# Patient Record
Sex: Male | Born: 1945
Health system: Southern US, Community
[De-identification: ages and names within clinical notes are randomized; demographics above are authoritative.]

## PROBLEM LIST (undated history)

## (undated) DIAGNOSIS — M7989 Other specified soft tissue disorders: Secondary | ICD-10-CM

## (undated) DIAGNOSIS — R0602 Shortness of breath: Secondary | ICD-10-CM

## (undated) DIAGNOSIS — I10 Essential (primary) hypertension: Secondary | ICD-10-CM

## (undated) DIAGNOSIS — M25551 Pain in right hip: Secondary | ICD-10-CM

## (undated) DIAGNOSIS — E119 Type 2 diabetes mellitus without complications: Secondary | ICD-10-CM

## (undated) DIAGNOSIS — T8859XA Other complications of anesthesia, initial encounter: Secondary | ICD-10-CM

## (undated) DIAGNOSIS — I499 Cardiac arrhythmia, unspecified: Secondary | ICD-10-CM

## (undated) DIAGNOSIS — R609 Edema, unspecified: Secondary | ICD-10-CM

## (undated) DIAGNOSIS — M1990 Unspecified osteoarthritis, unspecified site: Secondary | ICD-10-CM

## (undated) DIAGNOSIS — M109 Gout, unspecified: Secondary | ICD-10-CM

## (undated) DIAGNOSIS — I491 Atrial premature depolarization: Secondary | ICD-10-CM

## (undated) DIAGNOSIS — M75100 Unspecified rotator cuff tear or rupture of unspecified shoulder, not specified as traumatic: Secondary | ICD-10-CM

## (undated) DIAGNOSIS — I2510 Atherosclerotic heart disease of native coronary artery without angina pectoris: Secondary | ICD-10-CM

## (undated) DIAGNOSIS — G8929 Other chronic pain: Secondary | ICD-10-CM

## (undated) DIAGNOSIS — T4145XA Adverse effect of unspecified anesthetic, initial encounter: Secondary | ICD-10-CM

## (undated) DIAGNOSIS — E1165 Type 2 diabetes mellitus with hyperglycemia: Secondary | ICD-10-CM

## (undated) DIAGNOSIS — E785 Hyperlipidemia, unspecified: Secondary | ICD-10-CM

## (undated) HISTORY — DX: Pain in right hip: M25.551

## (undated) HISTORY — DX: Adverse effect of unspecified anesthetic, initial encounter: T41.45XA

## (undated) HISTORY — DX: Type 2 diabetes mellitus without complications: E11.9

## (undated) HISTORY — PX: BACK SURGERY: SHX140

## (undated) HISTORY — DX: Atrial premature depolarization: I49.1

## (undated) HISTORY — DX: Other complications of anesthesia, initial encounter: T88.59XA

## (undated) HISTORY — PX: ELBOW BURSA SURGERY: SHX615

## (undated) HISTORY — DX: Essential (primary) hypertension: I10

## (undated) HISTORY — PX: CARDIAC CATHETERIZATION: SHX172

## (undated) HISTORY — DX: Edema, unspecified: R60.9

## (undated) HISTORY — DX: Other chronic pain: G89.29

## (undated) HISTORY — DX: Other specified soft tissue disorders: M79.89

## (undated) HISTORY — DX: Type 2 diabetes mellitus with hyperglycemia: E11.65

## (undated) HISTORY — DX: Gout, unspecified: M10.9

## (undated) HISTORY — DX: Hyperlipidemia, unspecified: E78.5

---

## 1999-11-13 ENCOUNTER — Ambulatory Visit (HOSPITAL_COMMUNITY): Admission: RE | Admit: 1999-11-13 | Discharge: 1999-11-13 | Payer: Self-pay | Admitting: Internal Medicine

## 2000-03-25 ENCOUNTER — Other Ambulatory Visit: Admission: RE | Admit: 2000-03-25 | Discharge: 2000-03-25 | Payer: Self-pay | Admitting: Obstetrics and Gynecology

## 2000-06-17 ENCOUNTER — Other Ambulatory Visit: Admission: RE | Admit: 2000-06-17 | Discharge: 2000-06-17 | Payer: Self-pay | Admitting: *Deleted

## 2001-07-09 ENCOUNTER — Inpatient Hospital Stay (HOSPITAL_COMMUNITY): Admission: EM | Admit: 2001-07-09 | Discharge: 2001-07-11 | Payer: Self-pay

## 2001-09-08 ENCOUNTER — Encounter: Payer: Self-pay | Admitting: Internal Medicine

## 2001-09-08 ENCOUNTER — Encounter: Admission: RE | Admit: 2001-09-08 | Discharge: 2001-09-08 | Payer: Self-pay | Admitting: Obstetrics and Gynecology

## 2002-01-22 ENCOUNTER — Ambulatory Visit (HOSPITAL_COMMUNITY): Admission: RE | Admit: 2002-01-22 | Discharge: 2002-01-22 | Payer: Self-pay | Admitting: Internal Medicine

## 2005-10-16 ENCOUNTER — Ambulatory Visit: Payer: Self-pay

## 2005-11-22 ENCOUNTER — Ambulatory Visit: Payer: Self-pay | Admitting: Cardiology

## 2007-06-03 ENCOUNTER — Ambulatory Visit: Payer: Self-pay | Admitting: Gastroenterology

## 2007-06-17 ENCOUNTER — Encounter: Payer: Self-pay | Admitting: Gastroenterology

## 2007-06-17 ENCOUNTER — Ambulatory Visit: Payer: Self-pay | Admitting: Gastroenterology

## 2011-03-22 NOTE — Discharge Summary (Signed)
[name]. [facility]  Patient:    Oscar [name], Oscar [name] Visit Number: [id number] MRN: [id number]          Service Type: MED Location: [phone] Attending Physician:  Learta Codding Dictated by:   Abelino Derrick, P.A.C. [facility] Admit Date:  07/09/2001 Discharge Date: 07/11/2001   CC:         Erskine Speed, M.D.   Referring Physician Discharge [place]  DISCHARGE DIAGNOSES: 1. Acute coronary syndrome, catheterization this admission. 2. Treated hypertension. 3. Non-insulin-dependent diabetes. 4. Treated hyperlipidemia. 5. History of gout.  HOSPITAL COURSE:  The patient is a 65 year old male with no prior history of coronary disease who presented July 09, 2001 with chest pain consistent with acute coronary syndrome.  Troponin was slightly positive at 0.22; CKs were 273 with 4.7 MBs.  Patient was admitted to telemetry, started on Protonix, aspirin, Lovenox, and beta blocker, and set up for a catheterization.  This was done July 10, 2001 by Dr. Gerri Spore.  This revealed a 50-60% RCA, 40% circumflex, 40% LAD.  LV function was normal.  Dr. Gerri Spore felt he had moderate diffuse coronary disease with no obstruction, no culprit lesions identified.  Plan is for aggressive medical therapy.  On September 7 he is up and ambulating, pain free.  His troponin is 0.1.  He will be discharged today and follow up with Dr. Andee Lineman in the office in about two weeks.  DISCHARGE MEDICATIONS: 1. Glucophage 500 mg b.i.d. to start Sunday. 2. Lopressor 25 mg b.i.d. 3. Protonix 40 mg a day. 4. Colchicine 0.6 q.d. 5. Coated aspirin q.d. 6. Altace 2.5 q.d. 7. Lipitor 20 mg a day. 8. Nitroglycerin sublingual p.r.n. 9. Plavix - duration to be determined by Dr. Andee Lineman.  LABORATORY DATA:  CK-MB and troponins are as noted.  Sodium 141, potassium 3.7, BUN 10, creatinine 1.2.  D-dimer 0.27.  INR 1.1.  White count 4.9, hemoglobin 16.3, hematocrit 48.2, platelets 166.  EKG shows sinus  rhythm, left anterior fascicular block, nonspecific changes.  DISPOSITION:  Patient is discharged in stable condition and will follow up with Dr. Andee Lineman. Dictated by:   Abelino Derrick, P.A.C. [facility] Attending Physician:  Learta Codding DD:  07/11/01 TD:  07/11/01 Job: [id number]

## 2011-03-22 NOTE — Cardiovascular Report (Signed)
[place]. [facility]  Patient:    Oscar [name], Oscar [name] Visit Number: [id number] MRN: [id number]          Service Type: MED Location: [phone] Attending Physician:  Learta Codding Dictated by:   Daisey Must, M.D. [facility] Proc. Date: 07/10/01 Admit Date:  07/09/2001   CC:         Erskine Speed, M.D.  Lewayne Bunting, M.D. [facility]  Cardiac Catheterization Laboratory   Cardiac Catheterization  PROCEDURES PERFORMED:  Left heart catheterization with coronary angiography and left ventriculography.  INDICATIONS:  The patient is a 65 year old male with diabetes and hypercholesterolemia.  He presented to the hospital with chest pain.  His cardiac enzymes showed borderline elevation troponin levels. He was referred for cardiac catheterization.  DESCRIPTION OF PROCEDURE:  A 6 French sheath was placed in the right femoral artery.  Standard Judkins 6 French catheters were utilized.  Contrast was Omnipaque.  There were no complications.  RESULTS:  HEMODYNAMICS:  Left ventricular pressure 122/16.  Aortic pressure 122/88. There was no aortic valve gradient.  LEFT VENTRICULOGRAM:  Wall motion is normal.  Ejection fraction was calculated at 63%.  There is no mitral regurgitation.  CORONARY ARTERIOGRAPHY:  (Codominant).  Left main:  Normal.  Left anterior descending:  The left anterior descending artery has a 30% stenosis at the ostium.  In the mid vessel just after the origin of the large first diagonal branch there is a 50% stenosis.  Further down in the mid vessel is a tubular 40% stenosis.  The distal LAD has a diffuse 25% stenosis and the apical LAD had a 70% stenosis.  There is a large first diagonal branch which has a 40% stenosis at its origin and a 50% stenosis in a small branch.  Left circumflex:  The left circumflex is a codominant vessel.  There is a 40% stenosis in the mid vessel, 40% in the mid to distal vessel, and a diffuse 20% stenosis in the very  distal vessel before the second posterolateral branch. There is a small OM-1, normal sized OM-2 and normal sized OM-3 arising from the circumflex.  OM-2 has a 30% stenosis proximally.  OM-3 has a 50% stenosis at its origin.  The distal left circumflex gives rise to two small to normal sized posterolateral branches.  Right coronary artery:  The right coronary artery is a codominant vessel. There is a diffuse 50% stenosis in the mid vessel and a tubular 60% stenosis in the distal vessel.  The distal right coronary artery gives rise to a normal sized posterior descending artery.  Of note, there was somewhat delayed antegrade flow in both left and right coronary system.  Intracoronary verapamil was administered in the right coronary artery which did improve flow in the mid to distal right coronary artery.  This suggest a possibility of small vessel disease.  IMPRESSIONS: 1. Normal left ventricular systolic function. 2. Moderate two-vessel coronary artery disease as described with diffusely    disease coronaries but no clearly obstructive lesions identified.  RECOMMENDATIONS:  Medical therapy with aggressive risk factor modification. Dictated by:   Daisey Must, M.D. [facility] Attending Physician:  Learta Codding DD:  07/10/01 TD:  07/11/01 Job: [id number]

## 2012-07-07 ENCOUNTER — Ambulatory Visit
Admission: RE | Admit: 2012-07-07 | Discharge: 2012-07-07 | Disposition: A | Discharge status: Home / Self Care | Payer: BC Managed Care – PPO | Source: Ambulatory Visit | Attending: Orthopedic Surgery | Admitting: Orthopedic Surgery

## 2012-07-07 ENCOUNTER — Other Ambulatory Visit: Payer: Self-pay | Admitting: Orthopedic Surgery

## 2012-07-07 DIAGNOSIS — M25551 Pain in right hip: Secondary | ICD-10-CM

## 2012-07-07 DIAGNOSIS — M5430 Sciatica, unspecified side: Secondary | ICD-10-CM

## 2012-07-17 ENCOUNTER — Other Ambulatory Visit: Payer: Self-pay | Admitting: Orthopedic Surgery

## 2012-07-17 DIAGNOSIS — M545 Low back pain, unspecified: Secondary | ICD-10-CM

## 2012-07-23 ENCOUNTER — Ambulatory Visit
Admission: RE | Admit: 2012-07-23 | Discharge: 2012-07-23 | Disposition: A | Discharge status: Home / Self Care | Payer: BC Managed Care – PPO | Source: Ambulatory Visit | Attending: Orthopedic Surgery | Admitting: Orthopedic Surgery

## 2012-07-23 DIAGNOSIS — M545 Low back pain, unspecified: Secondary | ICD-10-CM

## 2012-08-12 ENCOUNTER — Encounter: Payer: Self-pay | Admitting: Gastroenterology

## 2012-08-18 ENCOUNTER — Other Ambulatory Visit: Payer: Self-pay | Admitting: Orthopedic Surgery

## 2012-08-18 DIAGNOSIS — M549 Dorsalgia, unspecified: Secondary | ICD-10-CM

## 2012-08-19 ENCOUNTER — Other Ambulatory Visit: Payer: Self-pay | Admitting: Orthopedic Surgery

## 2012-08-19 ENCOUNTER — Other Ambulatory Visit: Payer: BC Managed Care – PPO

## 2012-08-19 ENCOUNTER — Ambulatory Visit
Admission: RE | Admit: 2012-08-19 | Discharge: 2012-08-19 | Disposition: A | Discharge status: Home / Self Care | Payer: BC Managed Care – PPO | Source: Ambulatory Visit | Attending: Orthopedic Surgery | Admitting: Orthopedic Surgery

## 2012-08-19 VITALS — BP 157/74 | HR 82 | Ht 72.0 in | Wt 285.0 lb

## 2012-08-19 DIAGNOSIS — M549 Dorsalgia, unspecified: Secondary | ICD-10-CM

## 2012-08-19 DIAGNOSIS — M5126 Other intervertebral disc displacement, lumbar region: Secondary | ICD-10-CM

## 2012-08-19 MED ORDER — METHYLPREDNISOLONE ACETATE 40 MG/ML INJ SUSP (RADIOLOG
120.0000 mg | Freq: Once | INTRAMUSCULAR | Status: AC
  Administered 2012-08-19: 120 mg via EPIDURAL

## 2012-08-19 MED ORDER — IOHEXOL 180 MG/ML  SOLN
1.0000 mL | Freq: Once | INTRAMUSCULAR | Status: AC | PRN
  Administered 2012-08-19: 1 mL via EPIDURAL

## 2012-08-26 ENCOUNTER — Ambulatory Visit: Payer: BC Managed Care – PPO | Admitting: Physical Therapy

## 2012-09-14 ENCOUNTER — Other Ambulatory Visit: Payer: Self-pay | Admitting: Orthopedic Surgery

## 2012-09-14 DIAGNOSIS — M5126 Other intervertebral disc displacement, lumbar region: Secondary | ICD-10-CM

## 2012-09-14 DIAGNOSIS — M5430 Sciatica, unspecified side: Secondary | ICD-10-CM

## 2012-09-16 ENCOUNTER — Ambulatory Visit
Admission: RE | Admit: 2012-09-16 | Discharge: 2012-09-16 | Disposition: A | Discharge status: Home / Self Care | Payer: BC Managed Care – PPO | Source: Ambulatory Visit | Attending: Orthopedic Surgery | Admitting: Orthopedic Surgery

## 2012-09-16 VITALS — BP 153/92 | HR 75

## 2012-09-16 DIAGNOSIS — M5126 Other intervertebral disc displacement, lumbar region: Secondary | ICD-10-CM

## 2012-09-16 DIAGNOSIS — M5430 Sciatica, unspecified side: Secondary | ICD-10-CM

## 2012-09-16 MED ORDER — METHYLPREDNISOLONE ACETATE 40 MG/ML INJ SUSP (RADIOLOG
120.0000 mg | Freq: Once | INTRAMUSCULAR | Status: AC
  Administered 2012-09-16: 120 mg via EPIDURAL

## 2012-09-16 MED ORDER — IOHEXOL 180 MG/ML  SOLN
1.0000 mL | Freq: Once | INTRAMUSCULAR | Status: AC | PRN
  Administered 2012-09-16: 1 mL via EPIDURAL

## 2012-09-30 ENCOUNTER — Encounter: Payer: Self-pay | Admitting: Gastroenterology

## 2012-11-10 ENCOUNTER — Ambulatory Visit (AMBULATORY_SURGERY_CENTER): Payer: BC Managed Care – PPO

## 2012-11-10 VITALS — Ht 72.05 in | Wt 304.6 lb

## 2012-11-10 DIAGNOSIS — Z86010 Personal history of colonic polyps: Secondary | ICD-10-CM

## 2012-11-10 DIAGNOSIS — Z1211 Encounter for screening for malignant neoplasm of colon: Secondary | ICD-10-CM

## 2012-11-10 MED ORDER — NA SULFATE-K SULFATE-MG SULF 17.5-3.13-1.6 GM/177ML PO SOLN: 1.0000 | Freq: Once | ORAL | Status: DC

## 2012-11-11 ENCOUNTER — Encounter: Payer: Self-pay | Admitting: Gastroenterology

## 2012-11-13 ENCOUNTER — Encounter (HOSPITAL_COMMUNITY): Payer: Self-pay | Admitting: Pharmacy Technician

## 2012-11-13 ENCOUNTER — Other Ambulatory Visit: Payer: Self-pay | Admitting: Neurosurgery

## 2012-11-18 ENCOUNTER — Encounter (HOSPITAL_COMMUNITY)
Admission: RE | Admit: 2012-11-18 | Discharge: 2012-11-18 | Disposition: A | Discharge status: Home / Self Care | Payer: BC Managed Care – PPO | Source: Ambulatory Visit | Attending: Anesthesiology | Admitting: Anesthesiology

## 2012-11-18 ENCOUNTER — Encounter (HOSPITAL_COMMUNITY): Payer: Self-pay

## 2012-11-18 ENCOUNTER — Encounter (HOSPITAL_COMMUNITY)
Admission: RE | Admit: 2012-11-18 | Discharge: 2012-11-18 | Disposition: A | Discharge status: Home / Self Care | Payer: BC Managed Care – PPO | Source: Ambulatory Visit | Attending: Neurosurgery | Admitting: Neurosurgery

## 2012-11-18 HISTORY — DX: Cardiac arrhythmia, unspecified: I49.9

## 2012-11-18 HISTORY — DX: Shortness of breath: R06.02

## 2012-11-18 HISTORY — DX: Unspecified osteoarthritis, unspecified site: M19.90

## 2012-11-18 LAB — BASIC METABOLIC PANEL
CO2: 26 mEq/L (ref 19–32)
Chloride: 103 mEq/L (ref 96–112)
Creatinine, Ser: 0.88 mg/dL (ref 0.50–1.35)
Glucose, Bld: 171 mg/dL — ABNORMAL HIGH (ref 70–99)
Sodium: 141 mEq/L (ref 135–145)

## 2012-11-18 LAB — TYPE AND SCREEN
ABO/RH(D): O POS
Antibody Screen: NEGATIVE

## 2012-11-18 LAB — ABO/RH: ABO/RH(D): O POS

## 2012-11-18 LAB — CBC
HCT: 44.7 % (ref 39.0–52.0)
MCH: 32.2 pg (ref 26.0–34.0)
MCV: 92.9 fL (ref 78.0–100.0)
RBC: 4.81 MIL/uL (ref 4.22–5.81)
WBC: 4.8 10*3/uL (ref 4.0–10.5)

## 2012-11-18 LAB — SURGICAL PCR SCREEN
MRSA, PCR: NEGATIVE
Staphylococcus aureus: NEGATIVE

## 2012-11-18 MED ORDER — DEXTROSE 5 % IV SOLN
3.0000 g | INTRAVENOUS | Status: AC
  Administered 2012-11-19: 2 g via INTRAVENOUS
  Administered 2012-11-19: 3 g via INTRAVENOUS
  Filled 2012-11-18: qty 3000

## 2012-11-18 NOTE — Pre-Procedure Instructions (Signed)
Oscar [name]  11/18/2012   Your procedure is scheduled on:  Thursday November 19, 2012  Report to [facility] at 5:45 AM.  Call this number if you have problems the morning of surgery: [phone]   Remember:   Do not eat food or drink liquids after midnight.   Take these medicines the morning of surgery with A SIP OF WATER: allopurinol, colchicine, dexamethasone (if needed),    Do not wear jewelry, make-up or nail polish.  Do not wear lotions, powders, or perfumes.  Do not shave 48 hours prior to surgery. Men may shave face and neck.  Do not bring valuables to the hospital.  Contacts, dentures or bridgework may not be worn into surgery.  Leave suitcase in the car. After surgery it may be brought to your room.  For patients admitted to the hospital, checkout time is 11:00 AM the day of  discharge.   Patients discharged the day of surgery will not be allowed to drive  home.  Name and phone number of your driver: family/ friend  Special Instructions: Shower using CHG 2 nights before surgery and the night before surgery.  If you shower the day of surgery use CHG.  Use special wash - you have one bottle of CHG for all showers.  You should use approximately 1/3 of the bottle for each shower.   Please read over the following fact sheets that you were given: Pain Booklet, Coughing and Deep Breathing, Blood Transfusion Information, MRSA Information and Surgical Site Infection Prevention

## 2012-11-18 NOTE — Progress Notes (Addendum)
Pt. followed by Nila Nephew, PCP, [phone], pt. reports that he was last seen by [facility] after heart cath, several yrs. Ago.

## 2012-11-19 ENCOUNTER — Encounter (HOSPITAL_COMMUNITY): Payer: Self-pay | Admitting: Certified Registered Nurse Anesthetist

## 2012-11-19 ENCOUNTER — Ambulatory Visit (HOSPITAL_COMMUNITY): Payer: BC Managed Care – PPO

## 2012-11-19 ENCOUNTER — Encounter (HOSPITAL_COMMUNITY)
Admission: RE | Disposition: A | Discharge status: Home / Self Care | Payer: Self-pay | Source: Ambulatory Visit | Attending: Neurosurgery

## 2012-11-19 ENCOUNTER — Encounter (HOSPITAL_COMMUNITY): Payer: Self-pay | Admitting: Surgery

## 2012-11-19 ENCOUNTER — Observation Stay (HOSPITAL_COMMUNITY)
Admission: RE | Admit: 2012-11-19 | Discharge: 2012-11-20 | Disposition: A | Discharge status: Home / Self Care | Payer: BC Managed Care – PPO | Source: Ambulatory Visit | Attending: Neurosurgery | Admitting: Neurosurgery

## 2012-11-19 ENCOUNTER — Ambulatory Visit (HOSPITAL_COMMUNITY): Payer: BC Managed Care – PPO | Admitting: Certified Registered Nurse Anesthetist

## 2012-11-19 DIAGNOSIS — G8929 Other chronic pain: Secondary | ICD-10-CM | POA: Insufficient documentation

## 2012-11-19 DIAGNOSIS — Z01812 Encounter for preprocedural laboratory examination: Secondary | ICD-10-CM | POA: Insufficient documentation

## 2012-11-19 DIAGNOSIS — Q762 Congenital spondylolisthesis: Secondary | ICD-10-CM | POA: Insufficient documentation

## 2012-11-19 DIAGNOSIS — Z7982 Long term (current) use of aspirin: Secondary | ICD-10-CM | POA: Insufficient documentation

## 2012-11-19 DIAGNOSIS — M51379 Other intervertebral disc degeneration, lumbosacral region without mention of lumbar back pain or lower extremity pain: Secondary | ICD-10-CM | POA: Insufficient documentation

## 2012-11-19 DIAGNOSIS — M47817 Spondylosis without myelopathy or radiculopathy, lumbosacral region: Principal | ICD-10-CM | POA: Insufficient documentation

## 2012-11-19 DIAGNOSIS — M5137 Other intervertebral disc degeneration, lumbosacral region: Secondary | ICD-10-CM | POA: Insufficient documentation

## 2012-11-19 DIAGNOSIS — Z79899 Other long term (current) drug therapy: Secondary | ICD-10-CM | POA: Insufficient documentation

## 2012-11-19 DIAGNOSIS — E785 Hyperlipidemia, unspecified: Secondary | ICD-10-CM | POA: Insufficient documentation

## 2012-11-19 DIAGNOSIS — M109 Gout, unspecified: Secondary | ICD-10-CM | POA: Insufficient documentation

## 2012-11-19 DIAGNOSIS — E119 Type 2 diabetes mellitus without complications: Secondary | ICD-10-CM | POA: Insufficient documentation

## 2012-11-19 DIAGNOSIS — Z01818 Encounter for other preprocedural examination: Secondary | ICD-10-CM | POA: Insufficient documentation

## 2012-11-19 DIAGNOSIS — I10 Essential (primary) hypertension: Secondary | ICD-10-CM | POA: Insufficient documentation

## 2012-11-19 LAB — GLUCOSE, CAPILLARY
Glucose-Capillary: 140 mg/dL — ABNORMAL HIGH (ref 70–99)
Glucose-Capillary: 220 mg/dL — ABNORMAL HIGH (ref 70–99)
Glucose-Capillary: 223 mg/dL — ABNORMAL HIGH (ref 70–99)
Glucose-Capillary: 224 mg/dL — ABNORMAL HIGH (ref 70–99)

## 2012-11-19 SURGERY — POSTERIOR LUMBAR FUSION 1 LEVEL
Anesthesia: General | Site: Back | Laterality: Bilateral | Wound class: Clean

## 2012-11-19 MED ORDER — 0.9 % SODIUM CHLORIDE (POUR BTL) OPTIME
TOPICAL | Status: DC | PRN
  Administered 2012-11-19: 1000 mL

## 2012-11-19 MED ORDER — ETOMIDATE 2 MG/ML IV SOLN
INTRAVENOUS | Status: DC | PRN
  Administered 2012-11-19: 20 mg via INTRAVENOUS

## 2012-11-19 MED ORDER — SIMVASTATIN 20 MG PO TABS
20.0000 mg | ORAL_TABLET | Freq: Every day | ORAL | Status: DC
  Administered 2012-11-19 – 2012-11-20 (×2): 20 mg via ORAL
  Filled 2012-11-19 (×2): qty 1

## 2012-11-19 MED ORDER — SODIUM CHLORIDE 0.9 % IV SOLN
INTRAVENOUS | Status: AC
  Filled 2012-11-19: qty 500

## 2012-11-19 MED ORDER — ALLOPURINOL 300 MG PO TABS
300.0000 mg | ORAL_TABLET | Freq: Every day | ORAL | Status: DC
  Administered 2012-11-20: 300 mg via ORAL
  Filled 2012-11-19 (×3): qty 1

## 2012-11-19 MED ORDER — LIDOCAINE HCL (CARDIAC) 20 MG/ML IV SOLN
INTRAVENOUS | Status: DC | PRN
  Administered 2012-11-19: 100 mg via INTRAVENOUS

## 2012-11-19 MED ORDER — ACETAMINOPHEN 10 MG/ML IV SOLN
1000.0000 mg | Freq: Four times a day (QID) | INTRAVENOUS | Status: AC
  Administered 2012-11-19 – 2012-11-20 (×4): 1000 mg via INTRAVENOUS
  Filled 2012-11-19 (×4): qty 100

## 2012-11-19 MED ORDER — METOCLOPRAMIDE HCL 5 MG/ML IJ SOLN: 10.0000 mg | Freq: Once | INTRAMUSCULAR | Status: DC | PRN

## 2012-11-19 MED ORDER — KETOROLAC TROMETHAMINE 30 MG/ML IJ SOLN
30.0000 mg | Freq: Four times a day (QID) | INTRAMUSCULAR | Status: DC
  Administered 2012-11-19 – 2012-11-20 (×4): 30 mg via INTRAVENOUS
  Filled 2012-11-19 (×6): qty 1

## 2012-11-19 MED ORDER — MORPHINE SULFATE 4 MG/ML IJ SOLN: 4.0000 mg | INTRAMUSCULAR | Status: DC | PRN

## 2012-11-19 MED ORDER — SODIUM CHLORIDE 0.9 % IJ SOLN: 3.0000 mL | INTRAMUSCULAR | Status: DC | PRN

## 2012-11-19 MED ORDER — GLYCOPYRROLATE 0.2 MG/ML IJ SOLN
INTRAMUSCULAR | Status: DC | PRN
  Administered 2012-11-19: .8 mg via INTRAVENOUS

## 2012-11-19 MED ORDER — THROMBIN 20000 UNITS EX SOLR
CUTANEOUS | Status: DC | PRN
  Administered 2012-11-19: 09:00:00 via TOPICAL

## 2012-11-19 MED ORDER — MIDAZOLAM HCL 5 MG/5ML IJ SOLN
INTRAMUSCULAR | Status: DC | PRN
  Administered 2012-11-19: 2 mg via INTRAVENOUS

## 2012-11-19 MED ORDER — MENTHOL 3 MG MT LOZG: 1.0000 | LOZENGE | OROMUCOSAL | Status: DC | PRN

## 2012-11-19 MED ORDER — CEFAZOLIN SODIUM-DEXTROSE 2-3 GM-% IV SOLR
INTRAVENOUS | Status: AC
  Filled 2012-11-19: qty 50

## 2012-11-19 MED ORDER — SUCCINYLCHOLINE CHLORIDE 20 MG/ML IJ SOLN
INTRAMUSCULAR | Status: DC | PRN
  Administered 2012-11-19: 160 mg via INTRAVENOUS

## 2012-11-19 MED ORDER — ARTIFICIAL TEARS OP OINT
TOPICAL_OINTMENT | OPHTHALMIC | Status: DC | PRN
  Administered 2012-11-19: 1 via OPHTHALMIC

## 2012-11-19 MED ORDER — PHENYLEPHRINE HCL 10 MG/ML IJ SOLN
10.0000 mg | INTRAVENOUS | Status: DC | PRN
  Administered 2012-11-19: 25 ug/min via INTRAVENOUS

## 2012-11-19 MED ORDER — HYDROMORPHONE HCL PF 1 MG/ML IJ SOLN
0.2500 mg | INTRAMUSCULAR | Status: DC | PRN
  Administered 2012-11-19 (×2): 0.5 mg via INTRAVENOUS

## 2012-11-19 MED ORDER — PHENYLEPHRINE HCL 10 MG/ML IJ SOLN
INTRAMUSCULAR | Status: DC | PRN
  Administered 2012-11-19 (×2): 40 ug via INTRAVENOUS
  Administered 2012-11-19: 80 ug via INTRAVENOUS

## 2012-11-19 MED ORDER — COLCHICINE 0.6 MG PO TABS
0.6000 mg | ORAL_TABLET | Freq: Every day | ORAL | Status: DC
  Administered 2012-11-20: 0.6 mg via ORAL
  Filled 2012-11-19 (×3): qty 1

## 2012-11-19 MED ORDER — LIDOCAINE-EPINEPHRINE 1 %-1:100000 IJ SOLN
INTRAMUSCULAR | Status: DC | PRN
  Administered 2012-11-19: 15 mL

## 2012-11-19 MED ORDER — LOSARTAN POTASSIUM 50 MG PO TABS
50.0000 mg | ORAL_TABLET | Freq: Every day | ORAL | Status: DC
  Administered 2012-11-19 – 2012-11-20 (×2): 50 mg via ORAL
  Filled 2012-11-19 (×3): qty 1

## 2012-11-19 MED ORDER — BUPIVACAINE HCL (PF) 0.5 % IJ SOLN
INTRAMUSCULAR | Status: DC | PRN
  Administered 2012-11-19: 15 mL

## 2012-11-19 MED ORDER — MAGNESIUM HYDROXIDE 400 MG/5ML PO SUSP: 30.0000 mL | Freq: Every day | ORAL | Status: DC | PRN

## 2012-11-19 MED ORDER — OXYCODONE HCL 5 MG/5ML PO SOLN: 5.0000 mg | Freq: Once | ORAL | Status: DC | PRN

## 2012-11-19 MED ORDER — BISACODYL 10 MG RE SUPP: 10.0000 mg | Freq: Every day | RECTAL | Status: DC | PRN

## 2012-11-19 MED ORDER — BACITRACIN 50000 UNITS IM SOLR
INTRAMUSCULAR | Status: AC
  Filled 2012-11-19: qty 1

## 2012-11-19 MED ORDER — ROCURONIUM BROMIDE 100 MG/10ML IV SOLN
INTRAVENOUS | Status: DC | PRN
  Administered 2012-11-19: 50 mg via INTRAVENOUS

## 2012-11-19 MED ORDER — KETOROLAC TROMETHAMINE 30 MG/ML IJ SOLN
30.0000 mg | Freq: Once | INTRAMUSCULAR | Status: AC
  Administered 2012-11-19: 30 mg via INTRAVENOUS

## 2012-11-19 MED ORDER — POTASSIUM CHLORIDE IN NACL 20-0.9 MEQ/L-% IV SOLN
INTRAVENOUS | Status: DC
  Administered 2012-11-19: 23:00:00 via INTRAVENOUS
  Filled 2012-11-19 (×6): qty 1000

## 2012-11-19 MED ORDER — FENTANYL CITRATE 0.05 MG/ML IJ SOLN
INTRAMUSCULAR | Status: DC | PRN
  Administered 2012-11-19: 50 ug via INTRAVENOUS
  Administered 2012-11-19: 25 ug via INTRAVENOUS
  Administered 2012-11-19 (×2): 50 ug via INTRAVENOUS
  Administered 2012-11-19 (×3): 25 ug via INTRAVENOUS

## 2012-11-19 MED ORDER — HYDROMORPHONE HCL PF 1 MG/ML IJ SOLN
INTRAMUSCULAR | Status: AC
  Filled 2012-11-19: qty 1

## 2012-11-19 MED ORDER — VECURONIUM BROMIDE 10 MG IV SOLR
INTRAVENOUS | Status: DC | PRN
  Administered 2012-11-19: 3 mg via INTRAVENOUS
  Administered 2012-11-19 (×4): 1 mg via INTRAVENOUS

## 2012-11-19 MED ORDER — DEXAMETHASONE SODIUM PHOSPHATE 4 MG/ML IJ SOLN
INTRAMUSCULAR | Status: DC | PRN
  Administered 2012-11-19: 4 mg via INTRAVENOUS

## 2012-11-19 MED ORDER — PHENOL 1.4 % MT LIQD: 1.0000 | OROMUCOSAL | Status: DC | PRN

## 2012-11-19 MED ORDER — NEOSTIGMINE METHYLSULFATE 1 MG/ML IJ SOLN
INTRAMUSCULAR | Status: DC | PRN
  Administered 2012-11-19: 5 mg via INTRAVENOUS

## 2012-11-19 MED ORDER — VALACYCLOVIR HCL 500 MG PO TABS
500.0000 mg | ORAL_TABLET | Freq: Every day | ORAL | Status: DC
  Administered 2012-11-19 – 2012-11-20 (×2): 500 mg via ORAL
  Filled 2012-11-19 (×2): qty 1

## 2012-11-19 MED ORDER — ALUM & MAG HYDROXIDE-SIMETH 200-200-20 MG/5ML PO SUSP: 30.0000 mL | Freq: Four times a day (QID) | ORAL | Status: DC | PRN

## 2012-11-19 MED ORDER — HYDROXYZINE HCL 25 MG PO TABS
50.0000 mg | ORAL_TABLET | ORAL | Status: DC | PRN
  Administered 2012-11-19: 50 mg via ORAL
  Filled 2012-11-19: qty 2

## 2012-11-19 MED ORDER — ACETAMINOPHEN 325 MG PO TABS: 650.0000 mg | ORAL_TABLET | ORAL | Status: DC | PRN

## 2012-11-19 MED ORDER — ZOLPIDEM TARTRATE 5 MG PO TABS: 5.0000 mg | ORAL_TABLET | Freq: Every evening | ORAL | Status: DC | PRN

## 2012-11-19 MED ORDER — SODIUM CHLORIDE 0.9 % IV SOLN
INTRAVENOUS | Status: DC | PRN
  Administered 2012-11-19: 12:00:00 via INTRAVENOUS

## 2012-11-19 MED ORDER — SODIUM CHLORIDE 0.9 % IR SOLN
Status: DC | PRN
  Administered 2012-11-19: 09:00:00

## 2012-11-19 MED ORDER — LACTATED RINGERS IV SOLN
INTRAVENOUS | Status: DC | PRN
  Administered 2012-11-19 (×3): via INTRAVENOUS

## 2012-11-19 MED ORDER — KETOROLAC TROMETHAMINE 30 MG/ML IJ SOLN
INTRAMUSCULAR | Status: AC
  Filled 2012-11-19: qty 1

## 2012-11-19 MED ORDER — ACETAMINOPHEN 10 MG/ML IV SOLN
INTRAVENOUS | Status: AC
  Filled 2012-11-19: qty 100

## 2012-11-19 MED ORDER — ACETAMINOPHEN 650 MG RE SUPP: 650.0000 mg | RECTAL | Status: DC | PRN

## 2012-11-19 MED ORDER — SODIUM CHLORIDE 0.9 % IJ SOLN
3.0000 mL | Freq: Two times a day (BID) | INTRAMUSCULAR | Status: DC
  Administered 2012-11-19 – 2012-11-20 (×2): 3 mL via INTRAVENOUS

## 2012-11-19 MED ORDER — ONDANSETRON HCL 4 MG/2ML IJ SOLN
INTRAMUSCULAR | Status: DC | PRN
  Administered 2012-11-19: 4 mg via INTRAVENOUS

## 2012-11-19 MED ORDER — CYCLOBENZAPRINE HCL 10 MG PO TABS: 10.0000 mg | ORAL_TABLET | Freq: Three times a day (TID) | ORAL | Status: DC | PRN

## 2012-11-19 MED ORDER — HYDROXYZINE HCL 50 MG/ML IM SOLN: 50.0000 mg | INTRAMUSCULAR | Status: DC | PRN

## 2012-11-19 MED ORDER — METFORMIN HCL 500 MG PO TABS
500.0000 mg | ORAL_TABLET | Freq: Two times a day (BID) | ORAL | Status: DC
  Administered 2012-11-19 – 2012-11-20 (×3): 500 mg via ORAL
  Filled 2012-11-19 (×5): qty 1

## 2012-11-19 MED ORDER — OXYCODONE HCL 5 MG PO TABS
5.0000 mg | ORAL_TABLET | ORAL | Status: DC | PRN
  Administered 2012-11-19 – 2012-11-20 (×3): 10 mg via ORAL
  Filled 2012-11-19 (×3): qty 2

## 2012-11-19 SURGICAL SUPPLY — 76 items
ADH SKN CLS APL DERMABOND .7 (GAUZE/BANDAGES/DRESSINGS) ×1
BAG DECANTER FOR FLEXI CONT (MISCELLANEOUS) ×2 IMPLANT
BLADE SURG ROTATE 9660 (MISCELLANEOUS) IMPLANT
BRUSH SCRUB EZ PLAIN DRY (MISCELLANEOUS) ×2 IMPLANT
BUR ACORN 6.0 ACORN (BURR) ×1 IMPLANT
BUR ACRON 5.0MM COATED (BURR) ×2 IMPLANT
BUR MATCHSTICK NEURO 3.0 LAGG (BURR) ×2 IMPLANT
CANISTER SUCTION 2500CC (MISCELLANEOUS) ×2 IMPLANT
CAP LCK SPNE (Orthopedic Implant) ×4 IMPLANT
CAP LOCK SPINE RADIUS (Orthopedic Implant) ×4 IMPLANT
CAP LOCKING (Orthopedic Implant) ×8 IMPLANT
CLOTH BEACON ORANGE TIMEOUT ST (SAFETY) ×2 IMPLANT
CONT SPEC 4OZ CLIKSEAL STRL BL (MISCELLANEOUS) ×3 IMPLANT
COVER BACK TABLE 24X17X13 BIG (DRAPES) IMPLANT
COVER TABLE BACK 60X90 (DRAPES) ×2 IMPLANT
DERMABOND ADVANCED (GAUZE/BANDAGES/DRESSINGS) ×1
DERMABOND ADVANCED .7 DNX12 (GAUZE/BANDAGES/DRESSINGS) ×1 IMPLANT
DRAPE C-ARM 42X72 X-RAY (DRAPES) ×4 IMPLANT
DRAPE LAPAROTOMY 100X72X124 (DRAPES) ×2 IMPLANT
DRAPE POUCH INSTRU U-SHP 10X18 (DRAPES) ×2 IMPLANT
DRAPE PROXIMA HALF (DRAPES) ×2 IMPLANT
DRSG EMULSION OIL 3X3 NADH (GAUZE/BANDAGES/DRESSINGS) IMPLANT
ELECT REM PT RETURN 9FT ADLT (ELECTROSURGICAL) ×2
ELECTRODE REM PT RTRN 9FT ADLT (ELECTROSURGICAL) ×1 IMPLANT
GAUZE SPONGE 4X4 16PLY XRAY LF (GAUZE/BANDAGES/DRESSINGS) ×2 IMPLANT
GLOVE BIOGEL PI IND STRL 7.0 (GLOVE) ×1 IMPLANT
GLOVE BIOGEL PI IND STRL 8 (GLOVE) ×4 IMPLANT
GLOVE BIOGEL PI INDICATOR 7.0 (GLOVE) ×1
GLOVE BIOGEL PI INDICATOR 8 (GLOVE) ×4
GLOVE ECLIPSE 7.5 STRL STRAW (GLOVE) ×9 IMPLANT
GLOVE EXAM NITRILE LRG STRL (GLOVE) IMPLANT
GLOVE EXAM NITRILE MD LF STRL (GLOVE) ×2 IMPLANT
GLOVE EXAM NITRILE XL STR (GLOVE) IMPLANT
GLOVE EXAM NITRILE XS STR PU (GLOVE) IMPLANT
GLOVE SS BIOGEL STRL SZ 6.5 (GLOVE) IMPLANT
GLOVE SUPERSENSE BIOGEL SZ 6.5 (GLOVE) ×1
GOWN BRE IMP SLV AUR LG STRL (GOWN DISPOSABLE) ×3 IMPLANT
GOWN BRE IMP SLV AUR XL STRL (GOWN DISPOSABLE) ×3 IMPLANT
GOWN STRL REIN 2XL LVL4 (GOWN DISPOSABLE) ×2 IMPLANT
KIT BASIN OR (CUSTOM PROCEDURE TRAY) ×2 IMPLANT
KIT INFUSE SMALL (Orthopedic Implant) ×2 IMPLANT
KIT ROOM TURNOVER OR (KITS) ×2 IMPLANT
MILL MEDIUM DISP (BLADE) ×2 IMPLANT
NDL SPNL 18GX3.5 QUINCKE PK (NEEDLE) IMPLANT
NDL SPNL 22GX3.5 QUINCKE BK (NEEDLE) ×2 IMPLANT
NEEDLE BONE MARROW 8GAX6 (NEEDLE) ×1 IMPLANT
NEEDLE SPNL 18GX3.5 QUINCKE PK (NEEDLE) ×4 IMPLANT
NEEDLE SPNL 22GX3.5 QUINCKE BK (NEEDLE) ×4 IMPLANT
NS IRRIG 1000ML POUR BTL (IV SOLUTION) ×2 IMPLANT
PACK LAMINECTOMY NEURO (CUSTOM PROCEDURE TRAY) ×2 IMPLANT
PAD ARMBOARD 7.5X6 YLW CONV (MISCELLANEOUS) ×7 IMPLANT
PATTIES SURGICAL .5 X.5 (GAUZE/BANDAGES/DRESSINGS) IMPLANT
PATTIES SURGICAL .5 X1 (DISPOSABLE) IMPLANT
PATTIES SURGICAL 1X1 (DISPOSABLE) IMPLANT
PEEK PLIF AVS 13X25X4 (Peek) ×2 IMPLANT
ROD RADIUS 40MM (Neuro Prosthesis/Implant) ×4 IMPLANT
ROD SPNL 40X5.5XNS TI RDS (Neuro Prosthesis/Implant) IMPLANT
SCREW 5.75X50MM (Screw) ×2 IMPLANT
SCREW RADIUS 5.75X55MM (Screw) ×2 IMPLANT
SPONGE GAUZE 4X4 12PLY (GAUZE/BANDAGES/DRESSINGS) ×2 IMPLANT
SPONGE LAP 4X18 X RAY DECT (DISPOSABLE) IMPLANT
SPONGE NEURO XRAY DETECT 1X3 (DISPOSABLE) IMPLANT
SPONGE SURGIFOAM ABS GEL 100 (HEMOSTASIS) ×2 IMPLANT
STRIP BIOACTIVE VITOSS 25X52X4 (Orthopedic Implant) ×1 IMPLANT
STRIP VITOSS 25X100X4MM (Neuro Prosthesis/Implant) ×1 IMPLANT
SUT VIC AB 1 CT1 18XBRD ANBCTR (SUTURE) ×2 IMPLANT
SUT VIC AB 1 CT1 8-18 (SUTURE) ×4
SUT VIC AB 2-0 CP2 18 (SUTURE) ×5 IMPLANT
SUT VIC AB 3-0 SH 8-18 (SUTURE) ×1 IMPLANT
SYR 20ML ECCENTRIC (SYRINGE) ×2 IMPLANT
SYR CONTROL 10ML LL (SYRINGE) ×2 IMPLANT
TAPE CLOTH SURG 4X10 WHT LF (GAUZE/BANDAGES/DRESSINGS) ×1 IMPLANT
TOWEL OR 17X24 6PK STRL BLUE (TOWEL DISPOSABLE) ×2 IMPLANT
TOWEL OR 17X26 10 PK STRL BLUE (TOWEL DISPOSABLE) ×2 IMPLANT
TRAY FOLEY CATH 14FRSI W/METER (CATHETERS) ×2 IMPLANT
WATER STERILE IRR 1000ML POUR (IV SOLUTION) ×2 IMPLANT

## 2012-11-19 NOTE — Op Note (Signed)
11/19/2012  1:01 PM  PATIENT:  Oscar [name]  67 y.o. male  PRE-OPERATIVE DIAGNOSIS:  Lumbar three-four spondylolisthesis, stenosis, spondylosis, degenerative disc disease  POST-OPERATIVE DIAGNOSIS:  Lumbar three-four spondylolisthesis, stenosis, spondylosis, degenerative disc disease  PROCEDURE:  Procedure(s): POSTERIOR LUMBAR FUSION 2 LEVEL:  Bilateral L3-4 lumbar decompression, including bilateral laminectomy, facetectomy, foraminotomy, with decompression of severely stenotic neural foramina, right worse than left, with decompression of the exiting L3 and L4 nerve roots bilaterally, with decompression beyond that required for interbody arthrodesis, bilateral L3-4 posterior lumbar interbody arthrodesis with AVS peek interbody implants and Vitoss BA with bone marrow aspirate and infuse, and bilateral L3-4 posterior arthrodesis with radius posterior instrumentation, Vitoss BA with bone marrow aspirate, and infuse  SURGEON:  Surgeon(s): Hewitt Shorts, MD Clydene Fake, MD  ASSISTANTS: Clydene Fake, M.D.  ANESTHESIA:   general  EBL:  Total I/O In: 2300 [I.V.:2200; Blood:100] Out: 560 [Urine:210; Blood:350]  BLOOD ADMINISTERED:100 CC CELLSAVER  COUNT: correct per nursing staff  DICTATION: Patient is brought to the operating room placed under general endotracheal anesthesia. The patient was turned to prone position the lumbar region was prepped with Betadine soap and solution and draped in a sterile fashion. The midline was infiltrated with local anesthesia with epinephrine. A localizing x-ray was taken and then a midline incision was made carried down through the subcutaneous tissue, bipolar cautery and electrocautery were used to maintain hemostasis. Dissection was carried down to the lumbar fascia. The fascia was incised bilaterally and the paraspinal muscles were dissected with a spinous process and lamina in a subperiosteal fashion. Another x-ray was taken for localization and  the L3-4 level was localized. Dissection was then carried out laterally over the facet complex and the transverse processes of L3 and L4 were exposed and decorticated. Decompression via bilateral L3-4 laminectomy, facetectomy, and foraminotomy was performed using the high-speed drill and Kerrison punches. Decompression was carried out laterally including facetectomy and foraminotomies with decompression of the stenotic compression of the L3 and L4 nerve roots. Once the decompression stenotic compression of the thecal sac and exiting nerve roots was completed we proceeded with the posterior lumbar interbody arthrodesis. The annulus was incised bilaterally and the disc space entered. A thorough discectomy was performed using pituitary rongeurs and curettes. Once the discectomy was completed we began to prepare the endplate surfaces removing the cartilaginous endplates surface with paddle curettes. We then measured the height of the intervertebral disc space. We selected 13 x 25 x 4 by 11 AVS peek interbody implants.  The C-arm fluoroscope was then draped and brought in the field and we identified the pedicle entry points bilaterally at the L3 and L4 levels. Each of the 4 pedicles was probed, we aspirated bone marrow aspirate from the vertebral bodies, this was injected over a 10 cc strip and a 5 cc strip of Vitoss BA. Then each of the pedicles was examined with the ball probe good bony surfaces were found and no bony cuts were found. Each of the pedicles was then tapped with a 5.25 mm tap, again examined with the ball probe good threading was found and no bony cuts were found. We then placed 5.75 by 50 millimeter screws bilaterally at the L3 level, and placed 5.75 x 55 mm screws bilaterally at the L4 level.  We then packed the AVS peek interbody implants with Vitoss BA with bone marrow aspirate, and then placed the first implant and on the right side, carefully retracting the thecal sac and nerve  root medially.  We then went back to the left side and packed the midline with additional Vitoss with bone marrow aspirate and infuse, and then placed a second implant and on the left side again retracting the thecal sac and nerve root medially. Additional Vitoss BA with bone marrow aspirate and infuse was packed lateral to the implants.  We then packed the lateral gutter over the transverse processes and intertransverse space with Vitoss BA with bone marrow aspirate and infuse. We then selected  40 mm pre-lordosed rods, they were placed within the screw heads and secured with locking caps once all 4 locking caps were placed final tightening was performed against a counter torque.  The wound had been irrigated multiple times during the procedure with saline solution and bacitracin solution, good hemostasis was established with a combination of bipolar cautery and Gelfoam with thrombin. Once good hemostasis was confirmed we proceeded with closure paraspinal muscles deep fascia and Scarpa's fascia were closed with interrupted undyed 1 Vicryl sutures the subcutaneous and subcuticular closed with interrupted inverted 2-0 undyed Vicryl sutures the skin edges were approximated with Dermabond. A dressing of sterile gauze and Hypafix was applied.  Following surgery the patient was turned back to the supine position to be reversed and the anesthetic extubated and transferred to the recovery room for further care.   PLAN OF CARE: Admit for overnight observation  PATIENT DISPOSITION:  PACU - hemodynamically stable.   Delay start of Pharmacological VTE agent (>24hrs) due to surgical blood loss or risk of bleeding:  yes

## 2012-11-19 NOTE — Preoperative (Signed)
Beta Blockers   Reason not to administer Beta Blockers:Not Applicable 

## 2012-11-19 NOTE — H&P (Signed)
Subjective: Patient is a 67 y.o. male who is admitted for treatment of lumbar stenosis, lumbar spondylosis, and bilateral lumbar radiculopathy, right worse than left. Patient having worsening difficulties for the past 17 months. He has complaints of pain in his low back sitting down into the right buttock, posterior thigh, at times the anterior right thigh. His has a weakness in the proximal right thigh, but also times symptoms weakness and proximal left thigh. He denies numbness or paresthesias. He has used Lodine, but has had this difficulty with swelling in the feet ankles to the Lodine.  Patient was worked up with the x-rays and MRI scan he is a grade 1 dynamic degenerative spinal listhesis L3-L4 with advanced hypertrophic facet arthropathy at the L3-4 level and resulting marked canal stenosis as well as marked neural foraminal stenosis, right worse than left, with corresponding to a radiculopathy. He also has more mild degenerative changes seen at the L2-3, L4-5, L5-S1 levels.  Patient is admitted now for an L3-4 lumbar decompression and laminectomy, facetectomy, and foraminotomies, as well as a bilateral L3-4 posterior lumbar interbody arthrodesis with interbody implants and bone graft, and a bilateral L3-4 posterior arthrodesis with postreduction patient and bone graft.   Past Medical History  Diagnosis Date  . Diabetes mellitus without complication   . Hyperlipidemia   . Hypertension   . Chronic right hip pain     and right leg pain  . Swelling of extremity     bil feet  . Gout     bil ankles  . Shortness of breath   . Dysrhythmia     pt. reports that he has been told in the past that he has  irreg. heattbeat   . Arthritis     lumbar, spondylosis, DDD,stenosis     Past Surgical History  Procedure Date  . Elbow bursa surgery     removed bursacs both elbows  . Cardiac catheterization     2004, outcome good, to be followed for/with medical therapy     Prescriptions prior to  admission  Medication Sig Dispense Refill  . allopurinol (ZYLOPRIM) 300 MG tablet Take 300 mg by mouth daily before breakfast.       . aspirin 81 MG tablet Take 81 mg by mouth daily.      . colchicine 0.6 MG tablet Take 0.6 mg by mouth daily with breakfast.       . dexamethasone (DECADRON) 4 MG tablet Take 4 mg by mouth as needed. For pain per patient      . etodolac (LODINE) 400 MG tablet Take 400 mg by mouth 2 (two) times daily.      Marland Kitchen losartan (COZAAR) 50 MG tablet Take 50 mg by mouth daily before breakfast.       . metFORMIN (GLUCOPHAGE) 500 MG tablet Take 500 mg by mouth 2 (two) times daily with a meal.      . pravastatin (PRAVACHOL) 40 MG tablet Take 40 mg by mouth daily with breakfast.       . valACYclovir (VALTREX) 500 MG tablet Take 500 mg by mouth daily with supper.        Allergies  Allergen Reactions  . Yellow Fever Vaccine Hives    History  Substance Use Topics  . Smoking status: Former Smoker -- 0.3 packs/day for 10 years    Types: Cigarettes    Quit date: 11/04/1973  . Smokeless tobacco: Never Used  . Alcohol Use: Yes     Comment: occas. wine or beer  Family History  Problem Relation Age of Onset  . Heart disease Father   . Diabetes Father   . Heart disease Brother   . Diabetes Paternal Grandmother      Review of Systems A comprehensive review of systems was negative.  Objective: Vital signs in last 24 hours: Temp:  [98.8 F (37.1 C)-99 F (37.2 C)] 98.8 F (37.1 C) (01/16 0622) Pulse Rate:  [81-89] 81  (01/16 0622) Resp:  [18-20] 18  (01/16 0622) BP: (124-142)/(87-88) 124/87 mmHg (01/16 0622) SpO2:  [95 %-99 %] 95 % (01/16 0622) Weight:  [136.85 kg (301 lb 11.2 oz)] 136.85 kg (301 lb 11.2 oz) (01/15 1050)  EXAM:  Patient is a well-nourished black male in no acute distress.  Lungs are clear to auscultation , the patient has symmetrical respiratory excursion. Heart has a regular rate and rhythm normal S1 and S2 no murmur.   Abdomen is soft nontender  nondistended bowel sounds are present. Extremity examination shows no clubbing cyanosis or edema. Musculoskeletal examination shows noticed a patient of the lumbar spinous processes or paralumbar musculature. He is in the flexor degrees, and able to extend to 10. Straight leg raising is negative.  Motor examination shows 5 over 5 strength in the lower extremities including the iliopsoas quadriceps dorsiflexor extensor hallicus  longus and plantar flexor bilaterally. Sensation is intact to pinprick in the distal lower extremities. Reflexes are symmetrical bilaterally. No pathologic reflexes are present. Patient has a normal gait and stance.   Data Review:CBC    Component Value Date/Time   WBC 4.8 11/18/2012 1117   RBC 4.81 11/18/2012 1117   HGB 15.5 11/18/2012 1117   HCT 44.7 11/18/2012 1117   PLT 178 11/18/2012 1117   MCV 92.9 11/18/2012 1117   MCH 32.2 11/18/2012 1117   MCHC 34.7 11/18/2012 1117   RDW 12.1 11/18/2012 1117                          BMET    Component Value Date/Time   NA 141 11/18/2012 1117   K 4.0 11/18/2012 1117   CL 103 11/18/2012 1117   CO2 26 11/18/2012 1117   GLUCOSE 171* 11/18/2012 1117   BUN 8 11/18/2012 1117   CREATININE 0.88 11/18/2012 1117   CALCIUM 9.5 11/18/2012 1117   GFRNONAA 87* 11/18/2012 1117   GFRAA >90 11/18/2012 1117     Assessment/Plan: Patient with a advanced generation at the L3-4 level, with a dynamic degenerative spinal listhesis, and marked spinal stenosis. He is admitted for an L3-4 lumbar decompression and arthrodesis.  I've discussed with the patient the nature of his condition, the nature the surgical procedure, the typical length of surgery, hospital stay, and overall recuperation, the limitations postoperatively, and risks of surgery. I discussed risks including risks of infection, bleeding, possibly need for transfusion, the risk of nerve root dysfunction with pain, weakness, numbness, or paresthesias, the risk of dural tear and CSF leakage and  possible need for further surgery, the risk of failure of the arthrodesis and possibly for further surgery, the risk of anesthetic complications including myocardial infarction, stroke, pneumonia, and death. We discussed the need for postoperative immobilization in a lumbar brace. Understanding all this the patient does wish to proceed with surgery and is admitted for such.     Hewitt Shorts, MD 11/19/2012 7:17 AM

## 2012-11-19 NOTE — Plan of Care (Signed)
Problem: Consults Goal: Diagnosis - Spinal Surgery Outcome: Completed/Met Date Met:  11/19/12 Thoraco/Lumbar Spine Fusion

## 2012-11-19 NOTE — Anesthesia Postprocedure Evaluation (Signed)
Anesthesia Post Note  Patient: Oscar [name]  Procedure(s) Performed: Procedure(s) (LRB): POSTERIOR LUMBAR FUSION 1 LEVEL (Bilateral)  Anesthesia type: general  Patient location: PACU  Post pain: Pain level controlled  Post assessment: Patient's Cardiovascular Status Stable  Last Vitals:  Filed Vitals:   11/19/12 1356  BP: 127/91  Pulse: 87  Temp:   Resp: 22    Post vital signs: Reviewed and stable  Level of consciousness: sedated  Complications: No apparent anesthesia complications

## 2012-11-19 NOTE — Anesthesia Preprocedure Evaluation (Signed)
Anesthesia Evaluation  Patient identified by MRN, date of birth, ID band Patient awake    Reviewed: Allergy & Precautions, H&P , NPO status , Patient's Chart, lab work & pertinent test results, reviewed documented beta blocker date and time   Airway Mallampati: III TM Distance: >3 FB Neck ROM: full    Dental   Pulmonary shortness of breath and with exertion,  breath sounds clear to auscultation        Cardiovascular hypertension, On Medications + CAD + dysrhythmias Rhythm:regular     Neuro/Psych negative neurological ROS  negative psych ROS   GI/Hepatic negative GI ROS, Neg liver ROS,   Endo/Other  diabetes, Oral Hypoglycemic AgentsMorbid obesity  Renal/GU negative Renal ROS  negative genitourinary   Musculoskeletal   Abdominal   Peds  Hematology negative hematology ROS (+)   Anesthesia Other Findings See surgeon's H&P   Reproductive/Obstetrics negative OB ROS                           Anesthesia Physical Anesthesia Plan  ASA: III  Anesthesia Plan: General   Post-op Pain Management:    Induction: Intravenous  Airway Management Planned: Oral ETT  Additional Equipment: Arterial line  Intra-op Plan:   Post-operative Plan: Extubation in OR  Informed Consent: I have reviewed the patients History and Physical, chart, labs and discussed the procedure including the risks, benefits and alternatives for the proposed anesthesia with the patient or authorized representative who has indicated his/her understanding and acceptance.   Dental Advisory Given  Plan Discussed with: CRNA and Surgeon  Anesthesia Plan Comments:         Anesthesia Quick Evaluation

## 2012-11-19 NOTE — Anesthesia Procedure Notes (Signed)
Procedure Name: Intubation Date/Time: 11/19/2012 8:30 AM Performed by: Margaree Mackintosh Pre-anesthesia Checklist: Patient identified, Timeout performed, Emergency Drugs available, Suction available and Patient being monitored Patient Re-evaluated:Patient Re-evaluated prior to inductionOxygen Delivery Method: Circle system utilized Preoxygenation: Pre-oxygenation with 100% oxygen Intubation Type: IV induction Ventilation: Mask ventilation without difficulty and Oral airway inserted - appropriate to patient size Laryngoscope Size: Mac and 4 Grade View: Grade I Tube type: Oral Tube size: 7.5 mm Number of attempts: 1 Airway Equipment and Method: Stylet Placement Confirmation: ETT inserted through vocal cords under direct vision,  positive ETCO2 and breath sounds checked- equal and bilateral Secured at: 22 cm Tube secured with: Tape Dental Injury: Teeth and Oropharynx as per pre-operative assessment

## 2012-11-19 NOTE — Transfer of Care (Signed)
Immediate Anesthesia Transfer of Care Note  Patient: Oscar [name]  Procedure(s) Performed: Procedure(s) (LRB) with comments: POSTERIOR LUMBAR FUSION 1 LEVEL (Bilateral) - Lumbar three-four  decompression with posterior lumbar interbody prothesis, posterolateral arthrodesis and posterior nonsegmental instrumentation  Patient Location: PACU  Anesthesia Type:General  Level of Consciousness: awake, alert  Airway & Oxygen Therapy: Patient Spontanous Breathing and Patient connected to nasal cannula oxygen  Post-op Assessment: Report given to PACU RN, Post -op Vital signs reviewed and stable and Patient moving all extremities  Post vital signs: Reviewed and stable  Complications: No apparent anesthesia complications

## 2012-11-20 LAB — GLUCOSE, CAPILLARY
Glucose-Capillary: 191 mg/dL — ABNORMAL HIGH (ref 70–99)
Glucose-Capillary: 159 mg/dL — ABNORMAL HIGH (ref 70–99)
Glucose-Capillary: 180 mg/dL — ABNORMAL HIGH (ref 70–99)

## 2012-11-20 MED ORDER — OXYCODONE-ACETAMINOPHEN 5-325 MG PO TABS: 1.0000 | ORAL_TABLET | ORAL | Status: DC | PRN

## 2012-11-20 MED FILL — Sodium Chloride IV Soln 0.9%: INTRAVENOUS | Qty: 1000 | Status: AC

## 2012-11-20 MED FILL — Heparin Sodium (Porcine) Inj 1000 Unit/ML: INTRAMUSCULAR | Qty: 30 | Status: AC

## 2012-11-20 MED FILL — Sodium Chloride Irrigation Soln 0.9%: Qty: 3000 | Status: AC

## 2012-11-20 NOTE — Progress Notes (Signed)
Utilization review completed. Crystian Frith, RN, BSN. 

## 2012-11-20 NOTE — Progress Notes (Signed)
Filed Vitals:   11/19/12 1945 11/20/12 0000 11/20/12 0400 11/20/12 0820  BP: 126/80 115/72 119/79 113/72  Pulse: 95 93 85   Temp: 99.2 F (37.3 C) 99.1 F (37.3 C) 98.5 F (36.9 C) 99 F (37.2 C)  TempSrc: Oral Oral Oral   Resp: 18 16 18 20   SpO2: 94% 93% 94% 93%    CBC  Basename 11/18/12 1117  WBC 4.8  HGB 15.5  HCT 44.7  PLT 178   BMET  Basename 11/18/12 1117  NA 141  K 4.0  CL 103  CO2 26  GLUCOSE 171*  BUN 8  CREATININE 0.88  CALCIUM 9.5     Patient resting comfortably in bed. Has been up and ambulating. Dressing clean and dry. Moving all 4 extremities well. Good relief of radicular pain.  Plan: Encouraged to ambulate. Continue progress to postoperative recovery.  Hewitt Shorts, MD 11/20/2012, 8:23 AM

## 2012-11-20 NOTE — Discharge Summary (Signed)
Physician Discharge Summary  Patient ID: Oscar [name] MRN: [id number] DOB/AGE: June 11, 1946 66 y.o.  Admit date: 11/19/2012 Discharge date: 11/20/2012  Admission Diagnoses:  Lumbar spondylolisthesis, lumbar stenosis, lumbar spondylosis, lumbar degenerative disc disease  Discharge Diagnoses:  Lumbar spondylolisthesis, lumbar stenosis, lumbar spondylosis, lumbar degenerative disc disease   Discharged Condition: good  Hospital Course:  Patient was admitted, underwent an L3-4 lumbar decompression, PLIF, and PLA. Postoperatively he has done well. He is up and ambulating actively in the halls. His Foley was discontinued this morning, and he is voiding well, with normal PVRs. His stressing was removed, and his wound is healing well. He is being discharged home with instructions regarding wound care and activities. He is to followup with me in the office in 3 weeks.  Discharge Exam: Blood pressure 121/72, pulse 86, temperature 99.1 F (37.3 C), temperature source Oral, resp. rate 20, SpO2 94.00%.  Disposition: Home     Medication List     As of 11/20/2012  6:39 PM    TAKE these medications         allopurinol 300 MG tablet   Commonly known as: ZYLOPRIM   Take 300 mg by mouth daily before breakfast.      aspirin 81 MG tablet   Take 81 mg by mouth daily.      colchicine 0.6 MG tablet   Take 0.6 mg by mouth daily with breakfast.      dexamethasone 4 MG tablet   Commonly known as: DECADRON   Take 4 mg by mouth as needed. For pain per patient      etodolac 400 MG tablet   Commonly known as: LODINE   Take 400 mg by mouth 2 (two) times daily.      losartan 50 MG tablet   Commonly known as: COZAAR   Take 50 mg by mouth daily before breakfast.      metFORMIN 500 MG tablet   Commonly known as: GLUCOPHAGE   Take 500 mg by mouth 2 (two) times daily with a meal.      oxyCODONE-acetaminophen 5-325 MG per tablet   Commonly known as: PERCOCET/ROXICET   Take 1-2 tablets by mouth every 4  (four) hours as needed.      pravastatin 40 MG tablet   Commonly known as: PRAVACHOL   Take 40 mg by mouth daily with breakfast.      valACYclovir 500 MG tablet   Commonly known as: VALTREX   Take 500 mg by mouth daily with supper.         Signed: Hewitt Shorts, MD 11/20/2012, 6:39 PM

## 2012-11-20 NOTE — Progress Notes (Signed)
Inpatient Diabetes Program Recommendations  AACE/ADA: New Consensus Statement on Inpatient Glycemic Control (2013)  Target Ranges:  Prepandial:   less than 140 mg/dL      Peak postprandial:   less than 180 mg/dL (1-2 hours)      Critically ill patients:  140 - 180 mg/dL   Results for Oscar [name], Oscar [name] (MRN [id number]) as of 11/20/2012 11:45  Ref. Range 11/19/2012 06:26 11/19/2012 12:57 11/19/2012 17:03 11/19/2012 21:34 11/20/2012 07:52  Glucose-Capillary Latest Range: 70-99 mg/dL 782 (H) 956 (H) 213 (H) 224 (H) 191 (H)    Inpatient Diabetes Program Recommendations Correction (SSI): Please consider ordering CBGs ACHS with Novolog correction if necessary while patient is an inpatient. HgbA1C: Please consider ordering and A1C to determine glycemic control over the past 2-3 months.  Note: Patient has a history of diabetes and takes Metformin 500 mg BID at home for diabetes management.  Currently patient is ordered to receive Metformin 500 mg BID for inpatient glycemic control.  Fasting blood glucose this morning was 191 mg/dl and blood glucose has ranged from 140-224 mg/dl over the past 24 hours.  Please consider checking blood glucose ACH with Novolog correction if necessary while inpatient and ordering an A1C to determine glycemic control over the past 2-3 months.  Will continue to follow.  Thanks, Orlando Penner, RN, BSN, CCRN Diabetes Coordinator Inpatient Diabetes Program [phone]

## 2012-11-24 ENCOUNTER — Ambulatory Visit (HOSPITAL_COMMUNITY)
Admission: RE | Admit: 2012-11-24 | Discharge: 2012-11-24 | Disposition: A | Discharge status: Home / Self Care | Payer: BC Managed Care – PPO | Source: Ambulatory Visit | Attending: Neurosurgery | Admitting: Neurosurgery

## 2012-11-24 ENCOUNTER — Encounter: Payer: BC Managed Care – PPO | Admitting: Gastroenterology

## 2012-11-24 ENCOUNTER — Other Ambulatory Visit (HOSPITAL_COMMUNITY): Payer: Self-pay | Admitting: Neurosurgery

## 2012-11-24 DIAGNOSIS — R609 Edema, unspecified: Secondary | ICD-10-CM

## 2012-11-24 DIAGNOSIS — M79609 Pain in unspecified limb: Secondary | ICD-10-CM

## 2012-11-24 DIAGNOSIS — M7989 Other specified soft tissue disorders: Secondary | ICD-10-CM

## 2012-11-24 NOTE — Progress Notes (Signed)
VASCULAR LAB PRELIMINARY  PRELIMINARY  PRELIMINARY  PRELIMINARY  Bilateral lower extremity venous duplex completed.    Preliminary report:  Bilateral:  No evidence of DVT, superficial thrombosis, or Baker's Cyst.   Emilyann Banka, RVS 11/24/2012, 3:30 PM

## 2013-02-18 ENCOUNTER — Encounter: Payer: Self-pay | Admitting: Gastroenterology

## 2013-03-15 ENCOUNTER — Encounter: Payer: Self-pay | Admitting: Gastroenterology

## 2013-04-29 ENCOUNTER — Encounter: Payer: BC Managed Care – PPO | Admitting: Gastroenterology

## 2013-05-11 ENCOUNTER — Encounter: Payer: Self-pay | Admitting: Gastroenterology

## 2013-05-11 ENCOUNTER — Ambulatory Visit (AMBULATORY_SURGERY_CENTER): Payer: BC Managed Care – PPO | Admitting: *Deleted

## 2013-05-11 VITALS — Ht 72.0 in | Wt 290.0 lb

## 2013-05-11 DIAGNOSIS — Z860100 Personal history of colon polyps, unspecified: Secondary | ICD-10-CM

## 2013-05-11 NOTE — Progress Notes (Signed)
No egg or soy allergy  Pt had back surgery with Dr. Newell Coral in January 2014. Per pt, he has received clearance from surgeon to have colonoscopy anytime after 05-18-13.  His colonoscopy is 05-25-13

## 2013-05-12 ENCOUNTER — Encounter: Payer: Self-pay | Admitting: Gastroenterology

## 2013-05-25 ENCOUNTER — Encounter: Payer: Self-pay | Admitting: Gastroenterology

## 2013-05-25 ENCOUNTER — Ambulatory Visit (AMBULATORY_SURGERY_CENTER): Payer: BC Managed Care – PPO | Admitting: Gastroenterology

## 2013-05-25 VITALS — BP 135/92 | HR 74 | Temp 96.7°F | Resp 19 | Ht 72.0 in | Wt 290.0 lb

## 2013-05-25 DIAGNOSIS — D126 Benign neoplasm of colon, unspecified: Secondary | ICD-10-CM

## 2013-05-25 DIAGNOSIS — Z86010 Personal history of colonic polyps: Secondary | ICD-10-CM

## 2013-05-25 DIAGNOSIS — K5730 Diverticulosis of large intestine without perforation or abscess without bleeding: Secondary | ICD-10-CM

## 2013-05-25 MED ORDER — SODIUM CHLORIDE 0.9 % IV SOLN: 500.0000 mL | INTRAVENOUS | Status: DC

## 2013-05-25 NOTE — Progress Notes (Signed)
Called to room to assist during endoscopic procedure.  Patient ID and intended procedure confirmed with present staff. Received instructions for my participation in the procedure from the performing physician.  

## 2013-05-25 NOTE — Progress Notes (Signed)
Patient did not experience any of the following events: a burn prior to discharge; a fall within the facility; wrong site/side/patient/procedure/implant event; or a hospital transfer or hospital admission upon discharge from the facility. (G8907) Patient did not have preoperative order for IV antibiotic SSI prophylaxis. (G8918)  

## 2013-05-25 NOTE — Progress Notes (Signed)
Patient to restroom prior to leaving. 

## 2013-05-25 NOTE — Patient Instructions (Addendum)
YOU HAD AN ENDOSCOPIC PROCEDURE TODAY AT THE [facility]: Refer to the procedure report that was given to you for any specific questions about what was found during the examination.  If the procedure report does not answer your questions, please call your gastroenterologist to clarify.  If you requested that your care partner not be given the details of your procedure findings, then the procedure report has been included in a sealed envelope for you to review at your convenience later.  YOU SHOULD EXPECT: Some feelings of bloating in the abdomen. Passage of more gas than usual.  Walking can help get rid of the air that was put into your GI tract during the procedure and reduce the bloating. If you had a lower endoscopy (such as a colonoscopy or flexible sigmoidoscopy) you may notice spotting of blood in your stool or on the toilet paper. If you underwent a bowel prep for your procedure, then you may not have a normal bowel movement for a few days.  DIET: Your first meal following the procedure should be a light meal and then it is ok to progress to your normal diet.  A half-sandwich or bowl of soup is an example of a good first meal.  Heavy or fried foods are harder to digest and may make you feel nauseous or bloated.  Likewise meals heavy in dairy and vegetables can cause extra gas to form and this can also increase the bloating.  Drink plenty of fluids but you should avoid alcoholic beverages for 24 hours.  ACTIVITY: Your care partner should take you home directly after the procedure.  You should plan to take it easy, moving slowly for the rest of the day.  You can resume normal activity the day after the procedure however you should NOT DRIVE or use heavy machinery for 24 hours (because of the sedation medicines used during the test).    SYMPTOMS TO REPORT IMMEDIATELY: A gastroenterologist can be reached at any hour.  During normal business hours, 8:30 AM to 5:00 PM Monday through Friday,  call [phone].  After hours and on weekends, please call the GI answering service at [phone] who will take a message and have the physician on call contact you.   Following lower endoscopy (colonoscopy or flexible sigmoidoscopy):  Excessive amounts of blood in the stool  Significant tenderness or worsening of abdominal pains  Swelling of the abdomen that is new, acute  Fever of 100F or higher    FOLLOW UP: If any biopsies were taken you will be contacted by phone or by letter within the next 1-3 weeks.  Call your gastroenterologist if you have not heard about the biopsies in 3 weeks.  Our staff will call the home number listed on your records the next business day following your procedure to check on you and address any questions or concerns that you may have at that time regarding the information given to you following your procedure. This is a courtesy call and so if there is no answer at the home number and we have not heard from you through the emergency physician on call, we will assume that you have returned to your regular daily activities without incident.  SIGNATURES/CONFIDENTIALITY: You and/or your care partner have signed paperwork which will be entered into your electronic medical record.  These signatures attest to the fact that that the information above on your After Visit Summary has been reviewed and is understood.  Full responsibility of the confidentiality   of this discharge information lies with you and/or your care-partner.     

## 2013-05-25 NOTE — Op Note (Signed)
[facility] [address] [place], [address]   COLONOSCOPY PROCEDURE REPORT  PATIENT: [name], Oscar  MR#: [id number] BIRTHDATE: [birth year] , 67  yrs. old GENDER: Male ENDOSCOPIST: Louis Meckel, MD REFERRED AO:ZHYQM Chilton Si, M.D. PROCEDURE DATE:  05/25/2013 PROCEDURE:   Colonoscopy with snare polypectomy , adenomatous polyps 2008 ASA CLASS:   Class II INDICATIONS:Patient's personal history of colon polyps. MEDICATIONS: MAC sedation, administered by CRNA and propofol (Diprivan) 350mg  IV  DESCRIPTION OF PROCEDURE:   After the risks benefits and alternatives of the procedure were thoroughly explained, informed consent was obtained.  A digital rectal exam revealed no abnormalities of the rectum.   The LB [id number]  endoscope was introduced through the anus and advanced to the cecum, which was identified by both the appendix and ileocecal valve. No adverse events experienced.   The quality of the prep was Suprep good  The instrument was then slowly withdrawn as the colon was fully examined.      COLON FINDINGS: A sessile polyp measuring 5 mm in size was found at the hepatic flexure.  A polypectomy was performed with a cold snare.  The resection was complete and the polyp tissue was completely retrieved.   A sessile polyp measuring 9 mm in size was found in the proximal transverse colon.  A polypectomy was performed using snare cautery.  The resection was complete and the polyp tissue was completely retrieved.   Moderate diverticulosis was noted in the sigmoid colon and descending colon.   The colon mucosa was otherwise normal.  Retroflexed views revealed no abnormalities. The time to cecum=9 minutes 53 seconds.  Withdrawal time=8 minutes 33 seconds.  The scope was withdrawn and the procedure completed. COMPLICATIONS: There were no complications.  ENDOSCOPIC IMPRESSION: 1.   Sessile polyp measuring 5 mm in size was found at the hepatic flexure;  polypectomy was performed with a cold snare 2.   Sessile polyp measuring 9 mm in size was found in the proximal transverse colon; polypectomy was performed using snare cautery 3.   Moderate diverticulosis was noted in the sigmoid colon and descending colon 4.   The colon mucosa was otherwise normal  RECOMMENDATIONS: If the polyp(s) removed today are proven to be adenomatous (pre-cancerous) polyps, you will need a repeat colonoscopy in 5 years.  Otherwise you should continue to follow colorectal cancer screening guidelines for "routine risk" patients with colonoscopy in 10 years.  You will receive a letter within 1-2 weeks with the results of your biopsy as well as final recommendations.  Please call my office if you have not received a letter after 3 weeks.   eSigned:  Louis Meckel, MD 05/25/2013 8:45 AM   cc:   PATIENT NAME:  Oscar [name], Oscar [name] MR#: [id number]

## 2013-05-26 ENCOUNTER — Telehealth: Payer: Self-pay

## 2013-05-26 NOTE — Telephone Encounter (Signed)
  Follow up Call-  Call back number 05/25/2013  Post procedure Call Back phone  # [phone]  Permission to leave phone message Yes     Patient questions:  Do you have a fever, pain , or abdominal swelling? no Pain Score  0 *  Have you tolerated food without any problems? yes  Have you been able to return to your normal activities? yes  Do you have any questions about your discharge instructions: Diet   no Medications  no Follow up visit  no  Do you have questions or concerns about your Care? no  Actions: * If pain score is 4 or above: No action needed, pain <4.

## 2013-06-01 ENCOUNTER — Encounter: Payer: Self-pay | Admitting: Gastroenterology

## 2013-07-02 ENCOUNTER — Other Ambulatory Visit: Payer: Self-pay | Admitting: Neurosurgery

## 2013-07-02 DIAGNOSIS — M5416 Radiculopathy, lumbar region: Secondary | ICD-10-CM

## 2013-07-04 ENCOUNTER — Other Ambulatory Visit: Payer: BC Managed Care – PPO

## 2013-07-07 ENCOUNTER — Ambulatory Visit
Admission: RE | Admit: 2013-07-07 | Discharge: 2013-07-07 | Disposition: A | Discharge status: Home / Self Care | Payer: BC Managed Care – PPO | Source: Ambulatory Visit | Attending: Neurosurgery | Admitting: Neurosurgery

## 2013-07-07 DIAGNOSIS — M5416 Radiculopathy, lumbar region: Secondary | ICD-10-CM

## 2013-07-07 MED ORDER — GADOBENATE DIMEGLUMINE 529 MG/ML IV SOLN
20.0000 mL | Freq: Once | INTRAVENOUS | Status: AC | PRN
  Administered 2013-07-07: 20 mL via INTRAVENOUS

## 2013-07-13 ENCOUNTER — Other Ambulatory Visit: Payer: BC Managed Care – PPO

## 2015-05-16 ENCOUNTER — Ambulatory Visit
Admission: RE | Admit: 2015-05-16 | Discharge: 2015-05-16 | Disposition: A | Discharge status: Home / Self Care | Payer: PRIVATE HEALTH INSURANCE | Source: Ambulatory Visit | Attending: Internal Medicine | Admitting: Internal Medicine

## 2015-05-16 ENCOUNTER — Other Ambulatory Visit: Payer: Self-pay | Admitting: Internal Medicine

## 2015-05-16 DIAGNOSIS — M25511 Pain in right shoulder: Secondary | ICD-10-CM

## 2015-07-13 ENCOUNTER — Other Ambulatory Visit: Payer: Self-pay | Admitting: Family Medicine

## 2015-07-13 DIAGNOSIS — M25511 Pain in right shoulder: Secondary | ICD-10-CM

## 2015-07-25 ENCOUNTER — Other Ambulatory Visit: Payer: PRIVATE HEALTH INSURANCE

## 2015-07-31 ENCOUNTER — Ambulatory Visit
Admission: RE | Admit: 2015-07-31 | Discharge: 2015-07-31 | Disposition: A | Discharge status: Home / Self Care | Payer: PRIVATE HEALTH INSURANCE | Source: Ambulatory Visit | Attending: Family Medicine | Admitting: Family Medicine

## 2015-07-31 DIAGNOSIS — M25511 Pain in right shoulder: Secondary | ICD-10-CM

## 2015-08-15 ENCOUNTER — Other Ambulatory Visit: Payer: Self-pay

## 2015-08-15 ENCOUNTER — Encounter (HOSPITAL_BASED_OUTPATIENT_CLINIC_OR_DEPARTMENT_OTHER)
Admission: RE | Admit: 2015-08-15 | Discharge: 2015-08-15 | Disposition: A | Discharge status: Home / Self Care | Payer: PRIVATE HEALTH INSURANCE | Source: Ambulatory Visit | Attending: Orthopedic Surgery | Admitting: Orthopedic Surgery

## 2015-08-15 ENCOUNTER — Encounter (HOSPITAL_BASED_OUTPATIENT_CLINIC_OR_DEPARTMENT_OTHER): Payer: Self-pay | Admitting: *Deleted

## 2015-08-15 DIAGNOSIS — M109 Gout, unspecified: Secondary | ICD-10-CM | POA: Diagnosis not present

## 2015-08-15 DIAGNOSIS — M25551 Pain in right hip: Secondary | ICD-10-CM | POA: Diagnosis not present

## 2015-08-15 DIAGNOSIS — M47816 Spondylosis without myelopathy or radiculopathy, lumbar region: Secondary | ICD-10-CM | POA: Diagnosis not present

## 2015-08-15 DIAGNOSIS — E119 Type 2 diabetes mellitus without complications: Secondary | ICD-10-CM | POA: Diagnosis not present

## 2015-08-15 DIAGNOSIS — G8929 Other chronic pain: Secondary | ICD-10-CM | POA: Diagnosis not present

## 2015-08-15 DIAGNOSIS — M25511 Pain in right shoulder: Secondary | ICD-10-CM | POA: Diagnosis present

## 2015-08-15 DIAGNOSIS — Z7984 Long term (current) use of oral hypoglycemic drugs: Secondary | ICD-10-CM | POA: Diagnosis not present

## 2015-08-15 DIAGNOSIS — M4806 Spinal stenosis, lumbar region: Secondary | ICD-10-CM | POA: Diagnosis not present

## 2015-08-15 DIAGNOSIS — Z7982 Long term (current) use of aspirin: Secondary | ICD-10-CM | POA: Diagnosis not present

## 2015-08-15 DIAGNOSIS — Z6838 Body mass index (BMI) 38.0-38.9, adult: Secondary | ICD-10-CM | POA: Diagnosis not present

## 2015-08-15 DIAGNOSIS — M5136 Other intervertebral disc degeneration, lumbar region: Secondary | ICD-10-CM | POA: Diagnosis not present

## 2015-08-15 DIAGNOSIS — E785 Hyperlipidemia, unspecified: Secondary | ICD-10-CM | POA: Diagnosis not present

## 2015-08-15 DIAGNOSIS — I10 Essential (primary) hypertension: Secondary | ICD-10-CM | POA: Diagnosis not present

## 2015-08-15 DIAGNOSIS — Z887 Allergy status to serum and vaccine status: Secondary | ICD-10-CM | POA: Diagnosis not present

## 2015-08-15 DIAGNOSIS — M75101 Unspecified rotator cuff tear or rupture of right shoulder, not specified as traumatic: Secondary | ICD-10-CM | POA: Diagnosis not present

## 2015-08-15 DIAGNOSIS — M79661 Pain in right lower leg: Secondary | ICD-10-CM | POA: Diagnosis not present

## 2015-08-15 DIAGNOSIS — Z87891 Personal history of nicotine dependence: Secondary | ICD-10-CM | POA: Diagnosis not present

## 2015-08-15 NOTE — OR Nursing (Signed)
Patient in for pre-admission labs. EKG abnormal, comparison to 11/18/2012 EKG viewed by Dr. Lorrene Reid. OK for surgery at [facility] per Dr. Al Corpus. Sherron Monday, RN BSN CAPA, AD/[facility]SC.

## 2015-08-17 ENCOUNTER — Other Ambulatory Visit: Payer: Self-pay | Admitting: Orthopedic Surgery

## 2015-08-17 ENCOUNTER — Encounter (HOSPITAL_BASED_OUTPATIENT_CLINIC_OR_DEPARTMENT_OTHER)
Admission: RE | Admit: 2015-08-17 | Discharge: 2015-08-17 | Disposition: A | Discharge status: Home / Self Care | Payer: PRIVATE HEALTH INSURANCE | Source: Ambulatory Visit | Attending: Orthopedic Surgery | Admitting: Orthopedic Surgery

## 2015-08-17 DIAGNOSIS — M75101 Unspecified rotator cuff tear or rupture of right shoulder, not specified as traumatic: Secondary | ICD-10-CM | POA: Diagnosis not present

## 2015-08-17 LAB — BASIC METABOLIC PANEL
Anion gap: 14 (ref 5–15)
BUN: 9 mg/dL (ref 6–20)
CALCIUM: 9.5 mg/dL (ref 8.9–10.3)
CO2: 26 mmol/L (ref 22–32)
CREATININE: 1.12 mg/dL (ref 0.61–1.24)
Chloride: 99 mmol/L — ABNORMAL LOW (ref 101–111)
GFR calc Af Amer: >60 mL/min (ref >60)
GLUCOSE: 190 mg/dL — AB (ref 65–99)
POTASSIUM: 3.9 mmol/L (ref 3.5–5.1)
SODIUM: 139 mmol/L (ref 135–145)

## 2015-08-17 NOTE — Progress Notes (Signed)
Dr. Smith Robert reviewed EKG and history - ok for surgery

## 2015-08-18 ENCOUNTER — Other Ambulatory Visit: Payer: Self-pay | Admitting: Orthopedic Surgery

## 2015-08-21 ENCOUNTER — Encounter (HOSPITAL_BASED_OUTPATIENT_CLINIC_OR_DEPARTMENT_OTHER): Payer: Self-pay

## 2015-08-21 ENCOUNTER — Encounter (HOSPITAL_BASED_OUTPATIENT_CLINIC_OR_DEPARTMENT_OTHER)
Admission: RE | Disposition: A | Discharge status: Home / Self Care | Payer: Self-pay | Source: Ambulatory Visit | Attending: Orthopedic Surgery

## 2015-08-21 ENCOUNTER — Ambulatory Visit (HOSPITAL_BASED_OUTPATIENT_CLINIC_OR_DEPARTMENT_OTHER)
Admission: RE | Admit: 2015-08-21 | Discharge: 2015-08-21 | Disposition: A | Discharge status: Home / Self Care | Payer: Medicare Other | Source: Ambulatory Visit | Attending: Orthopedic Surgery | Admitting: Orthopedic Surgery

## 2015-08-21 ENCOUNTER — Ambulatory Visit (HOSPITAL_BASED_OUTPATIENT_CLINIC_OR_DEPARTMENT_OTHER): Payer: Medicare Other | Admitting: Anesthesiology

## 2015-08-21 DIAGNOSIS — E785 Hyperlipidemia, unspecified: Secondary | ICD-10-CM | POA: Diagnosis not present

## 2015-08-21 DIAGNOSIS — Z87891 Personal history of nicotine dependence: Secondary | ICD-10-CM | POA: Insufficient documentation

## 2015-08-21 DIAGNOSIS — M4806 Spinal stenosis, lumbar region: Secondary | ICD-10-CM | POA: Insufficient documentation

## 2015-08-21 DIAGNOSIS — E119 Type 2 diabetes mellitus without complications: Secondary | ICD-10-CM | POA: Insufficient documentation

## 2015-08-21 DIAGNOSIS — M75101 Unspecified rotator cuff tear or rupture of right shoulder, not specified as traumatic: Secondary | ICD-10-CM | POA: Insufficient documentation

## 2015-08-21 DIAGNOSIS — Z6838 Body mass index (BMI) 38.0-38.9, adult: Secondary | ICD-10-CM | POA: Insufficient documentation

## 2015-08-21 DIAGNOSIS — M109 Gout, unspecified: Secondary | ICD-10-CM | POA: Insufficient documentation

## 2015-08-21 DIAGNOSIS — I10 Essential (primary) hypertension: Secondary | ICD-10-CM | POA: Diagnosis not present

## 2015-08-21 DIAGNOSIS — Z7984 Long term (current) use of oral hypoglycemic drugs: Secondary | ICD-10-CM | POA: Insufficient documentation

## 2015-08-21 DIAGNOSIS — M47816 Spondylosis without myelopathy or radiculopathy, lumbar region: Secondary | ICD-10-CM | POA: Insufficient documentation

## 2015-08-21 DIAGNOSIS — M79661 Pain in right lower leg: Secondary | ICD-10-CM | POA: Insufficient documentation

## 2015-08-21 DIAGNOSIS — M25551 Pain in right hip: Secondary | ICD-10-CM | POA: Insufficient documentation

## 2015-08-21 DIAGNOSIS — G8929 Other chronic pain: Secondary | ICD-10-CM | POA: Insufficient documentation

## 2015-08-21 DIAGNOSIS — M5136 Other intervertebral disc degeneration, lumbar region: Secondary | ICD-10-CM | POA: Insufficient documentation

## 2015-08-21 DIAGNOSIS — Z7982 Long term (current) use of aspirin: Secondary | ICD-10-CM | POA: Insufficient documentation

## 2015-08-21 DIAGNOSIS — Z887 Allergy status to serum and vaccine status: Secondary | ICD-10-CM | POA: Insufficient documentation

## 2015-08-21 HISTORY — PX: SHOULDER ARTHROSCOPY WITH ROTATOR CUFF REPAIR AND SUBACROMIAL DECOMPRESSION: SHX5686

## 2015-08-21 HISTORY — DX: Unspecified rotator cuff tear or rupture of unspecified shoulder, not specified as traumatic: M75.100

## 2015-08-21 LAB — GLUCOSE, CAPILLARY
GLUCOSE-CAPILLARY: 146 mg/dL — AB (ref 65–99)
GLUCOSE-CAPILLARY: 133 mg/dL — AB (ref 65–99)

## 2015-08-21 SURGERY — SHOULDER ARTHROSCOPY WITH ROTATOR CUFF REPAIR AND SUBACROMIAL DECOMPRESSION
Anesthesia: Regional | Site: Shoulder | Laterality: Right

## 2015-08-21 MED ORDER — SCOPOLAMINE 1 MG/3DAYS TD PT72: 1.0000 | MEDICATED_PATCH | Freq: Once | TRANSDERMAL | Status: DC | PRN

## 2015-08-21 MED ORDER — PROPOFOL 10 MG/ML IV BOLUS
INTRAVENOUS | Status: AC
  Filled 2015-08-21: qty 20

## 2015-08-21 MED ORDER — OXYCODONE HCL 5 MG/5ML PO SOLN: 5.0000 mg | Freq: Once | ORAL | Status: DC | PRN

## 2015-08-21 MED ORDER — PROMETHAZINE HCL 25 MG/ML IJ SOLN
INTRAMUSCULAR | Status: AC
  Filled 2015-08-21: qty 1

## 2015-08-21 MED ORDER — OXYCODONE-ACETAMINOPHEN 5-325 MG PO TABS: 1.0000 | ORAL_TABLET | ORAL | Status: DC | PRN

## 2015-08-21 MED ORDER — OXYCODONE HCL 5 MG PO TABS: 5.0000 mg | ORAL_TABLET | Freq: Once | ORAL | Status: DC | PRN

## 2015-08-21 MED ORDER — SODIUM CHLORIDE 0.9 % IJ SOLN
INTRAMUSCULAR | Status: AC
  Filled 2015-08-21: qty 10

## 2015-08-21 MED ORDER — CHLORHEXIDINE GLUCONATE 4 % EX LIQD: 60.0000 mL | Freq: Once | CUTANEOUS | Status: DC

## 2015-08-21 MED ORDER — ONDANSETRON HCL 4 MG/2ML IJ SOLN
INTRAMUSCULAR | Status: AC
  Filled 2015-08-21: qty 2

## 2015-08-21 MED ORDER — FENTANYL CITRATE (PF) 100 MCG/2ML IJ SOLN
INTRAMUSCULAR | Status: AC
  Filled 2015-08-21: qty 2

## 2015-08-21 MED ORDER — CEFAZOLIN SODIUM-DEXTROSE 2-3 GM-% IV SOLR
2.0000 g | INTRAVENOUS | Status: AC
  Administered 2015-08-21: 3 g via INTRAVENOUS

## 2015-08-21 MED ORDER — LIDOCAINE HCL (CARDIAC) 20 MG/ML IV SOLN
INTRAVENOUS | Status: AC
  Filled 2015-08-21: qty 5

## 2015-08-21 MED ORDER — PROPOFOL 10 MG/ML IV BOLUS
INTRAVENOUS | Status: DC | PRN
  Administered 2015-08-21: 250 mg via INTRAVENOUS

## 2015-08-21 MED ORDER — MIDAZOLAM HCL 2 MG/2ML IJ SOLN
1.0000 mg | INTRAMUSCULAR | Status: DC | PRN
  Administered 2015-08-21: 2 mg via INTRAVENOUS

## 2015-08-21 MED ORDER — FENTANYL CITRATE (PF) 100 MCG/2ML IJ SOLN
50.0000 ug | INTRAMUSCULAR | Status: DC | PRN
  Administered 2015-08-21: 50 ug via INTRAVENOUS

## 2015-08-21 MED ORDER — SODIUM CHLORIDE 0.9 % IR SOLN
Status: DC | PRN
  Administered 2015-08-21: 12000 mL

## 2015-08-21 MED ORDER — ARTIFICIAL TEARS OP OINT
TOPICAL_OINTMENT | OPHTHALMIC | Status: AC
  Filled 2015-08-21: qty 3.5

## 2015-08-21 MED ORDER — CEFAZOLIN SODIUM-DEXTROSE 2-3 GM-% IV SOLR
INTRAVENOUS | Status: AC
  Filled 2015-08-21: qty 100

## 2015-08-21 MED ORDER — DOCUSATE SODIUM 100 MG PO CAPS: 100.0000 mg | ORAL_CAPSULE | Freq: Three times a day (TID) | ORAL | Status: DC | PRN

## 2015-08-21 MED ORDER — CEFAZOLIN SODIUM 1-5 GM-% IV SOLN
INTRAVENOUS | Status: AC
  Filled 2015-08-21: qty 50

## 2015-08-21 MED ORDER — GLYCOPYRROLATE 0.2 MG/ML IJ SOLN: 0.2000 mg | Freq: Once | INTRAMUSCULAR | Status: DC | PRN

## 2015-08-21 MED ORDER — HYDROMORPHONE HCL 1 MG/ML IJ SOLN: 0.2500 mg | INTRAMUSCULAR | Status: DC | PRN

## 2015-08-21 MED ORDER — SUCCINYLCHOLINE CHLORIDE 20 MG/ML IJ SOLN
INTRAMUSCULAR | Status: DC | PRN
  Administered 2015-08-21: 100 mg via INTRAVENOUS

## 2015-08-21 MED ORDER — MIDAZOLAM HCL 2 MG/2ML IJ SOLN
INTRAMUSCULAR | Status: AC
  Filled 2015-08-21: qty 2

## 2015-08-21 MED ORDER — ONDANSETRON HCL 4 MG/2ML IJ SOLN
INTRAMUSCULAR | Status: DC | PRN
  Administered 2015-08-21: 4 mg via INTRAVENOUS

## 2015-08-21 MED ORDER — PROMETHAZINE HCL 25 MG/ML IJ SOLN
6.2500 mg | INTRAMUSCULAR | Status: DC | PRN
  Administered 2015-08-21: 6.25 mg via INTRAVENOUS

## 2015-08-21 MED ORDER — LACTATED RINGERS IV SOLN
INTRAVENOUS | Status: DC
  Administered 2015-08-21 (×3): via INTRAVENOUS

## 2015-08-21 MED ORDER — DEXAMETHASONE SODIUM PHOSPHATE 10 MG/ML IJ SOLN
INTRAMUSCULAR | Status: AC
  Filled 2015-08-21: qty 1

## 2015-08-21 SURGICAL SUPPLY — 85 items
ANCH SUT SWLK 19.1X4.75 VT (Anchor) ×5 IMPLANT
ANCHOR PEEK 4.75X19.1 SWLK C (Anchor) ×5 IMPLANT
APL SKNCLS STERI-STRIP NONHPOA (GAUZE/BANDAGES/DRESSINGS)
BENZOIN TINCTURE PRP APPL 2/3 (GAUZE/BANDAGES/DRESSINGS) IMPLANT
BLADE CLIPPER SURG (BLADE) IMPLANT
BLADE SURG 15 STRL LF DISP TIS (BLADE) IMPLANT
BLADE SURG 15 STRL SS (BLADE)
BUR OVAL 4.0 (BURR) ×2 IMPLANT
CANNULA 5.75X71 LONG (CANNULA) ×2 IMPLANT
CANNULA TWIST IN 8.25X7CM (CANNULA) ×1 IMPLANT
CHLORAPREP W/TINT 26ML (MISCELLANEOUS) ×2 IMPLANT
DECANTER SPIKE VIAL GLASS SM (MISCELLANEOUS) IMPLANT
DRAPE INCISE IOBAN 66X45 STRL (DRAPES) ×2 IMPLANT
DRAPE STERI 35X30 U-POUCH (DRAPES) ×2 IMPLANT
DRAPE SURG 17X23 STRL (DRAPES) ×2 IMPLANT
DRAPE U 20/CS (DRAPES) ×2 IMPLANT
DRAPE U-SHAPE 47X51 STRL (DRAPES) ×2 IMPLANT
DRAPE U-SHAPE 76X120 STRL (DRAPES) ×4 IMPLANT
DRSG PAD ABDOMINAL 8X10 ST (GAUZE/BANDAGES/DRESSINGS) ×2 IMPLANT
ELECT REM PT RETURN 9FT ADLT (ELECTROSURGICAL) ×2
ELECTRODE REM PT RTRN 9FT ADLT (ELECTROSURGICAL) ×1 IMPLANT
GAUZE SPONGE 4X4 12PLY STRL (GAUZE/BANDAGES/DRESSINGS) ×2 IMPLANT
GAUZE SPONGE 4X4 16PLY XRAY LF (GAUZE/BANDAGES/DRESSINGS) IMPLANT
GAUZE XEROFORM 1X8 LF (GAUZE/BANDAGES/DRESSINGS) ×2 IMPLANT
GLOVE BIO SURGEON STRL SZ7 (GLOVE) ×2 IMPLANT
GLOVE BIO SURGEON STRL SZ7.5 (GLOVE) ×2 IMPLANT
GLOVE BIOGEL PI IND STRL 7.0 (GLOVE) ×1 IMPLANT
GLOVE BIOGEL PI IND STRL 8 (GLOVE) ×1 IMPLANT
GLOVE BIOGEL PI INDICATOR 7.0 (GLOVE) ×2
GLOVE BIOGEL PI INDICATOR 8 (GLOVE) ×1
GLOVE ECLIPSE 6.5 STRL STRAW (GLOVE) ×1 IMPLANT
GOWN STRL REUS W/ TWL LRG LVL3 (GOWN DISPOSABLE) ×2 IMPLANT
GOWN STRL REUS W/ TWL XL LVL3 (GOWN DISPOSABLE) ×1 IMPLANT
GOWN STRL REUS W/TWL LRG LVL3 (GOWN DISPOSABLE) ×4
GOWN STRL REUS W/TWL XL LVL3 (GOWN DISPOSABLE) ×2
IMMOBILIZER SHOULDER FOAM XLGE (SOFTGOODS) ×1 IMPLANT
LASSO CRESCENT QUICKPASS (SUTURE) IMPLANT
LIQUID BAND (GAUZE/BANDAGES/DRESSINGS) IMPLANT
MANIFOLD NEPTUNE II (INSTRUMENTS) ×2 IMPLANT
NDL 1/2 CIR CATGUT .05X1.09 (NEEDLE) IMPLANT
NDL SUT 6 .5 CRC .975X.05 MAYO (NEEDLE) IMPLANT
NEEDLE 1/2 CIR CATGUT .05X1.09 (NEEDLE) IMPLANT
NEEDLE MAYO TAPER (NEEDLE)
NEEDLE SCORPION MULTI FIRE (NEEDLE) ×2 IMPLANT
NS IRRIG 1000ML POUR BTL (IV SOLUTION) IMPLANT
PACK ARTHROSCOPY DSU (CUSTOM PROCEDURE TRAY) ×2 IMPLANT
PACK BASIN DAY SURGERY FS (CUSTOM PROCEDURE TRAY) ×2 IMPLANT
PENCIL BUTTON HOLSTER BLD 10FT (ELECTRODE) IMPLANT
RESECTOR FULL RADIUS 4.2MM (BLADE) ×2 IMPLANT
SHEET MEDIUM DRAPE 40X70 STRL (DRAPES) IMPLANT
SLEEVE SCD COMPRESS KNEE MED (MISCELLANEOUS) ×2 IMPLANT
SLING ARM IMMOBILIZER MED (SOFTGOODS) IMPLANT
SLING ARM LRG ADULT FOAM STRAP (SOFTGOODS) IMPLANT
SLING ARM MED ADULT FOAM STRAP (SOFTGOODS) IMPLANT
SLING ARM XL FOAM STRAP (SOFTGOODS) IMPLANT
SPONGE LAP 4X18 X RAY DECT (DISPOSABLE) IMPLANT
STRIP CLOSURE SKIN 1/2X4 (GAUZE/BANDAGES/DRESSINGS) IMPLANT
SUCTION FRAZIER TIP 10 FR DISP (SUCTIONS) IMPLANT
SUPPORT WRAP ARM LG (MISCELLANEOUS) IMPLANT
SUT BONE WAX W31G (SUTURE) IMPLANT
SUT ETHIBOND 2 OS 4 DA (SUTURE) IMPLANT
SUT ETHILON 3 0 PS 1 (SUTURE) ×2 IMPLANT
SUT ETHILON 4 0 PS 2 18 (SUTURE) IMPLANT
SUT FIBERWIRE #2 38 T-5 BLUE (SUTURE)
SUT FIBERWIRE 2-0 18 17.9 3/8 (SUTURE)
SUT MNCRL AB 3-0 PS2 18 (SUTURE) IMPLANT
SUT MNCRL AB 4-0 PS2 18 (SUTURE) IMPLANT
SUT PDS AB 0 CT 36 (SUTURE) IMPLANT
SUT PROLENE 3 0 PS 2 (SUTURE) IMPLANT
SUT TIGER TAPE 7 IN WHITE (SUTURE) ×2 IMPLANT
SUT VIC AB 0 CT1 27 (SUTURE)
SUT VIC AB 0 CT1 27XBRD ANBCTR (SUTURE) IMPLANT
SUT VIC AB 2-0 SH 27 (SUTURE)
SUT VIC AB 2-0 SH 27XBRD (SUTURE) IMPLANT
SUTURE FIBERWR #2 38 T-5 BLUE (SUTURE) IMPLANT
SUTURE FIBERWR 2-0 18 17.9 3/8 (SUTURE) IMPLANT
SYR BULB 3OZ (MISCELLANEOUS) IMPLANT
TAPE FIBER 2MM 7IN #2 BLUE (SUTURE) ×1 IMPLANT
TOWEL OR 17X24 6PK STRL BLUE (TOWEL DISPOSABLE) ×2 IMPLANT
TOWEL OR NON WOVEN STRL DISP B (DISPOSABLE) ×2 IMPLANT
TUBE CONNECTING 20X1/4 (TUBING) ×3 IMPLANT
TUBING ARTHROSCOPY IRRIG 16FT (MISCELLANEOUS) ×2 IMPLANT
WAND STAR VAC 90 (SURGICAL WAND) ×2 IMPLANT
WATER STERILE IRR 1000ML POUR (IV SOLUTION) ×2 IMPLANT
YANKAUER SUCT BULB TIP NO VENT (SUCTIONS) IMPLANT

## 2015-08-21 NOTE — Transfer of Care (Signed)
Immediate Anesthesia Transfer of Care Note  Patient: Oscar [name]  Procedure(s) Performed: Procedure(s) with comments: SHOULDER ARTHROSCOPY WITH ROTATOR CUFF REPAIR S UBACROMIAL DECOMPRESSION,  (Right) - Right shoulder arthroscopy rotator cuff repair vs debridement, subacromial decompression, possible biceps tenotomy.  Patient Location: PACU  Anesthesia Type:GA combined with regional for post-op pain  Level of Consciousness: awake and patient cooperative  Airway & Oxygen Therapy: Patient Spontanous Breathing and Patient connected to face mask oxygen  Post-op Assessment: Report given to RN and Post -op Vital signs reviewed and stable  Post vital signs: Reviewed and stable  Last Vitals:  Filed Vitals:   08/21/15 1230  BP:   Pulse: 79  Temp:   Resp: 13    Complications: No apparent anesthesia complications

## 2015-08-21 NOTE — Progress Notes (Signed)
Assisted Dr. Rodman Comp with right, ultrasound guided, interscalene  block. Side rails up, monitors on throughout procedure. See vital signs in flow sheet. Tolerated Procedure well.

## 2015-08-21 NOTE — Op Note (Signed)
Procedure(s): SHOULDER ARTHROSCOPY WITH ROTATOR CUFF REPAIR S UBACROMIAL DECOMPRESSION,  Procedure Note  DJON TITH male 69 y.o. 08/21/2015  Procedure(s) and Anesthesia Type:    * SHOULDER ARTHROSCOPY WITH ROTATOR CUFF REPAIR S UBACROMIAL DECOMPRESSION,  - General  Surgeon(s) and Role:    * Tania Ade, MD - Primary     Surgeon: Nita Sells   Assistants: Jeanmarie Hubert PA-C (Danielle was present and scrubbed throughout the procedure and was essential in positioning, assisting with the camera and instrumentation,, and closure)  Anesthesia: General endotracheal anesthesia with preoperative interscalene block given by the attending anesthesiologist    Procedure Detail  SHOULDER ARTHROSCOPY WITH ROTATOR Bell Arthur,   Estimated Blood Loss: Min         Drains: none  Blood Given: none         Specimens: none        Complications:  * No complications entered in OR log *         Disposition: PACU - hemodynamically stable.         Condition: stable    Procedure:   INDICATIONS FOR SURGERY: The patient is 69 y.o. male who has had a long history of right shoulder pain which is failed conservative management. He was found on MRI to have a large rotator cuff tear. He is indicated for surgical treatment to try and decrease pain and restore function. He understood risks benefits alternatives to the procedure and wished to go forward with surgery.  OPERATIVE FINDINGS: Examination under anesthesia: No stiffness or instability   DESCRIPTION OF PROCEDURE: The patient was identified in preoperative  holding area where I personally marked the operative site after  verifying site, side, and procedure with the patient. An interscalene block was given by the attending anesthesiologist the holding area.  The patient was taken back to the operating room where general anesthesia was induced without complication and was placed in the  beach-chair position with the back  elevated about 60 degrees and all extremities and head and neck carefully padded and  positioned.   The right upper extremity was then prepped and  draped in a standard sterile fashion. The appropriate time-out  procedure was carried out. The patient did receive IV antibiotics  within 30 minutes of incision.   A small posterior portal incision was made and the arthroscope was introduced into the joint. An anterior portal was then established above the subscapularis using needle localization. Small cannula was placed anteriorly. Diagnostic arthroscopy was then carried out.  Subscapularis was intact. The biceps tendon was already noted to be torn and not present in the shoulder. There were some circumferential degenerative changes of the labrum which were debrided with shaver. All humeral joint surfaces were largely intact without significant chondromalacia. Supraspinatus was torn into the infraspinatus with retraction to the medial humeral head. This was lightly debrided from the undersurface.  The arthroscope was then introduced into the subacromial space a standard lateral portal was established with needle localization. The shaver was used through the lateral portal to perform extensive bursectomy. Coracoacromial ligament was examined and found to be frayed indicating chronic impingement..  The bursal side of the rotator cuff was carefully examined. He had a large tear of the entire supraspinatus into the infraspinatus retracted to the medial aspect of the humeral head. Tissue quality was noted to be quite good. Bone quality was also felt to be quite good. With gentle retraction on the tear was able to bring it  back over the tuberosity and I felt that formal repair would be beneficial to the patient. Therefore a large cannula was placed laterally. The camera was moved to posterior viewing portal. The anterior cannula was placed in the subacromial space. The bur  was used to bur down the tuberosity down to bleeding bone and then 3 separate 4.75 swivel lock anchors were placed each loaded with a fiber tape or Tiger tape along the medial row evenly spaced. The 6 suture strands were then passed evenly horizontal mattress configuration throughout the tear. These were then brought over to lateral 4.75 swivel lock anchors anterior and posterior creating a nice crossing suture bridge pattern bringing the tendon nicely down over the prepared tuberosity. The repair was felt to be even and under no undue tension. This created a watertight repair.  The coracoacromial ligament was taken down off the anterior acromion with the ArthroCare exposing a large hooked anterior acromial spur. A high-speed bur was then used through the lateral portal to take down the anterior acromial spur from lateral to medial in a standard acromioplasty.  The acromioplasty was also viewed from the lateral portal and the bur was used as necessary to ensure that the acromion was completely flat from posterior to anterior.  The arthroscopic equipment was removed from the joint and the portals were closed with 3-0 nylon in an interrupted fashion. Sterile dressings were then applied including Xeroform 4 x 4's ABDs and tape. The patient was then allowed to awaken from general anesthesia, placed in a sling, transferred to the stretcher and taken to the recovery room in stable condition.   POSTOPERATIVE PLAN: The patient will be discharged home today and will followup in one week for suture removal and wound check.  He will follow the conservative protocol.

## 2015-08-21 NOTE — H&P (Signed)
Oscar [name] is an 69 y.o. male.   Chief Complaint: R shoulder pain and dysfunction HPI: R shoulder large retracted rotator cuff tear with pain and dysfunction  Past Medical History  Diagnosis Date  . Diabetes mellitus without complication ([place])   . Hyperlipidemia   . Hypertension   . Chronic right hip pain     and right leg pain  . Swelling of extremity     bil feet  . Gout     bil ankles  . Shortness of breath   . Dysrhythmia     pt. reports that he has been told in the past that he has  irreg. heattbeat   . Arthritis     lumbar, spondylosis, DDD,stenosis   . Rotator cuff tear     right    Past Surgical History  Procedure Laterality Date  . Elbow bursa surgery      removed bursacs both elbows  . Cardiac catheterization      2004, outcome good, to be followed for/with medical therapy   . Back surgery      January 2014    Family History  Problem Relation Age of Onset  . Heart disease Father   . Diabetes Father   . Heart disease Brother   . Diabetes Paternal Grandmother   . Colon cancer Neg Hx   . Esophageal cancer Neg Hx   . Rectal cancer Neg Hx   . Stomach cancer Neg Hx    Social History:  reports that he quit smoking about 41 years ago. His smoking use included Cigarettes. He has a 3 pack-year smoking history. He has never used smokeless tobacco. He reports that he drinks alcohol. He reports that he does not use illicit drugs.  Allergies:  Allergies  Allergen Reactions  . Yellow Fever Vaccine Hives    Medications Prior to Admission  Medication Sig Dispense Refill  . allopurinol (ZYLOPRIM) 300 MG tablet Take 300 mg by mouth daily before breakfast.     . aspirin 81 MG tablet Take 81 mg by mouth daily.    . colchicine 0.6 MG tablet Take 0.6 mg by mouth daily with breakfast.     . losartan (COZAAR) 50 MG tablet Take 50 mg by mouth daily before breakfast.     . metFORMIN (GLUCOPHAGE) 500 MG tablet Take 500 mg by mouth 2 (two) times daily with a meal.    .  nabumetone (RELAFEN) 500 MG tablet Take 500 mg by mouth daily.    . pravastatin (PRAVACHOL) 40 MG tablet Take 40 mg by mouth daily with breakfast.     . valACYclovir (VALTREX) 500 MG tablet Take 500 mg by mouth daily with supper.     . furosemide (LASIX) 40 MG tablet Take 40 mg by mouth daily. Takes 1/2 tablet daily      Results for orders placed or performed during the hospital encounter of 08/21/15 (from the past 48 hour(s))  Glucose, capillary     Status: Abnormal   Collection Time: 08/21/15 11:46 AM  Result Value Ref Range   Glucose-Capillary 146 (H) 65 - 99 mg/dL   No results found.  Review of Systems  All other systems reviewed and are negative.   Blood pressure 120/79, pulse 86, temperature 98.7 F (37.1 C), temperature source Oral, resp. rate 9, height 6' (1.829 m), weight 129.332 kg (285 lb 2 oz), SpO2 98 %. Physical Exam  Constitutional: He is oriented to person, place, and time. He appears well-developed and well-nourished.  HENT:  Head: Atraumatic.  Eyes: EOM are normal.  Cardiovascular: Intact distal pulses.   Respiratory: Effort normal.  Musculoskeletal:  R shoulder pain with RC testing  Neurological: He is alert and oriented to person, place, and time.  Skin: Skin is warm and dry.  Psychiatric: He has a normal mood and affect.     Assessment/Plan R large retracted RCT Plan Arth RCR vs debridement, SAD, possible biceps tenotomy Risks / benefits of surgery discussed Consent on chart  NPO for OR Preop antibiotics   Vasti Yagi WILLIAM 08/21/2015, 12:30 PM

## 2015-08-21 NOTE — Anesthesia Procedure Notes (Addendum)
Anesthesia Regional Block:  Interscalene brachial plexus block  Pre-Anesthetic Checklist: ,, timeout performed, Correct Patient, Correct Site, Correct Laterality, Correct Procedure, Correct Position, site marked, Risks and benefits discussed,  Surgical consent,  Pre-op evaluation,  At surgeon's request and post-op pain management  Laterality: Right  Prep: chloraprep       Needles:  Injection technique: Single-shot  Needle Type: Echogenic Stimulator Needle     Needle Length: 9cm 9 cm Needle Gauge: 21 and 21 G    Additional Needles:  Procedures: ultrasound guided (picture in chart) and nerve stimulator Interscalene brachial plexus block  Nerve Stimulator or Paresthesia:  Response: deltoid, 0.48 mA,   Additional Responses:   Narrative:  Start time: 08/21/2015 11:55 AM End time: 08/21/2015 12:03 PM Injection made incrementally with aspirations every 5 mL.  Performed by: Personally  Anesthesiologist: Suzette Battiest  Additional Notes: Risks and benefits discussed. Pt tolerated well with no immediate complications.   Procedure Name: Intubation Date/Time: 08/21/2015 12:46 PM Performed by: Lyndee Leo Pre-anesthesia Checklist: Patient identified, Emergency Drugs available, Suction available and Patient being monitored Patient Re-evaluated:Patient Re-evaluated prior to inductionOxygen Delivery Method: Circle System Utilized Preoxygenation: Pre-oxygenation with 100% oxygen Intubation Type: IV induction Ventilation: Mask ventilation without difficulty Laryngoscope Size: Mac and 4 Grade View: Grade III Tube type: Oral Tube size: 8.0 mm Number of attempts: 1 Airway Equipment and Method: Stylet and Oral airway Placement Confirmation: ETT inserted through vocal cords under direct vision,  positive ETCO2 and breath sounds checked- equal and bilateral Secured at: 22 cm Tube secured with: Tape Dental Injury: Teeth and Oropharynx as per pre-operative assessment    Difficulty Due To: Difficulty was anticipated and Difficult Airway- due to anterior larynx

## 2015-08-21 NOTE — Anesthesia Preprocedure Evaluation (Addendum)
Anesthesia Evaluation  Patient identified by MRN, date of birth, ID band Patient awake    Reviewed: Allergy & Precautions, NPO status , Patient's Chart, lab work & pertinent test results  Airway Mallampati: III  TM Distance: >3 FB Neck ROM: Full    Dental  (+) Dental Advisory Given   Pulmonary former smoker,    breath sounds clear to auscultation       Cardiovascular hypertension, Pt. on medications + dysrhythmias  Rhythm:Regular Rate:Normal     Neuro/Psych negative neurological ROS     GI/Hepatic negative GI ROS, Neg liver ROS,   Endo/Other  diabetes, Type 2, Oral Hypoglycemic AgentsMorbid obesity  Renal/GU negative Renal ROS     Musculoskeletal  (+) Arthritis ,   Abdominal   Peds  Hematology negative hematology ROS (+)   Anesthesia Other Findings   Reproductive/Obstetrics                            Anesthesia Physical Anesthesia Plan  ASA: II  Anesthesia Plan: General and Regional   Post-op Pain Management: GA combined w/ Regional for post-op pain   Induction: Intravenous  Airway Management Planned: Oral ETT  Additional Equipment:   Intra-op Plan:   Post-operative Plan: Extubation in OR  Informed Consent: I have reviewed the patients History and Physical, chart, labs and discussed the procedure including the risks, benefits and alternatives for the proposed anesthesia with the patient or authorized representative who has indicated his/her understanding and acceptance.   Dental advisory given  Plan Discussed with: CRNA  Anesthesia Plan Comments:         Anesthesia Quick Evaluation

## 2015-08-21 NOTE — Discharge Instructions (Signed)
Discharge Instructions after Arthroscopic Shoulder Repair ° ° °A sling has been provided for you. Remain in your sling at all times. This includes sleeping in your sling.  °Use ice on the shoulder intermittently over the first 48 hours after surgery.  °Pain medicine has been prescribed for you.  °Use your medicine liberally over the first 48 hours, and then you can begin to taper your use. You may take Extra Strength Tylenol or Tylenol only in place of the pain pills. DO NOT take ANY nonsteroidal anti-inflammatory pain medications: Advil, Motrin, Ibuprofen, Aleve, Naproxen, or Narprosyn.  °You may remove your dressing after two days. If the incision sites are still moist, place a Band-Aid over the moist site(s). Change Band-Aids daily until dry.  °You may shower 5 days after surgery. The incisions CANNOT get wet prior to 5 days. Simply allow the water to wash over the site and then pat dry. Do not rub the incisions. Make sure your axilla (armpit) is completely dry after showering.  °Take one aspirin a day for 2 weeks after surgery, unless you have an aspirin sensitivity/ allergy or asthma. ° ° °Please call [phone] during normal business hours or [phone] after hours for any problems. Including the following: ° °- excessive redness of the incisions °- drainage for more than 4 days °- fever of more than 101.5 F ° °*Please note that pain medications will not be refilled after hours or on weekends. ° °Regional Anesthesia Blocks ° °1. Numbness or the inability to move the "blocked" extremity may last from 3-48 hours after placement. The length of time depends on the medication injected and your individual response to the medication. If the numbness is not going away after 48 hours, call your surgeon. ° °2. The extremity that is blocked will need to be protected until the numbness is gone and the  Strength has returned. Because you cannot feel it, you will need to take extra care to avoid injury. Because it may  be weak, you may have difficulty moving it or using it. You may not know what position it is in without looking at it while the block is in effect. ° °3. For blocks in the legs and feet, returning to weight bearing and walking needs to be done carefully. You will need to wait until the numbness is entirely gone and the strength has returned. You should be able to move your leg and foot normally before you try and bear weight or walk. You will need someone to be with you when you first try to ensure you do not fall and possibly risk injury. ° °4. Bruising and tenderness at the needle site are common side effects and will resolve in a few days. ° °5. Persistent numbness or new problems with movement should be communicated to the surgeon or the [facility] ([phone])/ [facility] ([phone]). ° °Post Anesthesia Home Care Instructions ° °Activity: °Get plenty of rest for the remainder of the day. A responsible adult should stay with you for 24 hours following the procedure.  °For the next 24 hours, DO NOT: °-Drive a car °-Operate machinery °-Drink alcoholic beverages °-Take any medication unless instructed by your physician °-Make any legal decisions or sign important papers. ° °Meals: °Start with liquid foods such as gelatin or soup. Progress to regular foods as tolerated. Avoid greasy, spicy, heavy foods. If nausea and/or vomiting occur, drink only clear liquids until the nausea and/or vomiting subsides. Call your physician if vomiting continues. ° °  Special Instructions/Symptoms: °Your throat may feel dry or sore from the anesthesia or the breathing tube placed in your throat during surgery. If this causes discomfort, gargle with warm salt water. The discomfort should disappear within 24 hours. ° °If you had a scopolamine patch placed behind your ear for the management of post- operative nausea and/or vomiting: ° °1. The medication in the patch is effective for 72 hours, after which it  should be removed.  Wrap patch in a tissue and discard in the trash. Wash hands thoroughly with soap and water. °2. You may remove the patch earlier than 72 hours if you experience unpleasant side effects which may include dry mouth, dizziness or visual disturbances. °3. Avoid touching the patch. Wash your hands with soap and water after contact with the patch. °  ° ° °

## 2015-08-21 NOTE — Anesthesia Postprocedure Evaluation (Signed)
  Anesthesia Post-op Note  Patient: Oscar [name]  Procedure(s) Performed: Procedure(s) with comments: SHOULDER ARTHROSCOPY WITH ROTATOR CUFF REPAIR S UBACROMIAL DECOMPRESSION,  (Right) - Right shoulder arthroscopy rotator cuff repair vs debridement, subacromial decompression, possible biceps tenotomy.  Patient Location: PACU  Anesthesia Type:GA combined with regional for post-op pain  Level of Consciousness: awake and alert   Airway and Oxygen Therapy: Patient Spontanous Breathing  Post-op Pain: none  Post-op Assessment: Post-op Vital signs reviewed              Post-op Vital Signs: Reviewed  Last Vitals:  Filed Vitals:   08/21/15 1445  BP: 114/75  Pulse: 76  Temp:   Resp: 10    Complications: No apparent anesthesia complications

## 2015-08-22 ENCOUNTER — Encounter (HOSPITAL_BASED_OUTPATIENT_CLINIC_OR_DEPARTMENT_OTHER): Payer: Self-pay | Admitting: Orthopedic Surgery

## 2015-08-22 NOTE — Addendum Note (Signed)
Addendum  created 08/22/15 1111 by Tawni Millers, CRNA   Modules edited: Charges VN

## 2016-05-28 ENCOUNTER — Telehealth: Payer: Self-pay | Admitting: Cardiovascular Disease

## 2016-05-28 NOTE — Telephone Encounter (Signed)
Received records from Levin Erp, MD for appointment with Dr Oval Linsey on 05/29/16.  Records given to [facility] (medical records) for Dr Blenda Mounts schedule on 05/29/16. lp

## 2016-05-29 ENCOUNTER — Ambulatory Visit: Payer: Medicare Other | Admitting: Cardiovascular Disease

## 2016-05-29 NOTE — Progress Notes (Deleted)
Cardiology Office Note   Date:  05/29/2016   ID:  LAWSYN ROGACKI, DOB 1946-08-30, MRN [id number]  PCP:  Oscar Peaches, MD  Cardiologist:   Oscar Latch, MD   No chief complaint on file.   History of Present Illness: Oscar [name] is a 70 y.o. male with diabetes, hyperlipidemia, hypertension, and PACs who presents for evaluation and PACs.  [name] saw his PCP, Dr. Conni Slipper, on 05/09/16 for follow-up on PACs. It was noted that he drinks a lot of sweet tea. He was started on metoprolol 7/3 which resolved the palpitations.  Past Medical History:  Diagnosis Date  . Arthritis    lumbar, spondylosis, DDD,stenosis   . Atrial premature depolarization   . Chronic right hip pain    and right leg pain  . Diabetes mellitus without complication ([place])   . Dysrhythmia    pt. reports that he has been told in the past that he has  irreg. heattbeat   . Edema   . Gout    bil ankles  . Hyperlipidemia   . Hypertension   . Rotator cuff tear    right  . Shortness of breath   . Swelling of extremity    bil feet  . Type 2 diabetes mellitus with hyperglycemia [facility])     Past Surgical History:  Procedure Laterality Date  . BACK SURGERY     January 2014  . CARDIAC CATHETERIZATION     2004, outcome good, to be followed for/with medical therapy   . ELBOW BURSA SURGERY     removed bursacs both elbows  . SHOULDER ARTHROSCOPY WITH ROTATOR CUFF REPAIR AND SUBACROMIAL DECOMPRESSION Right 08/21/2015   Procedure: SHOULDER ARTHROSCOPY WITH ROTATOR CUFF REPAIR S UBACROMIAL DECOMPRESSION, ;  Surgeon: Oscar Ade, MD;  Location: [place];  Service: Orthopedics;  Laterality: Right;  Right shoulder arthroscopy rotator cuff repair vs debridement, subacromial decompression, possible biceps tenotomy.     Current Outpatient Prescriptions  Medication Sig Dispense Refill  . allopurinol (ZYLOPRIM) 300 MG tablet Take 300 mg by mouth daily before breakfast.     . aspirin 81 MG  tablet Take 81 mg by mouth daily.    . colchicine 0.6 MG tablet Take 0.6 mg by mouth daily with breakfast.     . docusate sodium (COLACE) 100 MG capsule Take 1 capsule (100 mg total) by mouth 3 (three) times daily as needed. 20 capsule 0  . furosemide (LASIX) 40 MG tablet Take 40 mg by mouth daily. Takes 1/2 tablet daily    . losartan (COZAAR) 50 MG tablet Take 50 mg by mouth daily before breakfast.     . metFORMIN (GLUCOPHAGE) 500 MG tablet Take 500 mg by mouth 2 (two) times daily with a meal.    . nabumetone (RELAFEN) 500 MG tablet Take 500 mg by mouth daily.    Marland Kitchen oxyCODONE-acetaminophen (ROXICET) 5-325 MG tablet Take 1-2 tablets by mouth every 4 (four) hours as needed for severe pain. 60 tablet 0  . pravastatin (PRAVACHOL) 40 MG tablet Take 40 mg by mouth daily with breakfast.     . valACYclovir (VALTREX) 500 MG tablet Take 500 mg by mouth daily with supper.      No current facility-administered medications for this visit.     Allergies:   Yellow fever vaccine    Social History:  The patient  reports that he quit smoking about 42 years ago. His smoking use included Cigarettes. He has a 3.00  pack-year smoking history. He has never used smokeless tobacco. He reports that he drinks alcohol. He reports that he does not use drugs.   Family History:  The patient's ***family history includes Diabetes in his father and paternal grandmother; Heart disease in his brother and father.    ROS:  Please see the history of present illness.   Otherwise, review of systems are positive for {NONE DEFAULTED:18576::"none"}.   All other systems are reviewed and negative.    PHYSICAL EXAM: VS:  There were no vitals taken for this visit. , BMI There is no height or weight on file to calculate BMI. GENERAL:  Well appearing HEENT:  Pupils equal round and reactive, fundi not visualized, oral mucosa unremarkable NECK:  No jugular venous distention, waveform within normal limits, carotid upstroke brisk and  symmetric, no bruits, no thyromegaly LYMPHATICS:  No cervical adenopathy LUNGS:  Clear to auscultation bilaterally HEART:  RRR.  PMI not displaced or sustained,S1 and S2 within normal limits, no S3, no S4, no clicks, no rubs, *** murmurs ABD:  Flat, positive bowel sounds normal in frequency in pitch, no bruits, no rebound, no guarding, no midline pulsatile mass, no hepatomegaly, no splenomegaly EXT:  2 plus pulses throughout, no edema, no cyanosis no clubbing SKIN:  No rashes no nodules NEURO:  Cranial nerves II through XII grossly intact, motor grossly intact throughout PSYCH:  Cognitively intact, oriented to person place and time    EKG:  EKG {ACTION; IS/IS VG:[id number] ordered today. The ekg ordered today demonstrates ***   Recent Labs: 08/15/2015: BUN 9; Creatinine, Ser 1.12; Potassium 3.9; Sodium 139    Lipid Panel No results found for: CHOL, TRIG, HDL, CHOLHDL, VLDL, LDLCALC, LDLDIRECT    Wt Readings from Last 3 Encounters:  08/21/15 285 lb 2 oz (129.3 kg)  05/25/13 290 lb (131.5 kg)  05/11/13 290 lb (131.5 kg)      ASSESSMENT AND PLAN:  ***   Current medicines are reviewed at length with the patient today.  The patient {ACTIONS; HAS/DOES NOT HAVE:19233} concerns regarding medicines.  The following changes have been made:  {PLAN; NO CHANGE:13088:s}  Labs/ tests ordered today include: *** No orders of the defined types were placed in this encounter.    Disposition:   FU with ***    This note was written with the assistance of speech recognition software.  Please excuse any transcriptional errors.  Signed, Oscar Rudell C. Oval Linsey, MD, [facility]  05/29/2016 8:15 AM    [facility]

## 2016-05-30 ENCOUNTER — Encounter: Payer: Self-pay | Admitting: *Deleted

## 2016-06-28 ENCOUNTER — Encounter (INDEPENDENT_AMBULATORY_CARE_PROVIDER_SITE_OTHER): Payer: Self-pay

## 2016-06-28 ENCOUNTER — Ambulatory Visit (INDEPENDENT_AMBULATORY_CARE_PROVIDER_SITE_OTHER): Payer: Medicare Other | Admitting: Internal Medicine

## 2016-06-28 ENCOUNTER — Encounter: Payer: Self-pay | Admitting: Internal Medicine

## 2016-06-28 VITALS — BP 122/76 | HR 71 | Ht 73.0 in | Wt 292.0 lb

## 2016-06-28 DIAGNOSIS — R0600 Dyspnea, unspecified: Secondary | ICD-10-CM | POA: Diagnosis not present

## 2016-06-28 DIAGNOSIS — R9431 Abnormal electrocardiogram [ECG] [EKG]: Secondary | ICD-10-CM | POA: Diagnosis not present

## 2016-06-28 DIAGNOSIS — I491 Atrial premature depolarization: Secondary | ICD-10-CM | POA: Insufficient documentation

## 2016-06-28 DIAGNOSIS — I2510 Atherosclerotic heart disease of native coronary artery without angina pectoris: Secondary | ICD-10-CM

## 2016-06-28 DIAGNOSIS — Z8249 Family history of ischemic heart disease and other diseases of the circulatory system: Secondary | ICD-10-CM | POA: Diagnosis not present

## 2016-06-28 NOTE — Progress Notes (Signed)
OFFICE NOTE  Chief Complaint:  Dyspnea, PAC's/palpitations  Primary Care Physician: Criselda Peaches, MD  HPI:  Oscar [name] is a 70 y.o. male who is a history of moderate obesity, dyslipidemia, hypertension and type 2 diabetes. There is a strong family history of heart disease with his father who died of a massive heart attack at age 98 and a brother who died of an MI at age 70. Recently he's noted some increase in palpitations. He saw his primary care provider who did EKGs and noted he was having PACs. He was started on metoprolol tartrate 25 mg twice a day. He reports marked improvement in his symptoms over his EKG still shows PACs. Heart rate however is lower and blood pressure which was in the 0000000 and [id number] systolic is now 0000000 in the office today. Oscar [name] denies any chest pain but notes that he does get short of breath and sweats very easily with minimal exertion. Although his overweight his weight is been elevated for some time and not necessarily increasing to explain the new change in his symptoms. He's felt more fatigued recently as well. Of note he previously seen [place] cardiology in the early 2000's. He underwent heart catheterization 2002, which showed normal LV systolic function and three-vessel nonobstructive coronary disease. He was noted to have a 30% ostial LAD stenosis, a 50% first diagonal branch stenosis, and apical 70% LAD stenosis. The left circumflex had a 40% stenosis in the mid vessel and diffuse 20% distal stenosis. Right coronary artery was codominant and noted to have 50% diffuse stenosis in the mid vessel and a tubular 60% distal stenosis. Medical therapy was recommended. EKG today shows sinus rhythm with PACs and left anterior fascicular block. There was voltage criteria for LVH and lateral T-wave inversions concerning for ischemia.  PMHx:  Past Medical History:  Diagnosis Date  . Arthritis    lumbar, spondylosis, DDD,stenosis   . Atrial premature  depolarization   . Chronic right hip pain    and right leg pain  . Diabetes mellitus without complication ([place])   . Dysrhythmia    pt. reports that he has been told in the past that he has  irreg. heattbeat   . Edema   . Gout    bil ankles  . Hyperlipidemia   . Hypertension   . Rotator cuff tear    right  . Shortness of breath   . Swelling of extremity    bil feet  . Type 2 diabetes mellitus with hyperglycemia [facility])     Past Surgical History:  Procedure Laterality Date  . BACK SURGERY     January 2014  . CARDIAC CATHETERIZATION     2004, outcome good, to be followed for/with medical therapy   . ELBOW BURSA SURGERY     removed bursacs both elbows  . SHOULDER ARTHROSCOPY WITH ROTATOR CUFF REPAIR AND SUBACROMIAL DECOMPRESSION Right 08/21/2015   Procedure: SHOULDER ARTHROSCOPY WITH ROTATOR CUFF REPAIR S UBACROMIAL DECOMPRESSION, ;  Surgeon: Tania Ade, MD;  Location: [place];  Service: Orthopedics;  Laterality: Right;  Right shoulder arthroscopy rotator cuff repair vs debridement, subacromial decompression, possible biceps tenotomy.    FAMHx:  Family History  Problem Relation Age of Onset  . Heart disease Father   . Diabetes Father   . Heart disease Brother   . Diabetes Paternal Grandmother   . Colon cancer Neg Hx   . Esophageal cancer Neg Hx   . Rectal cancer Neg Hx   .  Stomach cancer Neg Hx     SOCHx:   reports that he quit smoking about 42 years ago. His smoking use included Cigarettes. He has a 3.00 pack-year smoking history. He has never used smokeless tobacco. He reports that he drinks alcohol. He reports that he does not use drugs.  ALLERGIES:  Allergies  Allergen Reactions  . Yellow Fever Vaccine Hives    ROS: Pertinent items noted in HPI and remainder of comprehensive ROS otherwise negative.  HOME MEDS: Current Outpatient Prescriptions  Medication Sig Dispense Refill  . allopurinol (ZYLOPRIM) 300 MG tablet Take 300 mg by mouth  daily before breakfast.     . aspirin 81 MG tablet Take 81 mg by mouth daily.    . colchicine 0.6 MG tablet Take 0.6 mg by mouth daily with breakfast.     . docusate sodium (COLACE) 100 MG capsule Take 1 capsule (100 mg total) by mouth 3 (three) times daily as needed. 20 capsule 0  . furosemide (LASIX) 40 MG tablet Take 40 mg by mouth daily. Takes 1/2 tablet daily    . losartan (COZAAR) 50 MG tablet Take 50 mg by mouth daily before breakfast.     . metFORMIN (GLUCOPHAGE) 500 MG tablet Take 500 mg by mouth 2 (two) times daily with a meal.    . metoprolol tartrate (LOPRESSOR) 25 MG tablet Take 1 tablet by mouth 2 (two) times daily.    . nabumetone (RELAFEN) 500 MG tablet Take 500 mg by mouth daily.    Marland Kitchen NITROSTAT 0.4 MG SL tablet Take 1 tablet by mouth as directed.    Marland Kitchen oxyCODONE-acetaminophen (ROXICET) 5-325 MG tablet Take 1-2 tablets by mouth every 4 (four) hours as needed for severe pain. 60 tablet 0  . pravastatin (PRAVACHOL) 40 MG tablet Take 40 mg by mouth daily with breakfast.     . valACYclovir (VALTREX) 500 MG tablet Take 500 mg by mouth daily with supper.      No current facility-administered medications for this visit.     LABS/IMAGING: No results found for this or any previous visit (from the past 48 hour(s)). No results found.  WEIGHTS: Wt Readings from Last 3 Encounters:  06/28/16 292 lb (132.5 kg)  08/21/15 285 lb 2 oz (129.3 kg)  05/25/13 290 lb (131.5 kg)    VITALS: BP 122/76   Pulse 71   Ht 6\' 1"  (1.854 m)   Wt 292 lb (132.5 kg)   BMI 38.52 kg/m   EXAM: General appearance: alert, no distress and moderately obese Neck: no carotid bruit and no JVD Lungs: clear to auscultation bilaterally Heart: regular rate and rhythm and Occasional extra beats Abdomen: soft, non-tender; bowel sounds normal; no masses,  no organomegaly Extremities: edema Trace sock line edema Pulses: 2+ and symmetric Skin: Skin color, texture, turgor normal. No rashes or lesions Neurologic:  Grossly normal Psych: Pleasant  EKG: Sinus rhythm with PACs and 71, left anterior fascicular block, voltage criteria for LVH and lateral T-wave inversions concerning for ischemia  ASSESSMENT: 1. Progressive fatigue and dyspnea 2. New onset symptomatic PVCs and palpitations 3. Abnormal EKG with T-wave inversions 4. History of moderate nonobstructive coronary artery disease by cath in 2002 5. Strong family history of premature coronary disease in his father and brother who died of MIs in their 41s 6. Type 2 diabetes 7. Dyslipidemia 8. Hypertension  PLAN: 1.   Mr. Parrow has new onset PACs which were symptomatic and has had improvement with metoprolol. His blood pressure is better  controlled and his PVCs are less frequent. I'm concerned about ischemia possibly related to this. He's also had some fatigue and dyspnea. EKG shows T-wave inversions. He had at least moderate multivessel coronary artery disease by cath in 2002. Results of strong family history of premature coronary disease in both his brother and father. I would like for him to undergo exercise nuclear stress testing. Based on those findings he may need additional cardiac workup.  Thanks for the kind referral. Plan to see him back to discuss the findings of his stress test in a few weeks.  Pixie Casino, MD, [facility] Attending Cardiologist [place] 06/28/2016, 10:46 AM

## 2016-06-28 NOTE — Patient Instructions (Signed)
Your physician has requested that you have an exercise stress myoview. For further information please visit [email or web address]. Please follow instruction sheet, as given.    Your physician recommends that you schedule a follow-up appointment after your stress test.   

## 2016-07-04 ENCOUNTER — Telehealth (HOSPITAL_COMMUNITY): Payer: Self-pay

## 2016-07-04 NOTE — Telephone Encounter (Signed)
Encounter complete. 

## 2016-07-09 ENCOUNTER — Ambulatory Visit (HOSPITAL_COMMUNITY)
Admission: RE | Admit: 2016-07-09 | Discharge: 2016-07-09 | Disposition: A | Discharge status: Home / Self Care | Payer: Medicare Other | Source: Ambulatory Visit | Attending: Internal Medicine | Admitting: Internal Medicine

## 2016-07-09 DIAGNOSIS — R0609 Other forms of dyspnea: Secondary | ICD-10-CM | POA: Diagnosis not present

## 2016-07-09 DIAGNOSIS — Z8249 Family history of ischemic heart disease and other diseases of the circulatory system: Secondary | ICD-10-CM | POA: Insufficient documentation

## 2016-07-09 DIAGNOSIS — E119 Type 2 diabetes mellitus without complications: Secondary | ICD-10-CM | POA: Insufficient documentation

## 2016-07-09 DIAGNOSIS — R002 Palpitations: Secondary | ICD-10-CM | POA: Insufficient documentation

## 2016-07-09 DIAGNOSIS — I2510 Atherosclerotic heart disease of native coronary artery without angina pectoris: Secondary | ICD-10-CM | POA: Insufficient documentation

## 2016-07-09 DIAGNOSIS — R0600 Dyspnea, unspecified: Secondary | ICD-10-CM

## 2016-07-09 DIAGNOSIS — I491 Atrial premature depolarization: Secondary | ICD-10-CM | POA: Insufficient documentation

## 2016-07-09 DIAGNOSIS — Z6838 Body mass index (BMI) 38.0-38.9, adult: Secondary | ICD-10-CM | POA: Insufficient documentation

## 2016-07-09 DIAGNOSIS — E669 Obesity, unspecified: Secondary | ICD-10-CM | POA: Insufficient documentation

## 2016-07-09 DIAGNOSIS — Z87891 Personal history of nicotine dependence: Secondary | ICD-10-CM | POA: Diagnosis not present

## 2016-07-09 DIAGNOSIS — I10 Essential (primary) hypertension: Secondary | ICD-10-CM | POA: Insufficient documentation

## 2016-07-09 DIAGNOSIS — R0602 Shortness of breath: Secondary | ICD-10-CM | POA: Diagnosis not present

## 2016-07-09 DIAGNOSIS — R5383 Other fatigue: Secondary | ICD-10-CM | POA: Diagnosis not present

## 2016-07-09 DIAGNOSIS — R9439 Abnormal result of other cardiovascular function study: Secondary | ICD-10-CM | POA: Insufficient documentation

## 2016-07-09 DIAGNOSIS — R9431 Abnormal electrocardiogram [ECG] [EKG]: Secondary | ICD-10-CM | POA: Insufficient documentation

## 2016-07-09 MED ORDER — TECHNETIUM TC 99M TETROFOSMIN IV KIT
31.0000 | PACK | Freq: Once | INTRAVENOUS | Status: AC | PRN
  Administered 2016-07-09: 31 via INTRAVENOUS
  Filled 2016-07-09: qty 31

## 2016-07-10 ENCOUNTER — Ambulatory Visit (HOSPITAL_COMMUNITY)
Admission: RE | Admit: 2016-07-10 | Discharge: 2016-07-10 | Disposition: A | Discharge status: Home / Self Care | Payer: Medicare Other | Source: Ambulatory Visit | Attending: Internal Medicine | Admitting: Internal Medicine

## 2016-07-10 LAB — MYOCARDIAL PERFUSION IMAGING
CHL CUP MPHR: 150 {beats}/min
CHL CUP NUCLEAR SRS: 1
CHL CUP NUCLEAR SSS: 3
CHL CUP RESTING HR STRESS: 81 {beats}/min
CSEPED: 6 min
CSEPEDS: 11 s
CSEPEW: 7.2 METS
CSEPHR: 94 %
CSEPPHR: 141 {beats}/min
LV sys vol: 63 mL
LVDIAVOL: 108 mL (ref 62–150)
RPE: 17
SDS: 2
TID: 0.77

## 2016-07-10 MED ORDER — TECHNETIUM TC 99M TETROFOSMIN IV KIT
31.3000 | PACK | Freq: Once | INTRAVENOUS | Status: AC | PRN
  Administered 2016-07-10: 31.3 via INTRAVENOUS

## 2016-07-18 ENCOUNTER — Telehealth: Payer: Self-pay | Admitting: *Deleted

## 2016-07-18 ENCOUNTER — Encounter: Payer: Self-pay | Admitting: *Deleted

## 2016-07-18 DIAGNOSIS — R9439 Abnormal result of other cardiovascular function study: Secondary | ICD-10-CM

## 2016-07-18 NOTE — Telephone Encounter (Signed)
-----   Message from Pixie Casino, MD sent at 07/15/2016  3:41 PM EDT ----- Stress test was non-ischemic. LVEF 42% - may be falsely  Low. Recommend limited echo for LV function and return to see me afterwards to discuss findings.  Dr. Lemmie Evens

## 2016-07-18 NOTE — Telephone Encounter (Signed)
Spoke with pt, aware of nuclear results. Echo scheduled for Monday 07-22-16 as he has a follow up with dr Debara Pickett on 07-24-16.

## 2016-07-18 NOTE — Telephone Encounter (Signed)
This encounter was created in error - please disregard.

## 2016-07-22 ENCOUNTER — Other Ambulatory Visit: Payer: Self-pay

## 2016-07-22 ENCOUNTER — Ambulatory Visit (HOSPITAL_COMMUNITY): Payer: Medicare Other | Attending: Cardiovascular Disease

## 2016-07-22 DIAGNOSIS — I2510 Atherosclerotic heart disease of native coronary artery without angina pectoris: Secondary | ICD-10-CM | POA: Diagnosis not present

## 2016-07-22 DIAGNOSIS — I071 Rheumatic tricuspid insufficiency: Secondary | ICD-10-CM | POA: Diagnosis not present

## 2016-07-22 DIAGNOSIS — I509 Heart failure, unspecified: Secondary | ICD-10-CM | POA: Diagnosis not present

## 2016-07-22 DIAGNOSIS — R9439 Abnormal result of other cardiovascular function study: Secondary | ICD-10-CM | POA: Diagnosis not present

## 2016-07-24 ENCOUNTER — Encounter: Payer: Self-pay | Admitting: Internal Medicine

## 2016-07-24 ENCOUNTER — Ambulatory Visit (INDEPENDENT_AMBULATORY_CARE_PROVIDER_SITE_OTHER): Payer: Medicare Other | Admitting: Internal Medicine

## 2016-07-24 VITALS — BP 130/88 | HR 72 | Ht 73.0 in | Wt 284.6 lb

## 2016-07-24 DIAGNOSIS — I2510 Atherosclerotic heart disease of native coronary artery without angina pectoris: Secondary | ICD-10-CM | POA: Diagnosis not present

## 2016-07-24 DIAGNOSIS — R0600 Dyspnea, unspecified: Secondary | ICD-10-CM | POA: Diagnosis not present

## 2016-07-24 DIAGNOSIS — R9431 Abnormal electrocardiogram [ECG] [EKG]: Secondary | ICD-10-CM

## 2016-07-24 DIAGNOSIS — I491 Atrial premature depolarization: Secondary | ICD-10-CM

## 2016-07-24 NOTE — Progress Notes (Signed)
OFFICE NOTE  Chief Complaint:  Follow-up studies  Primary Care Physician: Criselda Peaches, MD  HPI:  Oscar [name] is a 70 y.o. male who is a history of moderate obesity, dyslipidemia, hypertension and type 2 diabetes. There is a strong family history of heart disease with his father who died of a massive heart attack at age 42 and a brother who died of an MI at age 15. Recently he's noted some increase in palpitations. He saw his primary care provider who did EKGs and noted he was having PACs. He was started on metoprolol tartrate 25 mg twice a day. He reports marked improvement in his symptoms over his EKG still shows PACs. Heart rate however is lower and blood pressure which was in the 0000000 and [id number] systolic is now 0000000 in the office today. [name] denies any chest pain but notes that he does get short of breath and sweats very easily with minimal exertion. Although his overweight his weight is been elevated for some time and not necessarily increasing to explain the new change in his symptoms. He's felt more fatigued recently as well. Of note he previously seen [facility] in the early 2000's. He underwent heart catheterization 2002, which showed normal LV systolic function and three-vessel nonobstructive coronary disease. He was noted to have a 30% ostial LAD stenosis, a 50% first diagonal branch stenosis, and apical 70% LAD stenosis. The left circumflex had a 40% stenosis in the mid vessel and diffuse 20% distal stenosis. Right coronary artery was codominant and noted to have 50% diffuse stenosis in the mid vessel and a tubular 60% distal stenosis. Medical therapy was recommended. EKG today shows sinus rhythm with PACs and left anterior fascicular block. There was voltage criteria for LVH and lateral T-wave inversions concerning for ischemia.  07/24/2016  [name] returns today for follow-up of his studies. He underwent nuclear stress testing and ultimately had an echocardiogram due  to abnormal nuclear findings. Nuclear stress test showed no reversible ischemia and a fixed basal inferior defect concerning for probable artifact. LVEF was decreased at 42% however the nuclear readers suggested that the EF appeared to be normal. Subsequent echocardiography showed normal LV function without regional wall motion abnormalities and mild diastolic dysfunction. I shared those results of [name] today and feel that they're fairly reassuring. He's not describing chest pain but does get short of breath and sweats easily with minimal exertion. This could be weight related. He does note improvement with metoprolol with regards to his PACs. He says he no longer has any palpitations. He is on optimal medical therapy for diabetes, cholesterol and is on daily aspirin.  PMHx:  Past Medical History:  Diagnosis Date  . Arthritis    lumbar, spondylosis, DDD,stenosis   . Atrial premature depolarization   . Chronic right hip pain    and right leg pain  . Diabetes mellitus without complication ([place])   . Dysrhythmia    pt. reports that he has been told in the past that he has  irreg. heattbeat   . Edema   . Gout    bil ankles  . Hyperlipidemia   . Hypertension   . Rotator cuff tear    right  . Shortness of breath   . Swelling of extremity    bil feet  . Type 2 diabetes mellitus with hyperglycemia [facility])     Past Surgical History:  Procedure Laterality Date  . BACK SURGERY     January  2014  . CARDIAC CATHETERIZATION     2004, outcome good, to be followed for/with medical therapy   . ELBOW BURSA SURGERY     removed bursacs both elbows  . SHOULDER ARTHROSCOPY WITH ROTATOR CUFF REPAIR AND SUBACROMIAL DECOMPRESSION Right 08/21/2015   Procedure: SHOULDER ARTHROSCOPY WITH ROTATOR CUFF REPAIR S UBACROMIAL DECOMPRESSION, ;  Surgeon: Tania Ade, MD;  Location: [facility];  Service: Orthopedics;  Laterality: Right;  Right shoulder arthroscopy rotator cuff repair vs debridement,  subacromial decompression, possible biceps tenotomy.    FAMHx:  Family History  Problem Relation Age of Onset  . Heart disease Father   . Diabetes Father   . Heart disease Brother   . Diabetes Paternal Grandmother   . Colon cancer Neg Hx   . Esophageal cancer Neg Hx   . Rectal cancer Neg Hx   . Stomach cancer Neg Hx     SOCHx:   reports that he quit smoking about 42 years ago. His smoking use included Cigarettes. He has a 3.00 pack-year smoking history. He has never used smokeless tobacco. He reports that he drinks alcohol. He reports that he does not use drugs.  ALLERGIES:  Allergies  Allergen Reactions  . Yellow Fever Vaccine Hives    ROS: Pertinent items noted in HPI and remainder of comprehensive ROS otherwise negative.  HOME MEDS: Current Outpatient Prescriptions  Medication Sig Dispense Refill  . allopurinol (ZYLOPRIM) 300 MG tablet Take 300 mg by mouth daily before breakfast.     . aspirin 81 MG tablet Take 81 mg by mouth daily.    . colchicine 0.6 MG tablet Take 0.6 mg by mouth daily with breakfast.     . docusate sodium (COLACE) 100 MG capsule Take 1 capsule (100 mg total) by mouth 3 (three) times daily as needed. 20 capsule 0  . furosemide (LASIX) 40 MG tablet Take 40 mg by mouth daily. Takes 1/2 tablet daily    . losartan (COZAAR) 50 MG tablet Take 50 mg by mouth daily before breakfast.     . metFORMIN (GLUCOPHAGE) 500 MG tablet Take 500 mg by mouth 2 (two) times daily with a meal.    . metoprolol tartrate (LOPRESSOR) 25 MG tablet Take 1 tablet by mouth 2 (two) times daily.    . nabumetone (RELAFEN) 500 MG tablet Take 500 mg by mouth daily.    Marland Kitchen NITROSTAT 0.4 MG SL tablet Take 1 tablet by mouth as directed.    Marland Kitchen oxyCODONE-acetaminophen (ROXICET) 5-325 MG tablet Take 1-2 tablets by mouth every 4 (four) hours as needed for severe pain. 60 tablet 0  . pravastatin (PRAVACHOL) 40 MG tablet Take 40 mg by mouth daily with breakfast.     . valACYclovir (VALTREX) 500 MG  tablet Take 500 mg by mouth daily with supper.      No current facility-administered medications for this visit.     LABS/IMAGING: No results found for this or any previous visit (from the past 48 hour(s)). No results found.  WEIGHTS: Wt Readings from Last 3 Encounters:  07/24/16 284 lb 9.6 oz (129.1 kg)  07/09/16 292 lb (132.5 kg)  06/28/16 292 lb (132.5 kg)    VITALS: BP 130/88   Pulse 72   Ht 6\' 1"  (1.854 m)   Wt 284 lb 9.6 oz (129.1 kg)   BMI 37.55 kg/m   EXAM: Deferred  EKG: Deferred  ASSESSMENT: 1. Progressive fatigue and dyspnea- low risk Myoview stress test and normal LVEF by echo 2. New  onset symptomatic PVCs and palpitations 3. Abnormal EKG with T-wave inversions 4. History of moderate nonobstructive coronary artery disease by cath in 2002 5. Strong family history of premature coronary disease in his father and brother who died of MIs in their 84s 6. Type 2 diabetes 7. Dyslipidemia 8. Hypertension  PLAN: 1.   Mr. Brannon had a low risk Myoview and normal EF by echo although EF was reduced on my view this may been gating abnormality due to PACs and/or diaphragmatic attenuation artifact. He is not describing chest pain. I advised him to start with low to moderate intensity exercise, perhaps walking 3 or 4 times a week and try to increase that to work on weight loss and cardiac conditioning. If he knows any worsening symptoms or develops any chest pain, we would want to consider cardiac catheterization given his history of moderate nonobstructive coronary disease in the past. It's unlikely that any better than it was in 2002, and there is a small possibility of a false negative nuclear study.  Follow-up with me in 6 months.  Pixie Casino, MD, [facility] Attending Cardiologist [place] [facility] Braelin Costlow 07/24/2016, 4:30 PM

## 2016-07-24 NOTE — Patient Instructions (Addendum)
Medication Instructions:  Your physician recommends that you continue on your current medications as directed. Please refer to the Current Medication list given to you today.  Labwork: None Ordered  Testing/Procedures: None Ordered  Follow-Up: Your physician wants you to follow-up in: 6 months with Dr Hilty. You will receive a reminder letter in the mail two months in advance. If you don't receive a letter, please call our office to schedule the follow-up appointment.  Any Other Special Instructions Will Be Listed Below (If Applicable).    If you need a refill on your cardiac medications before your next appointment, please call your pharmacy.       

## 2017-01-21 ENCOUNTER — Ambulatory Visit (INDEPENDENT_AMBULATORY_CARE_PROVIDER_SITE_OTHER): Payer: Medicare Other | Admitting: Internal Medicine

## 2017-01-21 ENCOUNTER — Encounter: Payer: Self-pay | Admitting: Internal Medicine

## 2017-01-21 VITALS — BP 142/84 | HR 64 | Ht 73.0 in | Wt 275.0 lb

## 2017-01-21 DIAGNOSIS — R0602 Shortness of breath: Secondary | ICD-10-CM | POA: Diagnosis not present

## 2017-01-21 DIAGNOSIS — I2510 Atherosclerotic heart disease of native coronary artery without angina pectoris: Secondary | ICD-10-CM

## 2017-01-21 DIAGNOSIS — I491 Atrial premature depolarization: Secondary | ICD-10-CM | POA: Diagnosis not present

## 2017-01-21 NOTE — Patient Instructions (Signed)
Your physician recommends that you continue on your current medications as directed. Please refer to the Current Medication list given to you today.  Your physician recommends that you schedule a follow-up appointment in: 12 MONTHS WITH DR HILTY  

## 2017-01-21 NOTE — Progress Notes (Signed)
OFFICE NOTE  Chief Complaint:  Follow-up studies  Primary Care Physician: Criselda Peaches, MD  HPI:  Oscar [name] is a 71 y.o. male who is a history of moderate obesity, dyslipidemia, hypertension and type 2 diabetes. There is a strong family history of heart disease with his father who died of a massive heart attack at age 1 and a brother who died of an MI at age 39. Recently he's noted some increase in palpitations. He saw his primary care provider who did EKGs and noted he was having PACs. He was started on metoprolol tartrate 25 mg twice a day. He reports marked improvement in his symptoms over his EKG still shows PACs. Heart rate however is lower and blood pressure which was in the 509T and 267 systolic is now 124/58 in the office today. Oscar [name] denies any chest pain but notes that he does get short of breath and sweats very easily with minimal exertion. Although his overweight his weight is been elevated for some time and not necessarily increasing to explain the new change in his symptoms. He's felt more fatigued recently as well. Of note he previously seen [facility] in the early 2000's. He underwent heart catheterization 2002, which showed normal LV systolic function and three-vessel nonobstructive coronary disease. He was noted to have a 30% ostial LAD stenosis, a 50% first diagonal branch stenosis, and apical 70% LAD stenosis. The left circumflex had a 40% stenosis in the mid vessel and diffuse 20% distal stenosis. Right coronary artery was codominant and noted to have 50% diffuse stenosis in the mid vessel and a tubular 60% distal stenosis. Medical therapy was recommended. EKG today shows sinus rhythm with PACs and left anterior fascicular block. There was voltage criteria for LVH and lateral T-wave inversions concerning for ischemia.  07/24/2016  Oscar [name] returns today for follow-up of his studies. He underwent nuclear stress testing and ultimately had an echocardiogram due  to abnormal nuclear findings. Nuclear stress test showed no reversible ischemia and a fixed basal inferior defect concerning for probable artifact. LVEF was decreased at 42% however the nuclear readers suggested that the EF appeared to be normal. Subsequent echocardiography showed normal LV function without regional wall motion abnormalities and mild diastolic dysfunction. I shared those results of Oscar [name] today and feel that they're fairly reassuring. He's not describing chest pain but does get short of breath and sweats easily with minimal exertion. This could be weight related. He does note improvement with metoprolol with regards to his PACs. He says he no longer has any palpitations. He is on optimal medical therapy for diabetes, cholesterol and is on daily aspirin.  01/21/2017  Oscar [name] was seen today in follow-up. He denies any worsening shortness of breath or chest pain. His EKG is unchanged showing persistent T-wave inversions. Echo which showed mild diastolic dysfunction with normal LVEF. I've encouraged him to work on exercise and weight loss however there is not been a lot of that. He recently played golf the other weekend said that he was able to walk around without any difficulty but then had to walk several blocks to go to dinner and became a little short of breath. He reports his blood sugars been well controlled with hemoglobin A1c is 6. Cholesterol is at goal as well. He takes only half tablet of Lasix for some intermittent swelling of his legs mostly related to venous hypertension.  PMHx:  Past Medical History:  Diagnosis Date  . Arthritis  lumbar, spondylosis, DDD,stenosis   . Atrial premature depolarization   . Chronic right hip pain    and right leg pain  . Diabetes mellitus without complication ([place])   . Dysrhythmia    pt. reports that he has been told in the past that he has  irreg. heattbeat   . Edema   . Gout    bil ankles  . Hyperlipidemia   . Hypertension   .  Rotator cuff tear    right  . Shortness of breath   . Swelling of extremity    bil feet  . Type 2 diabetes mellitus with hyperglycemia [facility])     Past Surgical History:  Procedure Laterality Date  . BACK SURGERY     January 2014  . CARDIAC CATHETERIZATION     2004, outcome good, to be followed for/with medical therapy   . ELBOW BURSA SURGERY     removed bursacs both elbows  . SHOULDER ARTHROSCOPY WITH ROTATOR CUFF REPAIR AND SUBACROMIAL DECOMPRESSION Right 08/21/2015   Procedure: SHOULDER ARTHROSCOPY WITH ROTATOR CUFF REPAIR S UBACROMIAL DECOMPRESSION, ;  Surgeon: Tania Ade, MD;  Location: [place];  Service: Orthopedics;  Laterality: Right;  Right shoulder arthroscopy rotator cuff repair vs debridement, subacromial decompression, possible biceps tenotomy.    FAMHx:  Family History  Problem Relation Age of Onset  . Heart disease Father   . Diabetes Father   . Heart disease Brother   . Diabetes Paternal Grandmother   . Colon cancer Neg Hx   . Esophageal cancer Neg Hx   . Rectal cancer Neg Hx   . Stomach cancer Neg Hx     SOCHx:   reports that he quit smoking about 43 years ago. His smoking use included Cigarettes. He has a 3.00 pack-year smoking history. He has never used smokeless tobacco. He reports that he drinks alcohol. He reports that he does not use drugs.  ALLERGIES:  Allergies  Allergen Reactions  . Yellow Fever Vaccine Hives    ROS: Pertinent items noted in HPI and remainder of comprehensive ROS otherwise negative.  HOME MEDS: Current Outpatient Prescriptions  Medication Sig Dispense Refill  . allopurinol (ZYLOPRIM) 300 MG tablet Take 300 mg by mouth daily before breakfast.     . aspirin 81 MG tablet Take 81 mg by mouth daily.    . colchicine 0.6 MG tablet Take 0.6 mg by mouth daily with breakfast.     . furosemide (LASIX) 40 MG tablet Take 40 mg by mouth daily. Takes 1/2 tablet daily    . losartan (COZAAR) 50 MG tablet Take 50 mg by  mouth daily before breakfast.     . metFORMIN (GLUCOPHAGE) 500 MG tablet Take 500 mg by mouth 2 (two) times daily with a meal.    . metoprolol tartrate (LOPRESSOR) 25 MG tablet Take 1 tablet by mouth 2 (two) times daily.    . nabumetone (RELAFEN) 500 MG tablet Take 500 mg by mouth daily.    Marland Kitchen NITROSTAT 0.4 MG SL tablet Take 1 tablet by mouth as directed.    . pravastatin (PRAVACHOL) 40 MG tablet Take 40 mg by mouth daily with breakfast.     . valACYclovir (VALTREX) 500 MG tablet Take 500 mg by mouth daily with supper.      No current facility-administered medications for this visit.     LABS/IMAGING: No results found for this or any previous visit (from the past 48 hour(s)). No results found.  WEIGHTS: Wt Readings from Last  3 Encounters:  01/21/17 275 lb (124.7 kg)  07/24/16 284 lb 9.6 oz (129.1 kg)  07/09/16 292 lb (132.5 kg)    VITALS: BP (!) 142/84   Pulse 64   Ht 6\' 1"  (1.854 m)   Wt 275 lb (124.7 kg)   BMI 36.28 kg/m   EXAM: Deferred  EKG: Normal sinus rhythm at 64, anterolateral T-wave inversions  ASSESSMENT: 1. Progressive fatigue and dyspnea- low risk Myoview stress test and normal LVEF by echo 60-65% 2. PVCs and palpitations 3. Abnormal EKG with T-wave inversions 4. History of moderate nonobstructive coronary artery disease by cath in 2002 5. Strong family history of premature coronary disease in his father and brother who died of MIs in their 92s 6. Type 2 diabetes 7. Dyslipidemia 8. Hypertension  PLAN: 1.   Oscar [name] reports no new or worsening chest pain or some dramatic palpitations. His EKG is unchanged. He does need aggressive risk factor modification given his family history of heart disease. I've encouraged as I mentioned previously to work on more exercise, specifically moderate intensity aerobic exercise at least 3 times a week for 30 minutes. This will help with weight loss as well. Plan to see him back annually or sooner as necessary.   Pixie Casino, MD, [facility] Attending Cardiologist [place] Hilty 01/21/2017, 10:29 AM

## 2018-03-02 ENCOUNTER — Ambulatory Visit: Payer: Medicare Other | Admitting: Internal Medicine

## 2018-03-02 ENCOUNTER — Encounter: Payer: Self-pay | Admitting: Internal Medicine

## 2018-03-02 VITALS — BP 136/82 | HR 76 | Ht 72.0 in | Wt 291.6 lb

## 2018-03-02 DIAGNOSIS — I2510 Atherosclerotic heart disease of native coronary artery without angina pectoris: Secondary | ICD-10-CM

## 2018-03-02 DIAGNOSIS — R0989 Other specified symptoms and signs involving the circulatory and respiratory systems: Secondary | ICD-10-CM

## 2018-03-02 DIAGNOSIS — M79606 Pain in leg, unspecified: Secondary | ICD-10-CM | POA: Diagnosis not present

## 2018-03-02 DIAGNOSIS — R6889 Other general symptoms and signs: Secondary | ICD-10-CM

## 2018-03-02 DIAGNOSIS — R209 Unspecified disturbances of skin sensation: Secondary | ICD-10-CM | POA: Insufficient documentation

## 2018-03-02 NOTE — Progress Notes (Signed)
OFFICE NOTE  Chief Complaint:  Leg pain, cool feet  Primary Care Physician: Levin Erp, MD  HPI:  Oscar [name] is a 72 y.o. male who is a history of moderate obesity, dyslipidemia, hypertension and type 2 diabetes. There is a strong family history of heart disease with his father who died of a massive heart attack at age 12 and a brother who died of an MI at age 40. Recently he's noted some increase in palpitations. He saw his primary care provider who did EKGs and noted he was having PACs. He was started on metoprolol tartrate 25 mg twice a day. He reports marked improvement in his symptoms over his EKG still shows PACs. Heart rate however is lower and blood pressure which was in the 850Y and 774 systolic is now 128/78 in the office today. Oscar [name] denies any chest pain but notes that he does get short of breath and sweats very easily with minimal exertion. Although his overweight his weight is been elevated for some time and not necessarily increasing to explain the new change in his symptoms. He's felt more fatigued recently as well. Of note he previously seen [facility] in the early 2000's. He underwent heart catheterization 2002, which showed normal LV systolic function and three-vessel nonobstructive coronary disease. He was noted to have a 30% ostial LAD stenosis, a 50% first diagonal branch stenosis, and apical 70% LAD stenosis. The left circumflex had a 40% stenosis in the mid vessel and diffuse 20% distal stenosis. Right coronary artery was codominant and noted to have 50% diffuse stenosis in the mid vessel and a tubular 60% distal stenosis. Medical therapy was recommended. EKG today shows sinus rhythm with PACs and left anterior fascicular block. There was voltage criteria for LVH and lateral T-wave inversions concerning for ischemia.  07/24/2016  Oscar [name] returns today for follow-up of his studies. He underwent nuclear stress testing and ultimately had an echocardiogram due  to abnormal nuclear findings. Nuclear stress test showed no reversible ischemia and a fixed basal inferior defect concerning for probable artifact. LVEF was decreased at 42% however the nuclear readers suggested that the EF appeared to be normal. Subsequent echocardiography showed normal LV function without regional wall motion abnormalities and mild diastolic dysfunction. I shared those results of Oscar [name] today and feel that they're fairly reassuring. He's not describing chest pain but does get short of breath and sweats easily with minimal exertion. This could be weight related. He does note improvement with metoprolol with regards to his PACs. He says he no longer has any palpitations. He is on optimal medical therapy for diabetes, cholesterol and is on daily aspirin.  01/21/2017  Oscar [name] was seen today in follow-up. He denies any worsening shortness of breath or chest pain. His EKG is unchanged showing persistent T-wave inversions. Echo which showed mild diastolic dysfunction with normal LVEF. I've encouraged him to work on exercise and weight loss however there is not been a lot of that. He recently played golf the other weekend said that he was able to walk around without any difficulty but then had to walk several blocks to go to dinner and became a little short of breath. He reports his blood sugars been well controlled with hemoglobin A1c is 6. Cholesterol is at goal as well. He takes only half tablet of Lasix for some intermittent swelling of his legs mostly related to venous hypertension.  03/02/2018  Oscar [name] returns today for follow-up.  Overall he  is done well.  He denies any worsening chest pain or shortness of breath.  He has been playing some golf.  Recently has been having problems with his back.  He is followed by Dr. Sherwood Gambler.  He has had some leg pain which is unclear if it is related to his back or peripheral arterial disease.  He reports cool feet and has answered yes to some notable  lower extremity hair loss.  He has a history of hypertension and dyslipidemia as described.  His EKG actually looks somewhat better today, but does show sinus rhythm with PACs, LVH and nonspecific T wave changes laterally.  Lab work is pending from his PCP, however he said his hemoglobin A1c was 6.3 on metformin.  PMHx:  Past Medical History:  Diagnosis Date  . Arthritis    lumbar, spondylosis, DDD,stenosis   . Atrial premature depolarization   . Chronic right hip pain    and right leg pain  . Diabetes mellitus without complication ([place])   . Dysrhythmia    pt. reports that he has been told in the past that he has  irreg. heattbeat   . Edema   . Gout    bil ankles  . Hyperlipidemia   . Hypertension   . Rotator cuff tear    right  . Shortness of breath   . Swelling of extremity    bil feet  . Type 2 diabetes mellitus with hyperglycemia [facility])     Past Surgical History:  Procedure Laterality Date  . BACK SURGERY     January 2014  . CARDIAC CATHETERIZATION     2004, outcome good, to be followed for/with medical therapy   . ELBOW BURSA SURGERY     removed bursacs both elbows  . SHOULDER ARTHROSCOPY WITH ROTATOR CUFF REPAIR AND SUBACROMIAL DECOMPRESSION Right 08/21/2015   Procedure: SHOULDER ARTHROSCOPY WITH ROTATOR CUFF REPAIR S UBACROMIAL DECOMPRESSION, ;  Surgeon: Tania Ade, MD;  Location: [place];  Service: Orthopedics;  Laterality: Right;  Right shoulder arthroscopy rotator cuff repair vs debridement, subacromial decompression, possible biceps tenotomy.    FAMHx:  Family History  Problem Relation Age of Onset  . Heart disease Father   . Diabetes Father   . Heart disease Brother   . Diabetes Paternal Grandmother   . Colon cancer Neg Hx   . Esophageal cancer Neg Hx   . Rectal cancer Neg Hx   . Stomach cancer Neg Hx     SOCHx:   reports that he quit smoking about 44 years ago. His smoking use included cigarettes. He has a 3.00 pack-year smoking  history. He has never used smokeless tobacco. He reports that he drinks alcohol. He reports that he does not use drugs.  ALLERGIES:  Allergies  Allergen Reactions  . Yellow Fever Vaccine Hives    ROS: Pertinent items noted in HPI and remainder of comprehensive ROS otherwise negative.  HOME MEDS: Current Outpatient Medications  Medication Sig Dispense Refill  . allopurinol (ZYLOPRIM) 300 MG tablet Take 300 mg by mouth daily before breakfast.     . aspirin 81 MG tablet Take 81 mg by mouth daily.    . colchicine 0.6 MG tablet Take 0.6 mg by mouth daily with breakfast.     . furosemide (LASIX) 40 MG tablet Take 40 mg by mouth daily. Takes 1/2 tablet daily    . losartan (COZAAR) 50 MG tablet Take 50 mg by mouth daily before breakfast.     . metFORMIN (GLUCOPHAGE)  500 MG tablet Take 500 mg by mouth 2 (two) times daily with a meal.    . metoprolol tartrate (LOPRESSOR) 25 MG tablet Take 1 tablet by mouth 2 (two) times daily.    . nabumetone (RELAFEN) 500 MG tablet Take 500 mg by mouth daily.    Marland Kitchen NITROSTAT 0.4 MG SL tablet Take 1 tablet by mouth as directed.    . pravastatin (PRAVACHOL) 40 MG tablet Take 40 mg by mouth daily with breakfast.     . traMADol (ULTRAM) 50 MG tablet Take 1 tablet by mouth 2 (two) times daily as needed.    . valACYclovir (VALTREX) 500 MG tablet Take 500 mg by mouth daily with supper.      No current facility-administered medications for this visit.     LABS/IMAGING: No results found for this or any previous visit (from the past 48 hour(s)). No results found.  WEIGHTS: Wt Readings from Last 3 Encounters:  03/02/18 291 lb 9.6 oz (132.3 kg)  01/21/17 275 lb (124.7 kg)  07/24/16 284 lb 9.6 oz (129.1 kg)    VITALS: BP 136/82   Pulse 76   Ht 6' (1.829 m)   Wt 291 lb 9.6 oz (132.3 kg)   BMI 39.55 kg/m   EXAM: General appearance: alert, no distress and morbidly obese Neck: no carotid bruit, no JVD and thyroid not enlarged, symmetric, no  tenderness/mass/nodules Lungs: clear to auscultation bilaterally Heart: regular rate and rhythm Abdomen: soft, non-tender; bowel sounds normal; no masses,  no organomegaly and obese Extremities: extremities normal, atraumatic, no cyanosis or edema Pulses: decreased pedal pulses Skin: dry, cool, hair loss noted on legs Neurologic: Grossly normal Psych: Pleasant  EKG: Sinus rhythm with PVCs at 76, LAFB, moderate voltage criteria for LVH, nonspecific T wave changes-personally reviewed  ASSESSMENT: 1. Leg pain and decreased pulses, cool feet-question PAD 2. low risk Myoview stress test and normal LVEF by echo 60-65% (2017) 3. PVCs and palpitations 4. Abnormal EKG with T-wave inversions 5. History of moderate nonobstructive coronary artery disease by cath in 2002 6. Strong family history of premature coronary disease in his father and brother who died of MIs in their 83s 7. Type 2 diabetes 8. Dyslipidemia 9. Hypertension  PLAN: 1.   Oscar [name] denies any chest pain or worsening shortness of breath.  He had a low risk Myoview and normal LVEF by echo in 2017.  He is somewhat decreased pulses on his feet, some hair loss and reported cool feet, suggestive of possible peripheral arterial disease.  I like to obtain ABIs and peripheral Dopplers to rule out this is a possible cause of his symptoms.  Otherwise it may be attributable to lumbar spinal disease.  He is already seeing neurosurgery for this.  Will obtain labs from his primary care provider and adjust medications accordingly.  Follow-up with me annually or sooner as necessary.  Pixie Casino, MD, [facility], [place] Director of the Advanced Lipid Disorders &  Cardiovascular Risk Reduction Clinic Diplomate of the American Board of Clinical Lipidology Attending Cardiologist  Direct Dial: [phone]  Fax: [phone]  Website:  [email or web address] Osgood Aara Jacquot 03/02/2018, 1:48 PM

## 2018-03-02 NOTE — Addendum Note (Signed)
Addended by: Fidel Levy on: 03/02/2018 02:14 PM   Modules accepted: Orders

## 2018-03-02 NOTE — Patient Instructions (Addendum)
Dr. Debara Pickett has ordered lower extremity dopplers - ABI + LEA testing. This is done here in our office.   Your physician wants you to follow-up in: ONE YEAR with Dr. Debara Pickett. You will receive a reminder letter in the mail two months in advance. If you don't receive a letter, please call our office to schedule the follow-up appointment.

## 2018-03-09 ENCOUNTER — Ambulatory Visit (HOSPITAL_COMMUNITY)
Admission: RE | Admit: 2018-03-09 | Discharge: 2018-03-09 | Disposition: A | Discharge status: Home / Self Care | Payer: Medicare Other | Source: Ambulatory Visit | Attending: Cardiovascular Disease | Admitting: Cardiovascular Disease

## 2018-03-09 DIAGNOSIS — M79606 Pain in leg, unspecified: Secondary | ICD-10-CM

## 2018-03-09 DIAGNOSIS — R6889 Other general symptoms and signs: Secondary | ICD-10-CM | POA: Insufficient documentation

## 2018-03-09 DIAGNOSIS — M79604 Pain in right leg: Secondary | ICD-10-CM | POA: Insufficient documentation

## 2018-03-09 DIAGNOSIS — R0989 Other specified symptoms and signs involving the circulatory and respiratory systems: Secondary | ICD-10-CM | POA: Diagnosis present

## 2018-03-09 DIAGNOSIS — M79605 Pain in left leg: Secondary | ICD-10-CM | POA: Diagnosis not present

## 2018-03-09 DIAGNOSIS — R209 Unspecified disturbances of skin sensation: Secondary | ICD-10-CM

## 2018-04-23 ENCOUNTER — Encounter: Payer: Self-pay | Admitting: Gastroenterology

## 2018-06-29 ENCOUNTER — Encounter: Payer: Self-pay | Admitting: Gastroenterology

## 2018-07-24 ENCOUNTER — Ambulatory Visit (AMBULATORY_SURGERY_CENTER): Payer: Self-pay

## 2018-07-24 VITALS — Ht 73.0 in | Wt 287.8 lb

## 2018-07-24 DIAGNOSIS — Z86010 Personal history of colonic polyps: Secondary | ICD-10-CM

## 2018-07-24 MED ORDER — NA SULFATE-K SULFATE-MG SULF 17.5-3.13-1.6 GM/177ML PO SOLN: 1.0000 | Freq: Once | ORAL | 0 refills | Status: AC

## 2018-07-24 NOTE — Progress Notes (Signed)
Per pt, no allergies to soy or egg products.Pt not taking any weight loss meds or using  O2 at home.  Pt refused emmi video. 

## 2018-08-18 ENCOUNTER — Encounter: Payer: Self-pay | Admitting: Gastroenterology

## 2018-09-01 ENCOUNTER — Encounter: Payer: Self-pay | Admitting: Gastroenterology

## 2018-09-01 ENCOUNTER — Ambulatory Visit (AMBULATORY_SURGERY_CENTER): Payer: Medicare Other | Admitting: Gastroenterology

## 2018-09-01 VITALS — BP 120/77 | HR 74 | Temp 97.1°F | Resp 12 | Ht 72.0 in | Wt 291.0 lb

## 2018-09-01 DIAGNOSIS — D122 Benign neoplasm of ascending colon: Secondary | ICD-10-CM

## 2018-09-01 DIAGNOSIS — Z86010 Personal history of colonic polyps: Secondary | ICD-10-CM

## 2018-09-01 MED ORDER — SODIUM CHLORIDE 0.9 % IV SOLN: 500.0000 mL | Freq: Once | INTRAVENOUS | Status: DC

## 2018-09-01 NOTE — Progress Notes (Signed)
Pt's states no medical or surgical changes since previsit or office visit. 

## 2018-09-01 NOTE — Progress Notes (Signed)
Called to room to assist during endoscopic procedure.  Patient ID and intended procedure confirmed with present staff. Received instructions for my participation in the procedure from the performing physician.  

## 2018-09-01 NOTE — Progress Notes (Signed)
Spontaneous respirations throughout. VSS. Resting comfortably. To PACU on room air. Report to  RN. 

## 2018-09-01 NOTE — Op Note (Signed)
[place] Patient Name: Oscar [name] Procedure Date: 09/01/2018 8:01 AM MRN: [id number] Endoscopist: Mallie Mussel L. [name] , MD Age: 72 Referring MD:  Date of Birth: [birth year] Gender: Male Account #: [id number] Procedure:                Colonoscopy Indications:              Surveillance: Personal history of adenomatous                            polyps on last colonoscopy 5 years ago (TA x 2 , <                            82mm, 05/2013) Medicines:                Monitored Anesthesia Care Procedure:                Pre-Anesthesia Assessment:                           - Prior to the procedure, a History and Physical                            was performed, and patient medications and                            allergies were reviewed. The patient's tolerance of                            previous anesthesia was also reviewed. The risks                            and benefits of the procedure and the sedation                            options and risks were discussed with the patient.                            All questions were answered, and informed consent                            was obtained. Anticoagulants: The patient has taken                            aspirin. It was decided not to withhold this                            medication prior to the procedure. ASA Grade                            Assessment: III - A patient with severe systemic                            disease. After reviewing the risks and benefits,  the patient was deemed in satisfactory condition to                            undergo the procedure.                           After obtaining informed consent, the colonoscope                            was passed under direct vision. Throughout the                            procedure, the patient's blood pressure, pulse, and                            oxygen saturations were monitored continuously. The    Model CF-HQ190L [id number]) scope was introduced                            through the anus and advanced to the the cecum,                            identified by appendiceal orifice and ileocecal                            valve. The colonoscopy was somewhat difficult due                            to significant looping. Successful completion of                            the procedure was aided by using manual pressure.                            The patient tolerated the procedure well. The                            quality of the bowel preparation was good. The                            ileocecal valve, appendiceal orifice, and rectum                            were photographed. The quality of the bowel                            preparation was evaluated using the BBPS [facility]                            Bowel Preparation Scale) with scores of: Right                            Colon = 2, Transverse Colon = 2 and Left Colon = 2.  The total BBPS score equals 6. Scope In: 8:09:13 AM Scope Out: 8:32:03 AM Scope Withdrawal Time: 0 hours 12 minutes 3 seconds  Total Procedure Duration: 0 hours 22 minutes 50 seconds  Findings:                 The perianal and digital rectal examinations were                            normal.                           The colon (entire examined portion) was redundant.                           A 4 mm polyp was found in the ascending colon. The                            polyp was sessile. The polyp was removed with a                            cold snare. Resection and retrieval were complete.                           Multiple diverticula were found in the left colon.                           The exam was otherwise without abnormality on                            direct and retroflexion views. Complications:            No immediate complications. Estimated Blood Loss:     Estimated blood loss was minimal. Impression:                - Redundant colon.                           - One 4 mm polyp in the ascending colon, removed                            with a cold snare. Resected and retrieved.                           - Diverticulosis in the left colon.                           - The examination was otherwise normal on direct                            and retroflexion views. Recommendation:           - Patient has a contact number available for                            emergencies. The signs and symptoms of potential  delayed complications were discussed with the                            patient. Return to normal activities tomorrow.                            Written discharge instructions were provided to the                            patient.                           - Resume previous diet.                           - Continue present medications.                           - Await pathology results.                           - No repeat surveillance colonoscopy due to age. Emmalyn Hinson L. [name], MD 09/01/2018 8:37:38 AM This report has been signed electronically.

## 2018-09-01 NOTE — Patient Instructions (Signed)
YOU HAD AN ENDOSCOPIC PROCEDURE TODAY AT THE [facility]:   Refer to the procedure report that was given to you for any specific questions about what was found during the examination.  If the procedure report does not answer your questions, please call your gastroenterologist to clarify.  If you requested that your care partner not be given the details of your procedure findings, then the procedure report has been included in a sealed envelope for you to review at your convenience later.  YOU SHOULD EXPECT: Some feelings of bloating in the abdomen. Passage of more gas than usual.  Walking can help get rid of the air that was put into your GI tract during the procedure and reduce the bloating. If you had a lower endoscopy (such as a colonoscopy or flexible sigmoidoscopy) you may notice spotting of blood in your stool or on the toilet paper. If you underwent a bowel prep for your procedure, you may not have a normal bowel movement for a few days.  Please Note:  You might notice some irritation and congestion in your nose or some drainage.  This is from the oxygen used during your procedure.  There is no need for concern and it should clear up in a day or so.  SYMPTOMS TO REPORT IMMEDIATELY:   Following lower endoscopy (colonoscopy or flexible sigmoidoscopy):  Excessive amounts of blood in the stool  Significant tenderness or worsening of abdominal pains  Swelling of the abdomen that is new, acute  Fever of 100F or higher  Please see handouts given to you on Polyps and Diverticulosis.  For urgent or emergent issues, a gastroenterologist can be reached at any hour by calling [phone].   DIET:  We do recommend a small meal at first, but then you may proceed to your regular diet.  Drink plenty of fluids but you should avoid alcoholic beverages for 24 hours.  ACTIVITY:  You should plan to take it easy for the rest of today and you should NOT DRIVE or use heavy machinery until  tomorrow (because of the sedation medicines used during the test).    FOLLOW UP: Our staff will call the number listed on your records the next business day following your procedure to check on you and address any questions or concerns that you may have regarding the information given to you following your procedure. If we do not reach you, we will leave a message.  However, if you are feeling well and you are not experiencing any problems, there is no need to return our call.  We will assume that you have returned to your regular daily activities without incident.  If any biopsies were taken you will be contacted by phone or by letter within the next 1-3 weeks.  Please call us at [phone] if you have not heard about the biopsies in 3 weeks.    SIGNATURES/CONFIDENTIALITY: You and/or your care partner have signed paperwork which will be entered into your electronic medical record.  These signatures attest to the fact that that the information above on your After Visit Summary has been reviewed and is understood.  Full responsibility of the confidentiality of this discharge information lies with you and/or your care-partner.  Thank you for letting us take care of your healthcare needs today. 

## 2018-09-02 ENCOUNTER — Telehealth: Payer: Self-pay

## 2018-09-02 NOTE — Telephone Encounter (Signed)
  Follow up Call-  Call back number 09/01/2018  Post procedure Call Back phone  # [phone]  Permission to leave phone message Yes  Some recent data might be hidden     Patient questions:  Do you have a fever, pain , or abdominal swelling? No. Pain Score  0 *  Have you tolerated food without any problems? Yes.    Have you been able to return to your normal activities? Yes.    Do you have any questions about your discharge instructions: Diet   No. Medications  No. Follow up visit  No.  Do you have questions or concerns about your Care? No.  Actions: * If pain score is 4 or above: No action needed, pain <4.

## 2018-09-03 ENCOUNTER — Encounter: Payer: Self-pay | Admitting: Gastroenterology

## 2020-01-25 ENCOUNTER — Other Ambulatory Visit: Payer: Self-pay | Admitting: Dermatology

## 2020-01-27 ENCOUNTER — Other Ambulatory Visit: Payer: Self-pay

## 2020-01-27 MED ORDER — HYDROCORTISONE 2.5 % EX CREA: TOPICAL_CREAM | Freq: Two times a day (BID) | CUTANEOUS | 4 refills | Status: AC

## 2020-01-27 NOTE — Telephone Encounter (Signed)
Patient chart states to leave message on machine and Dr Denna Haggard ok'd his refill for hydrocortisone 2.5% at [employer].

## 2020-05-30 ENCOUNTER — Other Ambulatory Visit: Payer: Self-pay

## 2020-05-30 ENCOUNTER — Encounter: Payer: Self-pay | Admitting: Internal Medicine

## 2020-05-30 ENCOUNTER — Ambulatory Visit (INDEPENDENT_AMBULATORY_CARE_PROVIDER_SITE_OTHER): Payer: Medicare PPO | Admitting: Internal Medicine

## 2020-05-30 VITALS — BP 130/70 | HR 69 | Temp 96.4°F | Ht 72.0 in | Wt 281.0 lb

## 2020-05-30 DIAGNOSIS — I2510 Atherosclerotic heart disease of native coronary artery without angina pectoris: Secondary | ICD-10-CM | POA: Diagnosis not present

## 2020-05-30 DIAGNOSIS — E119 Type 2 diabetes mellitus without complications: Secondary | ICD-10-CM | POA: Diagnosis not present

## 2020-05-30 DIAGNOSIS — E782 Mixed hyperlipidemia: Secondary | ICD-10-CM

## 2020-05-30 NOTE — Progress Notes (Signed)
OFFICE NOTE  Chief Complaint:  No complaints  Primary Care Physician: Oscar Margarita, DO  HPI:  Oscar [name] is a 74 y.o. male who is a history of moderate obesity, dyslipidemia, hypertension and type 2 diabetes. There is a strong family history of heart disease with his father who died of a massive heart attack at age 80 and a brother who died of an MI at age 71. Recently he's noted some increase in palpitations. He saw his primary care provider who did EKGs and noted he was having PACs. He was started on metoprolol tartrate 25 mg twice a day. He reports marked improvement in his symptoms over his EKG still shows PACs. Heart rate however is lower and blood pressure which was in the 622W and 979 systolic is now 892/11 in the office today. Oscar [name] denies any chest pain but notes that he does get short of breath and sweats very easily with minimal exertion. Although his overweight his weight is been elevated for some time and not necessarily increasing to explain the new change in his symptoms. He's felt more fatigued recently as well. Of note he previously seen [name] cardiology in the early 2000's. He underwent heart catheterization 2002, which showed normal LV systolic function and three-vessel nonobstructive coronary disease. He was noted to have a 30% ostial LAD stenosis, a 50% first diagonal branch stenosis, and apical 70% LAD stenosis. The left circumflex had a 40% stenosis in the mid vessel and diffuse 20% distal stenosis. Right coronary artery was codominant and noted to have 50% diffuse stenosis in the mid vessel and a tubular 60% distal stenosis. Medical therapy was recommended. EKG today shows sinus rhythm with PACs and left anterior fascicular block. There was voltage criteria for LVH and lateral T-wave inversions concerning for ischemia.  07/24/2016  Oscar [name] returns today for follow-up of his studies. He underwent nuclear stress testing and ultimately had an echocardiogram due to  abnormal nuclear findings. Nuclear stress test showed no reversible ischemia and a fixed basal inferior defect concerning for probable artifact. LVEF was decreased at 42% however the nuclear readers suggested that the EF appeared to be normal. Subsequent echocardiography showed normal LV function without regional wall motion abnormalities and mild diastolic dysfunction. I shared those results of Oscar [name] today and feel that they're fairly reassuring. He's not describing chest pain but does get short of breath and sweats easily with minimal exertion. This could be weight related. He does note improvement with metoprolol with regards to his PACs. He says he no longer has any palpitations. He is on optimal medical therapy for diabetes, cholesterol and is on daily aspirin.  01/21/2017  Oscar [name] was seen today in follow-up. He denies any worsening shortness of breath or chest pain. His EKG is unchanged showing persistent T-wave inversions. Echo which showed mild diastolic dysfunction with normal LVEF. I've encouraged him to work on exercise and weight loss however there is not been a lot of that. He recently played golf the other weekend said that he was able to walk around without any difficulty but then had to walk several blocks to go to dinner and became a little short of breath. He reports his blood sugars been well controlled with hemoglobin A1c is 6. Cholesterol is at goal as well. He takes only half tablet of Lasix for some intermittent swelling of his legs mostly related to venous hypertension.  03/02/2018  Oscar [name] returns today for follow-up.  Overall he is done  well.  He denies any worsening chest pain or shortness of breath.  He has been playing some golf.  Recently has been having problems with his back.  He is followed by Oscar [name].  He has had some leg pain which is unclear if it is related to his back or peripheral arterial disease.  He reports cool feet and has answered yes to some notable  lower extremity hair loss.  He has a history of hypertension and dyslipidemia as described.  His EKG actually looks somewhat better today, but does show sinus rhythm with PACs, LVH and nonspecific T wave changes laterally.  Lab work is pending from his PCP, however he said his hemoglobin A1c was 6.3 on metformin.  05/30/2020  Oscar [name] seen today in follow-up.  Is been 2 years since I last saw him.  He has followed up with his PCP back in June.  The time his A1c was 7.9.  Cholesterol was assessed in January.  Total was 172, HDL 37, triglycerides 115 and LDL 112.  His target LDL should probably be less than 70 given diabetes and known CAD by cath in 2002.  He is on pravastatin.  We discussed the possibility of adding ezetimibe or more aggressive dietary invention and weight loss.  It does appear that he is down a few pounds since his visit in June.  He has some difficulty exercising due to back issues.  He recently was started on vitamin B12 with some improvement in what is thought to be peripheral neuropathy as he is described "cool feet" but had essentially normal lower extremity arterial Dopplers in 2019.  PMHx:  Past Medical History:  Diagnosis Date  . Anesthesia complication    PER PT, "HARD TO WAKE' UP PAST SHOULDER SURGERY IN 2016  . Arthritis    lumbar, spondylosis, DDD,stenosis   . Atrial premature depolarization   . Chronic right hip pain    and right leg pain  . Diabetes mellitus without complication ([place])   . Dysrhythmia    pt. reports that he has been told in the past that he has  irreg. heattbeat   . Edema   . Gout    bil ankles  . Hyperlipidemia   . Hypertension   . Rotator cuff tear    right  . Shortness of breath   . Swelling of extremity    bil feet  . Type 2 diabetes mellitus with hyperglycemia [facility])     Past Surgical History:  Procedure Laterality Date  . BACK SURGERY     January 2014  . CARDIAC CATHETERIZATION     2004, outcome good, to be followed for/with  medical therapy   . ELBOW BURSA SURGERY     removed bursacs both elbows  . SHOULDER ARTHROSCOPY WITH ROTATOR CUFF REPAIR AND SUBACROMIAL DECOMPRESSION Right 08/21/2015   Procedure: SHOULDER ARTHROSCOPY WITH ROTATOR CUFF REPAIR S UBACROMIAL DECOMPRESSION, ;  Surgeon: Tania Ade, MD;  Location: [facility];  Service: Orthopedics;  Laterality: Right;  Right shoulder arthroscopy rotator cuff repair vs debridement, subacromial decompression, possible biceps tenotomy.    FAMHx:  Family History  Problem Relation Age of Onset  . Heart disease Father   . Diabetes Father   . Heart disease Brother   . Diabetes Paternal Grandmother   . Colon cancer Neg Hx   . Esophageal cancer Neg Hx   . Rectal cancer Neg Hx   . Stomach cancer Neg Hx     SOCHx:  reports that he quit smoking about 46 years ago. His smoking use included cigarettes. He has a 3.00 pack-year smoking history. He has never used smokeless tobacco. He reports current alcohol use. He reports that he does not use drugs.  ALLERGIES:  Allergies  Allergen Reactions  . Yellow Fever Vaccine Hives    ROS: Pertinent items noted in HPI and remainder of comprehensive ROS otherwise negative.  HOME MEDS: Current Outpatient Medications  Medication Sig Dispense Refill  . aspirin 81 MG tablet Take 81 mg by mouth daily.    . colchicine 0.6 MG tablet Take 0.6 mg by mouth daily with breakfast.     . furosemide (LASIX) 40 MG tablet Take 20 mg by mouth. Takes 1/2 tablet daily     . hydrocortisone 2.5 % cream APPLY TO GROIN AS DIRECTED DAILY 28.35 g 0  . hydrocortisone 2.5 % cream Apply topically 2 (two) times daily. 30 g 4  . losartan (COZAAR) 50 MG tablet Take 50 mg by mouth daily before breakfast.     . metFORMIN (GLUCOPHAGE) 500 MG tablet Take 500 mg by mouth 2 (two) times daily with a meal.    . metoprolol tartrate (LOPRESSOR) 25 MG tablet Take 1 tablet by mouth 2 (two) times daily.    . nabumetone (RELAFEN) 500 MG tablet  Take 500 mg by mouth 2 (two) times daily.     Marland Kitchen NITROSTAT 0.4 MG SL tablet Take 1 tablet by mouth as directed.    . pravastatin (PRAVACHOL) 40 MG tablet Take 40 mg by mouth daily with breakfast.     . traMADol (ULTRAM) 50 MG tablet Take 1 tablet by mouth 2 (two) times daily.     . valACYclovir (VALTREX) 500 MG tablet Take 500 mg by mouth daily with supper.      No current facility-administered medications for this visit.    LABS/IMAGING: No results found for this or any previous visit (from the past 48 hour(s)). No results found.  WEIGHTS: Wt Readings from Last 3 Encounters:  05/30/20 (!) 281 lb (127.5 kg)  09/01/18 291 lb (132 kg)  07/24/18 287 lb 12.8 oz (130.5 kg)    VITALS: BP (!) 130/70   Pulse 69   Temp (!) 96.4 F (35.8 C)   Ht 6' (1.829 m)   Wt (!) 281 lb (127.5 kg)   SpO2 95%   BMI 38.11 kg/m   EXAM: General appearance: alert, no distress and morbidly obese Neck: no carotid bruit, no JVD and thyroid not enlarged, symmetric, no tenderness/mass/nodules Lungs: clear to auscultation bilaterally Heart: regular rate and rhythm Abdomen: soft, non-tender; bowel sounds normal; no masses,  no organomegaly and obese Extremities: extremities normal, atraumatic, no cyanosis or edema Pulses: decreased pedal pulses Skin: dry, cool, hair loss noted on legs Neurologic: Grossly normal Psych: Pleasant  EKG: Sinus rhythm with sinus arrhythmia 69, LAFB, voltage criteria for LVH-personally reviewed  ASSESSMENT: 1. low risk Myoview stress test and normal LVEF by echo 60-65% (2017) 2. PVCs and palpitations 3. Abnormal EKG with T-wave inversions 4. History of moderate nonobstructive coronary artery disease by cath in 2002 5. Strong family history of premature coronary disease in his father and brother who died of MIs in their 20s 6. Type 2 diabetes 7. Dyslipidemia 8. Hypertension  PLAN: 1.   Mr. Feltus seems to be coming along without any new chest pain or worsening shortness  of breath.  Is not as physically active as he used to be.  He has some peripheral  neuropathy, improved on B12.  I would suggest addition of ezetimibe for lipid lowering however he feels like he would not like to add additional medication at this time.  He wants to work on diet further but I think it is unlikely that he is going to reach a target LDL less than 70.  Would plan repeat lipids through his primary care provider who he will see in January.  If he is not reach that target, additional therapy is recommended.  He ultimately may need a PCSK9 inhibitor.  Follow-up with me annually or sooner as necessary.  Pixie Casino, MD, [facility], [place] Director of the Advanced Lipid Disorders &  Cardiovascular Risk Reduction Clinic Diplomate of the American Board of Clinical Lipidology Attending Cardiologist  Direct Dial: [phone]  Fax: [phone]  Website:  [email or web address]  Nadean Corwin Ewa Hipp 05/30/2020, 8:22 AM

## 2020-05-30 NOTE — Patient Instructions (Signed)

## 2020-08-21 ENCOUNTER — Other Ambulatory Visit: Payer: Self-pay

## 2020-08-21 ENCOUNTER — Encounter: Payer: Self-pay | Admitting: Internal Medicine

## 2020-08-21 ENCOUNTER — Ambulatory Visit (INDEPENDENT_AMBULATORY_CARE_PROVIDER_SITE_OTHER): Payer: Medicare PPO | Admitting: Internal Medicine

## 2020-08-21 VITALS — BP 161/84 | HR 74 | Ht 72.0 in | Wt 282.0 lb

## 2020-08-21 DIAGNOSIS — R0602 Shortness of breath: Secondary | ICD-10-CM

## 2020-08-21 DIAGNOSIS — R0789 Other chest pain: Secondary | ICD-10-CM | POA: Diagnosis not present

## 2020-08-21 DIAGNOSIS — I2510 Atherosclerotic heart disease of native coronary artery without angina pectoris: Secondary | ICD-10-CM

## 2020-08-21 DIAGNOSIS — R200 Anesthesia of skin: Secondary | ICD-10-CM | POA: Diagnosis not present

## 2020-08-21 DIAGNOSIS — R202 Paresthesia of skin: Secondary | ICD-10-CM

## 2020-08-21 NOTE — Patient Instructions (Signed)
Medication Instructions:  Your physician recommends that you continue on your current medications as directed. Please refer to the Current Medication list given to you today.  *If you need a refill on your cardiac medications before your next appointment, please call your pharmacy*   Testing/Procedures: [facility] Call Stress Test @ [address] Floor   Follow-Up: At [facility], you and your health needs are our priority.  As part of our continuing mission to provide you with exceptional heart care, we have created designated Provider Care Teams.  These Care Teams include your primary Cardiologist (physician) and Advanced Practice Providers (APPs -  Physician Assistants and Nurse Practitioners) who all work together to provide you with the care you need, when you need it.  We recommend signing up for the patient portal called "MyChart".  Sign up information is provided on this After Visit Summary.  MyChart is used to connect with patients for Virtual Visits (Telemedicine).  Patients are able to view lab/test results, encounter notes, upcoming appointments, etc.  Non-urgent messages can be sent to your provider as well.   To learn more about what you can do with MyChart, go to [email or web address].    Your next appointment:   1 month(s)  The format for your next appointment:   In Person  Provider:   You may see Pixie Casino, MD or one of the following Advanced Practice Providers on your designated Care Team:    Almyra Deforest, PA-C  Fabian Sharp, PA-C or   Roby Lofts, [place]    Other Instructions

## 2020-08-21 NOTE — Progress Notes (Signed)
OFFICE NOTE  Chief Complaint:  Left arm tingling, left chest pressure  Primary Care Physician: Sueanne Margarita, DO  HPI:  Oscar [name] is a 74 y.o. male who is a history of moderate obesity, dyslipidemia, hypertension and type 2 diabetes. There is a strong family history of heart disease with his father who died of a massive heart attack at age 86 and a brother who died of an MI at age 55. Recently he's noted some increase in palpitations. He saw his primary care provider who did EKGs and noted he was having PACs. He was started on metoprolol tartrate 25 mg twice a day. He reports marked improvement in his symptoms over his EKG still shows PACs. Heart rate however is lower and blood pressure which was in the 086P and 619 systolic is now 509/32 in the office today. [name] denies any chest pain but notes that he does get short of breath and sweats very easily with minimal exertion. Although his overweight his weight is been elevated for some time and not necessarily increasing to explain the new change in his symptoms. He's felt more fatigued recently as well. Of note he previously seen [facility] cardiology in the early 2000's. He underwent heart catheterization 2002, which showed normal LV systolic function and three-vessel nonobstructive coronary disease. He was noted to have a 30% ostial LAD stenosis, a 50% first diagonal branch stenosis, and apical 70% LAD stenosis. The left circumflex had a 40% stenosis in the mid vessel and diffuse 20% distal stenosis. Right coronary artery was codominant and noted to have 50% diffuse stenosis in the mid vessel and a tubular 60% distal stenosis. Medical therapy was recommended. EKG today shows sinus rhythm with PACs and left anterior fascicular block. There was voltage criteria for LVH and lateral T-wave inversions concerning for ischemia.  07/24/2016  Oscar [name] returns today for follow-up of his studies. He underwent nuclear stress testing and ultimately had an  echocardiogram due to abnormal nuclear findings. Nuclear stress test showed no reversible ischemia and a fixed basal inferior defect concerning for probable artifact. LVEF was decreased at 42% however the nuclear readers suggested that the EF appeared to be normal. Subsequent echocardiography showed normal LV function without regional wall motion abnormalities and mild diastolic dysfunction. I shared those results of Mr. Strout today and feel that they're fairly reassuring. He's not describing chest pain but does get short of breath and sweats easily with minimal exertion. This could be weight related. He does note improvement with metoprolol with regards to his PACs. He says he no longer has any palpitations. He is on optimal medical therapy for diabetes, cholesterol and is on daily aspirin.  01/21/2017  Oscar [name] was seen today in follow-up. He denies any worsening shortness of breath or chest pain. His EKG is unchanged showing persistent T-wave inversions. Echo which showed mild diastolic dysfunction with normal LVEF. I've encouraged him to work on exercise and weight loss however there is not been a lot of that. He recently played golf the other weekend said that he was able to walk around without any difficulty but then had to walk several blocks to go to dinner and became a little short of breath. He reports his blood sugars been well controlled with hemoglobin A1c is 6. Cholesterol is at goal as well. He takes only half tablet of Lasix for some intermittent swelling of his legs mostly related to venous hypertension.  03/02/2018  Oscar [name] returns today for follow-up.  Overall he is done well.  He denies any worsening chest pain or shortness of breath.  He has been playing some golf.  Recently has been having problems with his back.  He is followed by Dr. Sherwood Gambler.  He has had some leg pain which is unclear if it is related to his back or peripheral arterial disease.  He reports cool feet and has answered  yes to some notable lower extremity hair loss.  He has a history of hypertension and dyslipidemia as described.  His EKG actually looks somewhat better today, but does show sinus rhythm with PACs, LVH and nonspecific T wave changes laterally.  Lab work is pending from his PCP, however he said his hemoglobin A1c was 6.3 on metformin.  05/30/2020  Oscar [name] seen today in follow-up.  Is been 2 years since I last saw him.  He has followed up with his PCP back in June.  The time his A1c was 7.9.  Cholesterol was assessed in January.  Total was 172, HDL 37, triglycerides 115 and LDL 112.  His target LDL should probably be less than 70 given diabetes and known CAD by cath in 2002.  He is on pravastatin.  We discussed the possibility of adding ezetimibe or more aggressive dietary invention and weight loss.  It does appear that he is down a few pounds since his visit in June.  He has some difficulty exercising due to back issues.  He recently was started on vitamin B12 with some improvement in what is thought to be peripheral neuropathy as he is described "cool feet" but had essentially normal lower extremity arterial Dopplers in 2019.  08/21/2020  Oscar [name] seen today for follow-up.  He has reported some progressive shortness of breath and fatigue. He occasionally gets tingling in his left arm and left anterior chest wall, sometimes worse with exertion.  Blood pressure was elevated today however at 161/84.  He reports home blood pressure readings are better.  EKG today shows a sinus rhythm with some PACs.  He denies any palpitations or arrhythmias.  He had cholesterol testing in January 2021 showed total cholesterol 172, HDL 37, LDL 112 and triglycerides 115.  Hemoglobin A1c as of June was 7.9.  He is working with his PCP on his blood sugars.  LDL still remains above target.  He has ongoing complaints of cool feet which feel warm but are subjectively cool and has had diagnosis of neuropathy.  He also had lower  extremity arterial Dopplers in the past which were normal.  PMHx:  Past Medical History:  Diagnosis Date   Anesthesia complication    PER PT, "HARD TO WAKE' UP PAST SHOULDER SURGERY IN 2016   Arthritis    lumbar, spondylosis, DDD,stenosis    Atrial premature depolarization    Chronic right hip pain    and right leg pain   Diabetes mellitus without complication ([place])    Dysrhythmia    pt. reports that he has been told in the past that he has  irreg. heattbeat    Edema    Gout    bil ankles   Hyperlipidemia    Hypertension    Rotator cuff tear    right   Shortness of breath    Swelling of extremity    bil feet   Type 2 diabetes mellitus with hyperglycemia [facility])     Past Surgical History:  Procedure Laterality Date   BACK SURGERY     January 2014   CARDIAC  CATHETERIZATION     2004, outcome good, to be followed for/with medical therapy    ELBOW BURSA SURGERY     removed bursacs both elbows   SHOULDER ARTHROSCOPY WITH ROTATOR CUFF REPAIR AND SUBACROMIAL DECOMPRESSION Right 08/21/2015   Procedure: SHOULDER ARTHROSCOPY WITH ROTATOR CUFF REPAIR S UBACROMIAL DECOMPRESSION, ;  Surgeon: Tania Ade, MD;  Location: [facility];  Service: Orthopedics;  Laterality: Right;  Right shoulder arthroscopy rotator cuff repair vs debridement, subacromial decompression, possible biceps tenotomy.    FAMHx:  Family History  Problem Relation Age of Onset   Heart disease Father    Diabetes Father    Heart disease Brother    Diabetes Paternal Grandmother    Colon cancer Neg Hx    Esophageal cancer Neg Hx    Rectal cancer Neg Hx    Stomach cancer Neg Hx     SOCHx:   reports that he quit smoking about 46 years ago. His smoking use included cigarettes. He has a 3.00 pack-year smoking history. He has never used smokeless tobacco. He reports current alcohol use. He reports that he does not use drugs.  ALLERGIES:  Allergies  Allergen Reactions    Yellow Fever Vaccine Hives    ROS: Pertinent items noted in HPI and remainder of comprehensive ROS otherwise negative.  HOME MEDS: Current Outpatient Medications  Medication Sig Dispense Refill   aspirin 81 MG tablet Take 81 mg by mouth daily.     colchicine 0.6 MG tablet Take 0.6 mg by mouth daily with breakfast.      furosemide (LASIX) 40 MG tablet Take 20 mg by mouth. Takes 1/2 tablet daily      hydrocortisone 2.5 % cream APPLY TO GROIN AS DIRECTED DAILY 28.35 g 0   hydrocortisone 2.5 % cream Apply topically 2 (two) times daily. 30 g 4   losartan (COZAAR) 50 MG tablet Take 50 mg by mouth daily before breakfast.      metFORMIN (GLUCOPHAGE) 500 MG tablet Take 500 mg by mouth 2 (two) times daily with a meal.     metoprolol tartrate (LOPRESSOR) 25 MG tablet Take 1 tablet by mouth 2 (two) times daily.     nabumetone (RELAFEN) 500 MG tablet Take 500 mg by mouth 2 (two) times daily.      NITROSTAT 0.4 MG SL tablet Take 1 tablet by mouth as directed.     pravastatin (PRAVACHOL) 40 MG tablet Take 40 mg by mouth daily with breakfast.      traMADol (ULTRAM) 50 MG tablet Take 1 tablet by mouth 2 (two) times daily.      valACYclovir (VALTREX) 500 MG tablet Take 500 mg by mouth daily with supper.      No current facility-administered medications for this visit.    LABS/IMAGING: No results found for this or any previous visit (from the past 48 hour(s)). No results found.  WEIGHTS: Wt Readings from Last 3 Encounters:  08/21/20 282 lb (127.9 kg)  05/30/20 (!) 281 lb (127.5 kg)  09/01/18 291 lb (132 kg)    VITALS: BP (!) 161/84    Pulse 74    Ht 6' (1.829 m)    Wt 282 lb (127.9 kg)    SpO2 100%    BMI 38.25 kg/m   EXAM: General appearance: alert, no distress and morbidly obese Neck: no carotid bruit, no JVD and thyroid not enlarged, symmetric, no tenderness/mass/nodules Lungs: clear to auscultation bilaterally Heart: regular rate and rhythm Abdomen: soft, non-tender; bowel  sounds normal;  no masses,  no organomegaly and obese Extremities: extremities normal, atraumatic, no cyanosis or edema Pulses: decreased pedal pulses Skin: dry, cool, hair loss noted on legs Neurologic: Grossly normal Psych: Pleasant  EKG: Sinus rhythm PACs at 74, left anterior fascicular block, minimal voltage criteria for LVH and lateral T wave inversions which are new-personally reviewed  ASSESSMENT: 1. Left arm/left chest tingling, pressure 2. Low risk Myoview stress test and normal LVEF by echo 60-65% (2017) 3. PVCs and palpitations 4. Abnormal EKG with T-wave inversions 5. History of moderate nonobstructive coronary artery disease by cath in 2002 6. Strong family history of premature coronary disease in his father and brother who died of MIs in their 79s 7. Type 2 diabetes 8. Dyslipidemia 9. Hypertension  PLAN: 1.   Mr. Levesque has been describing some left arm tingling and left chest pressure, sometimes with exertion as well as some dyspnea. He was noted to have moderate non-obstructive CAD by cath in 2002, but had a negative myoview in 2017. I would like to repeat his myoview stress test given these new symptoms which I think are somewhat atypical and may be explained by a radiculopathy, however, some of the exertional dyspnea and chest pressure could be angina. Will follow-up with him after stress testing to review the results.  Pixie Casino, MD, [facility], [place] Director of the Advanced Lipid Disorders &  Cardiovascular Risk Reduction Clinic Diplomate of the American Board of Clinical Lipidology Attending Cardiologist  Direct Dial: [phone]   Fax: [phone]  Website:  [email or web address] 08/21/2020, 3:28 PM

## 2020-08-22 ENCOUNTER — Encounter: Payer: Self-pay | Admitting: Internal Medicine

## 2020-09-12 ENCOUNTER — Telehealth (HOSPITAL_COMMUNITY): Payer: Self-pay | Admitting: *Deleted

## 2020-09-12 NOTE — Telephone Encounter (Signed)
Patient given detailed instructions per Myocardial Perfusion Study Information Sheet for the test on 09/18/20. Patient notified to arrive 15 minutes early and that it is imperative to arrive on time for appointment to keep from having the test rescheduled.  If you need to cancel or reschedule your appointment, please call the office within 24 hours of your appointment. . Patient verbalized understanding. Kirstie Peri

## 2020-09-18 ENCOUNTER — Other Ambulatory Visit: Payer: Self-pay

## 2020-09-18 ENCOUNTER — Ambulatory Visit (HOSPITAL_COMMUNITY): Payer: Medicare PPO | Attending: Cardiology

## 2020-09-18 DIAGNOSIS — I2510 Atherosclerotic heart disease of native coronary artery without angina pectoris: Secondary | ICD-10-CM | POA: Diagnosis not present

## 2020-09-18 DIAGNOSIS — R0602 Shortness of breath: Secondary | ICD-10-CM | POA: Diagnosis present

## 2020-09-19 ENCOUNTER — Ambulatory Visit (HOSPITAL_COMMUNITY): Payer: Medicare PPO | Attending: Cardiology

## 2020-09-19 LAB — MYOCARDIAL PERFUSION IMAGING
LV dias vol: 88 mL (ref 62–150)
LV sys vol: 45 mL
Peak HR: 94 {beats}/min
Rest HR: 75 {beats}/min
SDS: 2
SRS: 0
SSS: 2
TID: 0.9

## 2020-09-19 MED ORDER — TECHNETIUM TC 99M TETROFOSMIN IV KIT
31.4000 | PACK | Freq: Once | INTRAVENOUS | Status: AC | PRN
  Administered 2020-09-18: 31.4 via INTRAVENOUS
  Filled 2020-09-19: qty 32

## 2020-09-19 MED ORDER — TECHNETIUM TC 99M TETROFOSMIN IV KIT
32.7000 | PACK | Freq: Once | INTRAVENOUS | Status: AC | PRN
  Administered 2020-09-19: 32.7 via INTRAVENOUS
  Filled 2020-09-19: qty 33

## 2020-09-19 MED ORDER — REGADENOSON 0.4 MG/5ML IV SOLN
0.4000 mg | Freq: Once | INTRAVENOUS | Status: AC
  Administered 2020-09-18: 0.4 mg via INTRAVENOUS

## 2020-09-19 MED ORDER — TECHNETIUM TC 99M TETROFOSMIN IV KIT
31.6000 | PACK | Freq: Once | INTRAVENOUS | Status: AC | PRN
  Filled 2020-09-19: qty 32

## 2020-10-04 DIAGNOSIS — G5622 Lesion of ulnar nerve, left upper limb: Secondary | ICD-10-CM | POA: Diagnosis not present

## 2020-10-04 DIAGNOSIS — M720 Palmar fascial fibromatosis [Dupuytren]: Secondary | ICD-10-CM | POA: Diagnosis not present

## 2020-10-04 DIAGNOSIS — M4722 Other spondylosis with radiculopathy, cervical region: Secondary | ICD-10-CM | POA: Diagnosis not present

## 2020-10-04 DIAGNOSIS — R200 Anesthesia of skin: Secondary | ICD-10-CM | POA: Diagnosis not present

## 2020-10-09 ENCOUNTER — Other Ambulatory Visit: Payer: Self-pay

## 2020-10-09 ENCOUNTER — Encounter: Payer: Self-pay | Admitting: Internal Medicine

## 2020-10-09 ENCOUNTER — Ambulatory Visit: Payer: Medicare PPO | Admitting: Internal Medicine

## 2020-10-09 VITALS — BP 164/96 | HR 64 | Ht 72.0 in | Wt 282.2 lb

## 2020-10-09 DIAGNOSIS — R200 Anesthesia of skin: Secondary | ICD-10-CM | POA: Diagnosis not present

## 2020-10-09 DIAGNOSIS — E782 Mixed hyperlipidemia: Secondary | ICD-10-CM | POA: Diagnosis not present

## 2020-10-09 DIAGNOSIS — I2510 Atherosclerotic heart disease of native coronary artery without angina pectoris: Secondary | ICD-10-CM | POA: Diagnosis not present

## 2020-10-09 DIAGNOSIS — R202 Paresthesia of skin: Secondary | ICD-10-CM | POA: Diagnosis not present

## 2020-10-09 NOTE — Progress Notes (Signed)
OFFICE NOTE  Chief Complaint:  Follow-up left arm tingling, left chest pressure  Primary Care Physician: Sueanne Margarita, DO  HPI:  Oscar [name] is a 74 y.o. male who is a history of moderate obesity, dyslipidemia, hypertension and type 2 diabetes. There is a strong family history of heart disease with his father who died of a massive heart attack at age 25 and a brother who died of an MI at age 71. Recently he's noted some increase in palpitations. He saw his primary care provider who did EKGs and noted he was having PACs. He was started on metoprolol tartrate 25 mg twice a day. He reports marked improvement in his symptoms over his EKG still shows PACs. Heart rate however is lower and blood pressure which was in the 921J and 941 systolic is now 740/81 in the office today. Mr. Calabrese denies any chest pain but notes that he does get short of breath and sweats very easily with minimal exertion. Although his overweight his weight is been elevated for some time and not necessarily increasing to explain the new change in his symptoms. He's felt more fatigued recently as well. Of note he previously seen [facility] in the early 2000's. He underwent heart catheterization 2002, which showed normal LV systolic function and three-vessel nonobstructive coronary disease. He was noted to have a 30% ostial LAD stenosis, a 50% first diagonal branch stenosis, and apical 70% LAD stenosis. The left circumflex had a 40% stenosis in the mid vessel and diffuse 20% distal stenosis. Right coronary artery was codominant and noted to have 50% diffuse stenosis in the mid vessel and a tubular 60% distal stenosis. Medical therapy was recommended. EKG today shows sinus rhythm with PACs and left anterior fascicular block. There was voltage criteria for LVH and lateral T-wave inversions concerning for ischemia.  07/24/2016  Oscar [name] returns today for follow-up of his studies. He underwent nuclear stress testing and  ultimately had an echocardiogram due to abnormal nuclear findings. Nuclear stress test showed no reversible ischemia and a fixed basal inferior defect concerning for probable artifact. LVEF was decreased at 42% however the nuclear readers suggested that the EF appeared to be normal. Subsequent echocardiography showed normal LV function without regional wall motion abnormalities and mild diastolic dysfunction. I shared those results of Oscar [name] today and feel that they're fairly reassuring. He's not describing chest pain but does get short of breath and sweats easily with minimal exertion. This could be weight related. He does note improvement with metoprolol with regards to his PACs. He says he no longer has any palpitations. He is on optimal medical therapy for diabetes, cholesterol and is on daily aspirin.  01/21/2017  Oscar [name] was seen today in follow-up. He denies any worsening shortness of breath or chest pain. His EKG is unchanged showing persistent T-wave inversions. Echo which showed mild diastolic dysfunction with normal LVEF. I've encouraged him to work on exercise and weight loss however there is not been a lot of that. He recently played golf the other weekend said that he was able to walk around without any difficulty but then had to walk several blocks to go to dinner and became a little short of breath. He reports his blood sugars been well controlled with hemoglobin A1c is 6. Cholesterol is at goal as well. He takes only half tablet of Lasix for some intermittent swelling of his legs mostly related to venous hypertension.  03/02/2018  Oscar [name] returns today for follow-up.  Overall he is done well.  He denies any worsening chest pain or shortness of breath.  He has been playing some golf.  Recently has been having problems with his back.  He is followed by Dr. Sherwood Gambler.  He has had some leg pain which is unclear if it is related to his back or peripheral arterial disease.  He reports cool feet  and has answered yes to some notable lower extremity hair loss.  He has a history of hypertension and dyslipidemia as described.  His EKG actually looks somewhat better today, but does show sinus rhythm with PACs, LVH and nonspecific T wave changes laterally.  Lab work is pending from his PCP, however he said his hemoglobin A1c was 6.3 on metformin.  05/30/2020  Oscar [name] seen today in follow-up.  Is been 2 years since I last saw him.  He has followed up with his PCP back in June.  The time his A1c was 7.9.  Cholesterol was assessed in January.  Total was 172, HDL 37, triglycerides 115 and LDL 112.  His target LDL should probably be less than 70 given diabetes and known CAD by cath in 2002.  He is on pravastatin.  We discussed the possibility of adding ezetimibe or more aggressive dietary invention and weight loss.  It does appear that he is down a few pounds since his visit in June.  He has some difficulty exercising due to back issues.  He recently was started on vitamin B12 with some improvement in what is thought to be peripheral neuropathy as he is described "cool feet" but had essentially normal lower extremity arterial Dopplers in 2019.  08/21/2020  Oscar [name] seen today for follow-up.  He has reported some progressive shortness of breath and fatigue. He occasionally gets tingling in his left arm and left anterior chest wall, sometimes worse with exertion.  Blood pressure was elevated today however at 161/84.  He reports home blood pressure readings are better.  EKG today shows a sinus rhythm with some PACs.  He denies any palpitations or arrhythmias.  He had cholesterol testing in January 2021 showed total cholesterol 172, HDL 37, LDL 112 and triglycerides 115.  Hemoglobin A1c as of June was 7.9.  He is working with his PCP on his blood sugars.  LDL still remains above target.  He has ongoing complaints of cool feet which feel warm but are subjectively cool and has had diagnosis of neuropathy.  He also  had lower extremity arterial Dopplers in the past which were normal.  10/09/2020  Oscar [name] returns today for follow-up.  He underwent stress testing on November 16.  There was a small inferior attenuation artifact which improved with upright imaging.  This is more consistent with bowel shadowing.  No ischemia was noted.  EF was at 49% however an echo in the past that showed a low normal EF around 50%.  He really denies any shortness of breath.  He had been complaining of left arm symptoms really concerning for a radiculopathy.  He saw Dr.Kuzma who did nerve conduction testing and from what he explained to me it sounds like he may have left ulnar nerve entrapment at the elbow and may need surgery.  From a cardiac standpoint he is at acceptable risk for this.  PMHx:  Past Medical History:  Diagnosis Date  . Anesthesia complication    PER PT, "HARD TO WAKE' UP PAST SHOULDER SURGERY IN 2016  . Arthritis    lumbar, spondylosis, DDD,stenosis   .  Atrial premature depolarization   . Chronic right hip pain    and right leg pain  . Diabetes mellitus without complication ([place])   . Dysrhythmia    pt. reports that he has been told in the past that he has  irreg. heattbeat   . Edema   . Gout    bil ankles  . Hyperlipidemia   . Hypertension   . Rotator cuff tear    right  . Shortness of breath   . Swelling of extremity    bil feet  . Type 2 diabetes mellitus with hyperglycemia [facility])     Past Surgical History:  Procedure Laterality Date  . BACK SURGERY     January 2014  . CARDIAC CATHETERIZATION     2004, outcome good, to be followed for/with medical therapy   . ELBOW BURSA SURGERY     removed bursacs both elbows  . SHOULDER ARTHROSCOPY WITH ROTATOR CUFF REPAIR AND SUBACROMIAL DECOMPRESSION Right 08/21/2015   Procedure: SHOULDER ARTHROSCOPY WITH ROTATOR CUFF REPAIR S UBACROMIAL DECOMPRESSION, ;  Surgeon: Tania Ade, MD;  Location: [place];  Service: Orthopedics;   Laterality: Right;  Right shoulder arthroscopy rotator cuff repair vs debridement, subacromial decompression, possible biceps tenotomy.    FAMHx:  Family History  Problem Relation Age of Onset  . Heart disease Father   . Diabetes Father   . Heart disease Brother   . Diabetes Paternal Grandmother   . Colon cancer Neg Hx   . Esophageal cancer Neg Hx   . Rectal cancer Neg Hx   . Stomach cancer Neg Hx     SOCHx:   reports that he quit smoking about 46 years ago. His smoking use included cigarettes. He has a 3.00 pack-year smoking history. He has never used smokeless tobacco. He reports current alcohol use. He reports that he does not use drugs.  ALLERGIES:  Allergies  Allergen Reactions  . Yellow Fever Vaccine Hives    ROS: Pertinent items noted in HPI and remainder of comprehensive ROS otherwise negative.  HOME MEDS: Current Outpatient Medications  Medication Sig Dispense Refill  . aspirin 81 MG tablet Take 81 mg by mouth daily.    . colchicine 0.6 MG tablet Take 0.6 mg by mouth daily with breakfast.     . furosemide (LASIX) 40 MG tablet Take 20 mg by mouth. Takes 1/2 tablet daily     . hydrocortisone 2.5 % cream APPLY TO GROIN AS DIRECTED DAILY 28.35 g 0  . hydrocortisone 2.5 % cream Apply topically 2 (two) times daily. 30 g 4  . losartan (COZAAR) 50 MG tablet Take 50 mg by mouth daily before breakfast.     . metFORMIN (GLUCOPHAGE) 500 MG tablet Take 500 mg by mouth 2 (two) times daily with a meal.    . metoprolol tartrate (LOPRESSOR) 25 MG tablet Take 1 tablet by mouth 2 (two) times daily.    . nabumetone (RELAFEN) 500 MG tablet Take 500 mg by mouth 2 (two) times daily.     Marland Kitchen NITROSTAT 0.4 MG SL tablet Take 1 tablet by mouth as directed.    . pravastatin (PRAVACHOL) 40 MG tablet Take 40 mg by mouth daily with breakfast.     . traMADol (ULTRAM) 50 MG tablet Take 1 tablet by mouth 2 (two) times daily.     . valACYclovir (VALTREX) 500 MG tablet Take 500 mg by mouth daily with  supper.      No current facility-administered medications for this visit.  Facility-Administered Medications Ordered in Other Visits  Medication Dose Route Frequency Provider Last Rate Last Admin  . technetium tetrofosmin (TC-MYOVIEW) injection 68.3 millicurie  72.9 millicurie Intravenous Once PRN Lelon Perla, MD        LABS/IMAGING: No results found for this or any previous visit (from the past 48 hour(s)). No results found.  WEIGHTS: Wt Readings from Last 3 Encounters:  10/09/20 282 lb 3.2 oz (128 kg)  09/18/20 282 lb (127.9 kg)  08/21/20 282 lb (127.9 kg)    VITALS: BP (!) 164/96   Pulse 64   Ht 6' (1.829 m)   Wt 282 lb 3.2 oz (128 kg)   BMI 38.27 kg/m   EXAM: Deferred  EKG: Deferred  ASSESSMENT: 1. Left arm/left chest tingling, pressure -low risk Myoview stress test (09/2020) 2. Low risk Myoview stress test and normal LVEF by echo 60-65% (2017) 3. PVCs and palpitations 4. Abnormal EKG with T-wave inversions 5. History of moderate nonobstructive coronary artery disease by cath in 2002 6. Strong family history of premature coronary disease in his father and brother who died of MIs in their 55s 7. Type 2 diabetes 8. Dyslipidemia 9. Hypertension  PLAN: 1.   Mr. Dible had a low risk Myoview stress test.  He was also found to have what sounds like an ulnar nerve entrapment in the left arm.  He may end up needing surgery for this.  From a cardiac standpoint he is at acceptable risk for that procedure.  It would be okay to hold antiplatelet agents as necessary and restart afterwards.  Plan follow-up with me annually or sooner as necessary.  Pixie Casino, MD, [facility], [place] Director of the Advanced Lipid Disorders &  Cardiovascular Risk Reduction Clinic Diplomate of the American Board of Clinical Lipidology Attending Cardiologist  Direct Dial: [phone]  Fax: [phone]  Website:  [email or web address]  Yahaira Bruski 10/09/2020, 11:42 AM

## 2020-10-09 NOTE — Patient Instructions (Signed)

## 2020-11-13 ENCOUNTER — Telehealth: Payer: Self-pay | Admitting: *Deleted

## 2020-11-13 DIAGNOSIS — G5622 Lesion of ulnar nerve, left upper limb: Secondary | ICD-10-CM | POA: Diagnosis not present

## 2020-11-13 DIAGNOSIS — M720 Palmar fascial fibromatosis [Dupuytren]: Secondary | ICD-10-CM | POA: Diagnosis not present

## 2020-11-13 NOTE — Telephone Encounter (Signed)
   [facility] Pre-operative Risk Assessment    HEARTCARE STAFF: - Please ensure there is not already an duplicate clearance open for this procedure. - Under Visit Info/Reason for Call, type in Other and utilize the format Clearance MM/DD/YY or Clearance TBD. Do not use dashes or single digits. - If request is for dental extraction, please clarify the # of teeth to be extracted.  Request for surgical clearance:  1. What type of surgery is being performed? Fasciectomy LSF   2. When is this surgery scheduled? 12/12/20   3. What type of clearance is required (medical clearance vs. Pharmacy clearance to hold med vs. Both)? both  4. Are there any medications that need to be held prior to surgery and how long?aspirin-need direction   5. Practice name and name of physician performing surgery? Dr Daryll Brod   6. What is the office phone number? [phone]   8.   What is the office fax number? [phone]  8.   Anesthesia type (None, local, MAC, general) ? Axillary block   Fredia Beets 11/13/2020, 1:39 PM  _________________________________________________________________   (provider comments below)

## 2020-11-14 ENCOUNTER — Other Ambulatory Visit: Payer: Self-pay | Admitting: Orthopedic Surgery

## 2020-11-14 ENCOUNTER — Telehealth: Payer: Self-pay | Admitting: Internal Medicine

## 2020-11-14 NOTE — Telephone Encounter (Signed)
Follow up:     Hassan Rowan from the [facility] calling and need Dr. Debara Pickett to decide if patient should be taken Asprin or anything else. Please fax information to [phone]

## 2020-11-14 NOTE — Telephone Encounter (Signed)
Patient cleared in separate clearance note from yesterday

## 2020-11-14 NOTE — Telephone Encounter (Signed)
   Primary Cardiologist: Pixie Casino, MD  Chart reviewed as part of pre-operative protocol coverage. Given past medical history and time since last visit, based on ACC/AHA guidelines, [name] would be at acceptable risk for the planned procedure without further cardiovascular testing. Per Dr. Lysbeth Penner note on 10/09/2020, "From a cardiac standpoint he is at acceptable risk for that procedure.  It would be okay to hold antiplatelet agents as necessary and restart afterwards." Ok to hold aspirin for 5-7 days prior to the surgery and restart as soon as possible afterward at the surgeon's discretion.  The patient was advised that if he develops new symptoms prior to surgery to contact our office to arrange for a follow-up visit, and he verbalized understanding.  I will route this recommendation to the requesting party via Epic fax function and remove from pre-op pool.  Please call with questions.  [place], [place] 11/14/2020, 8:47 AM

## 2020-11-24 DIAGNOSIS — E1169 Type 2 diabetes mellitus with other specified complication: Secondary | ICD-10-CM | POA: Diagnosis not present

## 2020-11-24 DIAGNOSIS — E785 Hyperlipidemia, unspecified: Secondary | ICD-10-CM | POA: Diagnosis not present

## 2020-11-24 DIAGNOSIS — M109 Gout, unspecified: Secondary | ICD-10-CM | POA: Diagnosis not present

## 2020-11-24 DIAGNOSIS — Z125 Encounter for screening for malignant neoplasm of prostate: Secondary | ICD-10-CM | POA: Diagnosis not present

## 2020-11-27 ENCOUNTER — Other Ambulatory Visit: Payer: Self-pay | Admitting: Dermatology

## 2020-11-30 ENCOUNTER — Other Ambulatory Visit: Payer: Self-pay | Admitting: Dermatology

## 2020-12-01 DIAGNOSIS — E785 Hyperlipidemia, unspecified: Secondary | ICD-10-CM | POA: Diagnosis not present

## 2020-12-01 DIAGNOSIS — Z1212 Encounter for screening for malignant neoplasm of rectum: Secondary | ICD-10-CM | POA: Diagnosis not present

## 2020-12-01 DIAGNOSIS — G5622 Lesion of ulnar nerve, left upper limb: Secondary | ICD-10-CM | POA: Diagnosis not present

## 2020-12-01 DIAGNOSIS — D239 Other benign neoplasm of skin, unspecified: Secondary | ICD-10-CM | POA: Diagnosis not present

## 2020-12-01 DIAGNOSIS — M720 Palmar fascial fibromatosis [Dupuytren]: Secondary | ICD-10-CM | POA: Diagnosis not present

## 2020-12-01 DIAGNOSIS — E538 Deficiency of other specified B group vitamins: Secondary | ICD-10-CM | POA: Diagnosis not present

## 2020-12-01 DIAGNOSIS — E1169 Type 2 diabetes mellitus with other specified complication: Secondary | ICD-10-CM | POA: Diagnosis not present

## 2020-12-01 DIAGNOSIS — R82998 Other abnormal findings in urine: Secondary | ICD-10-CM | POA: Diagnosis not present

## 2020-12-01 DIAGNOSIS — M109 Gout, unspecified: Secondary | ICD-10-CM | POA: Diagnosis not present

## 2020-12-01 DIAGNOSIS — Z0000 Encounter for general adult medical examination without abnormal findings: Secondary | ICD-10-CM | POA: Diagnosis not present

## 2020-12-05 ENCOUNTER — Encounter (HOSPITAL_BASED_OUTPATIENT_CLINIC_OR_DEPARTMENT_OTHER): Payer: Self-pay | Admitting: Orthopedic Surgery

## 2020-12-07 ENCOUNTER — Other Ambulatory Visit (HOSPITAL_COMMUNITY): Payer: Medicare PPO

## 2020-12-08 ENCOUNTER — Inpatient Hospital Stay (HOSPITAL_BASED_OUTPATIENT_CLINIC_OR_DEPARTMENT_OTHER)
Admission: RE | Admit: 2020-12-08 | Discharge: 2020-12-08 | Disposition: A | Discharge status: Home / Self Care | Payer: Medicare PPO | Source: Ambulatory Visit

## 2020-12-08 ENCOUNTER — Other Ambulatory Visit (HOSPITAL_COMMUNITY)
Admission: RE | Admit: 2020-12-08 | Discharge: 2020-12-08 | Disposition: A | Discharge status: Home / Self Care | Payer: Medicare PPO | Source: Ambulatory Visit | Attending: Orthopedic Surgery | Admitting: Orthopedic Surgery

## 2020-12-08 DIAGNOSIS — Z20822 Contact with and (suspected) exposure to covid-19: Secondary | ICD-10-CM | POA: Diagnosis not present

## 2020-12-08 DIAGNOSIS — Z01812 Encounter for preprocedural laboratory examination: Secondary | ICD-10-CM | POA: Insufficient documentation

## 2020-12-08 LAB — BASIC METABOLIC PANEL
Anion gap: 12 (ref 5–15)
BUN: 12 mg/dL (ref 8–23)
CO2: 24 mmol/L (ref 22–32)
Calcium: 9.1 mg/dL (ref 8.9–10.3)
Chloride: 104 mmol/L (ref 98–111)
Creatinine, Ser: 1.13 mg/dL (ref 0.61–1.24)
GFR, Estimated: >60 mL/min (ref >60)
Glucose, Bld: 178 mg/dL — ABNORMAL HIGH (ref 70–99)
Potassium: 4.1 mmol/L (ref 3.5–5.1)
Sodium: 140 mmol/L (ref 135–145)

## 2020-12-08 LAB — SARS CORONAVIRUS 2 (TAT 6-24 HRS): SARS Coronavirus 2: NEGATIVE

## 2020-12-08 NOTE — Progress Notes (Signed)

## 2020-12-12 ENCOUNTER — Encounter (HOSPITAL_BASED_OUTPATIENT_CLINIC_OR_DEPARTMENT_OTHER)
Admission: RE | Disposition: A | Discharge status: Home / Self Care | Payer: Self-pay | Source: Home / Self Care | Attending: Orthopedic Surgery

## 2020-12-12 ENCOUNTER — Encounter (HOSPITAL_BASED_OUTPATIENT_CLINIC_OR_DEPARTMENT_OTHER): Payer: Self-pay | Admitting: Orthopedic Surgery

## 2020-12-12 ENCOUNTER — Ambulatory Visit (HOSPITAL_BASED_OUTPATIENT_CLINIC_OR_DEPARTMENT_OTHER): Payer: Medicare PPO | Admitting: Anesthesiology

## 2020-12-12 ENCOUNTER — Other Ambulatory Visit: Payer: Self-pay

## 2020-12-12 ENCOUNTER — Ambulatory Visit (HOSPITAL_BASED_OUTPATIENT_CLINIC_OR_DEPARTMENT_OTHER)
Admission: RE | Admit: 2020-12-12 | Discharge: 2020-12-12 | Disposition: A | Discharge status: Home / Self Care | Payer: Medicare PPO | Attending: Orthopedic Surgery | Admitting: Orthopedic Surgery

## 2020-12-12 DIAGNOSIS — Z8249 Family history of ischemic heart disease and other diseases of the circulatory system: Secondary | ICD-10-CM | POA: Diagnosis not present

## 2020-12-12 DIAGNOSIS — Z833 Family history of diabetes mellitus: Secondary | ICD-10-CM | POA: Diagnosis not present

## 2020-12-12 DIAGNOSIS — M720 Palmar fascial fibromatosis [Dupuytren]: Secondary | ICD-10-CM | POA: Diagnosis not present

## 2020-12-12 DIAGNOSIS — Z87891 Personal history of nicotine dependence: Secondary | ICD-10-CM | POA: Insufficient documentation

## 2020-12-12 DIAGNOSIS — I2510 Atherosclerotic heart disease of native coronary artery without angina pectoris: Secondary | ICD-10-CM | POA: Diagnosis not present

## 2020-12-12 DIAGNOSIS — E119 Type 2 diabetes mellitus without complications: Secondary | ICD-10-CM | POA: Diagnosis not present

## 2020-12-12 DIAGNOSIS — I10 Essential (primary) hypertension: Secondary | ICD-10-CM | POA: Diagnosis not present

## 2020-12-12 DIAGNOSIS — Z887 Allergy status to serum and vaccine status: Secondary | ICD-10-CM | POA: Diagnosis not present

## 2020-12-12 DIAGNOSIS — E785 Hyperlipidemia, unspecified: Secondary | ICD-10-CM | POA: Diagnosis not present

## 2020-12-12 HISTORY — PX: FASCIECTOMY: SHX6525

## 2020-12-12 HISTORY — DX: Atherosclerotic heart disease of native coronary artery without angina pectoris: I25.10

## 2020-12-12 LAB — GLUCOSE, CAPILLARY
Glucose-Capillary: 236 mg/dL — ABNORMAL HIGH (ref 70–99)
Glucose-Capillary: 189 mg/dL — ABNORMAL HIGH (ref 70–99)

## 2020-12-12 SURGERY — FASCIECTOMY, PALM
Anesthesia: Regional | Site: Hand | Laterality: Left

## 2020-12-12 MED ORDER — CEFAZOLIN SODIUM-DEXTROSE 2-4 GM/100ML-% IV SOLN
2.0000 g | INTRAVENOUS | Status: AC
  Administered 2020-12-12: 3 g via INTRAVENOUS

## 2020-12-12 MED ORDER — MIDAZOLAM HCL 2 MG/2ML IJ SOLN
INTRAMUSCULAR | Status: AC
  Filled 2020-12-12: qty 2

## 2020-12-12 MED ORDER — PHENYLEPHRINE HCL (PRESSORS) 10 MG/ML IV SOLN
INTRAVENOUS | Status: DC | PRN
  Administered 2020-12-12: 80 ug via INTRAVENOUS
  Administered 2020-12-12: 40 ug via INTRAVENOUS

## 2020-12-12 MED ORDER — MIDAZOLAM HCL 2 MG/2ML IJ SOLN
1.0000 mg | Freq: Once | INTRAMUSCULAR | Status: AC
  Administered 2020-12-12: 1 mg via INTRAVENOUS

## 2020-12-12 MED ORDER — OXYCODONE HCL 5 MG/5ML PO SOLN
5.0000 mg | Freq: Once | ORAL | Status: DC | PRN
Start: 2020-12-12 — End: 2020-12-12

## 2020-12-12 MED ORDER — FENTANYL CITRATE (PF) 100 MCG/2ML IJ SOLN
50.0000 ug | Freq: Once | INTRAMUSCULAR | Status: AC
  Administered 2020-12-12: 50 ug via INTRAVENOUS

## 2020-12-12 MED ORDER — FENTANYL CITRATE (PF) 100 MCG/2ML IJ SOLN: 25.0000 ug | INTRAMUSCULAR | Status: DC | PRN

## 2020-12-12 MED ORDER — LIDOCAINE HCL (PF) 1 % IJ SOLN
INTRAMUSCULAR | Status: AC
  Filled 2020-12-12: qty 30

## 2020-12-12 MED ORDER — THROMBIN 5000 UNITS EX SOLR
CUTANEOUS | Status: AC
  Filled 2020-12-12: qty 5000

## 2020-12-12 MED ORDER — PROPOFOL 500 MG/50ML IV EMUL
INTRAVENOUS | Status: DC | PRN
  Administered 2020-12-12: 75 ug/kg/min via INTRAVENOUS

## 2020-12-12 MED ORDER — FENTANYL CITRATE (PF) 100 MCG/2ML IJ SOLN
INTRAMUSCULAR | Status: AC
  Filled 2020-12-12: qty 2

## 2020-12-12 MED ORDER — ONDANSETRON HCL 4 MG/2ML IJ SOLN: 4.0000 mg | Freq: Once | INTRAMUSCULAR | Status: DC | PRN

## 2020-12-12 MED ORDER — PHENYLEPHRINE 40 MCG/ML (10ML) SYRINGE FOR IV PUSH (FOR BLOOD PRESSURE SUPPORT)
PREFILLED_SYRINGE | INTRAVENOUS | Status: AC
  Filled 2020-12-12: qty 10

## 2020-12-12 MED ORDER — ONDANSETRON HCL 4 MG/2ML IJ SOLN
INTRAMUSCULAR | Status: DC | PRN
  Administered 2020-12-12: 4 mg via INTRAVENOUS

## 2020-12-12 MED ORDER — CEFAZOLIN SODIUM-DEXTROSE 2-4 GM/100ML-% IV SOLN
INTRAVENOUS | Status: AC
  Filled 2020-12-12: qty 100

## 2020-12-12 MED ORDER — THROMBIN 5000 UNITS EX SOLR
CUTANEOUS | Status: DC | PRN
  Administered 2020-12-12: 5000 [IU] via TOPICAL

## 2020-12-12 MED ORDER — CEFAZOLIN SODIUM 1 G IJ SOLR
INTRAMUSCULAR | Status: AC
  Filled 2020-12-12: qty 10

## 2020-12-12 MED ORDER — BUPIVACAINE HCL (PF) 0.25 % IJ SOLN
INTRAMUSCULAR | Status: AC
  Filled 2020-12-12: qty 30

## 2020-12-12 MED ORDER — OXYCODONE HCL 5 MG PO TABS
5.0000 mg | ORAL_TABLET | Freq: Once | ORAL | Status: DC | PRN
Start: 2020-12-12 — End: 2020-12-12

## 2020-12-12 MED ORDER — ROPIVACAINE HCL 5 MG/ML IJ SOLN
INTRAMUSCULAR | Status: DC | PRN
  Administered 2020-12-12: 30 mL via PERINEURAL

## 2020-12-12 MED ORDER — 0.9 % SODIUM CHLORIDE (POUR BTL) OPTIME
TOPICAL | Status: DC | PRN
  Administered 2020-12-12: 120 mL

## 2020-12-12 MED ORDER — LACTATED RINGERS IV SOLN: INTRAVENOUS | Status: DC

## 2020-12-12 SURGICAL SUPPLY — 51 items
APL PRP STRL LF DISP 70% ISPRP (MISCELLANEOUS) ×1
BLADE MINI RND TIP GREEN BEAV (BLADE) ×3 IMPLANT
BLADE SURG 15 STRL LF DISP TIS (BLADE) ×1 IMPLANT
BLADE SURG 15 STRL SS (BLADE) ×2
BNDG CMPR 9X4 STRL LF SNTH (GAUZE/BANDAGES/DRESSINGS) ×1
BNDG COHESIVE 3X5 TAN STRL LF (GAUZE/BANDAGES/DRESSINGS) ×3 IMPLANT
BNDG ESMARK 4X9 LF (GAUZE/BANDAGES/DRESSINGS) ×2 IMPLANT
BNDG GAUZE ELAST 4 BULKY (GAUZE/BANDAGES/DRESSINGS) ×2 IMPLANT
CHLORAPREP W/TINT 26 (MISCELLANEOUS) ×2 IMPLANT
CORD BIPOLAR FORCEPS 12FT (ELECTRODE) ×2 IMPLANT
COVER BACK TABLE 60X90IN (DRAPES) ×2 IMPLANT
COVER MAYO STAND STRL (DRAPES) ×2 IMPLANT
COVER WAND RF STERILE (DRAPES) IMPLANT
CUFF TOURN SGL QUICK 18X4 (TOURNIQUET CUFF) ×1 IMPLANT
DECANTER SPIKE VIAL GLASS SM (MISCELLANEOUS) IMPLANT
DRAPE EXTREMITY T 121X128X90 (DISPOSABLE) ×2 IMPLANT
DRAPE SURG 17X23 STRL (DRAPES) ×2 IMPLANT
GAUZE SPONGE 4X4 12PLY STRL (GAUZE/BANDAGES/DRESSINGS) ×2 IMPLANT
GAUZE XEROFORM 1X8 LF (GAUZE/BANDAGES/DRESSINGS) ×2 IMPLANT
GLOVE SURG LTX SZ6.5 (GLOVE) ×1 IMPLANT
GLOVE SURG LX 7.5 STRW (GLOVE) ×2
GLOVE SURG LX STRL 7.5 STRW (GLOVE) ×2 IMPLANT
GLOVE SURG ORTHO LTX SZ8 (GLOVE) ×2 IMPLANT
GLOVE SURG SS PI 7.0 STRL IVOR (GLOVE) ×1 IMPLANT
GLOVE SURG UNDER POLY LF SZ6.5 (GLOVE) ×2 IMPLANT
GLOVE SURG UNDER POLY LF SZ7 (GLOVE) ×2 IMPLANT
GLOVE SURG UNDER POLY LF SZ8.5 (GLOVE) ×2 IMPLANT
GOWN STRL REUS W/ TWL LRG LVL3 (GOWN DISPOSABLE) ×1 IMPLANT
GOWN STRL REUS W/TWL LRG LVL3 (GOWN DISPOSABLE) ×4
GOWN STRL REUS W/TWL XL LVL3 (GOWN DISPOSABLE) ×4 IMPLANT
LOOP VESSEL MAXI BLUE (MISCELLANEOUS) ×2 IMPLANT
NDL PRECISIONGLIDE 27X1.5 (NEEDLE) ×1 IMPLANT
NEEDLE PRECISIONGLIDE 27X1.5 (NEEDLE) IMPLANT
NS IRRIG 1000ML POUR BTL (IV SOLUTION) ×2 IMPLANT
PACK BASIN DAY SURGERY FS (CUSTOM PROCEDURE TRAY) ×2 IMPLANT
PAD CAST 3X4 CTTN HI CHSV (CAST SUPPLIES) ×1 IMPLANT
PADDING CAST COTTON 3X4 STRL (CAST SUPPLIES) ×2
SLEEVE SCD COMPRESS KNEE MED (MISCELLANEOUS) ×2 IMPLANT
SLING ARM FOAM STRAP XLG (SOFTGOODS) ×1 IMPLANT
SPLINT PLASTER CAST XFAST 3X15 (CAST SUPPLIES) ×10 IMPLANT
SPLINT PLASTER XTRA FASTSET 3X (CAST SUPPLIES) ×10
STOCKINETTE 4X48 STRL (DRAPES) ×2 IMPLANT
SUT ETHILON 4 0 PS 2 18 (SUTURE) ×4 IMPLANT
SUT SILK 2 0 PERMA HAND 18 BK (SUTURE) ×1 IMPLANT
SUT VIC AB 2-0 SH 27 (SUTURE)
SUT VIC AB 2-0 SH 27XBRD (SUTURE) IMPLANT
SUT VICRYL 4-0 PS2 18IN ABS (SUTURE) IMPLANT
SYR BULB EAR ULCER 3OZ GRN STR (SYRINGE) ×2 IMPLANT
SYR CONTROL 10ML LL (SYRINGE) ×1 IMPLANT
TOWEL GREEN STERILE FF (TOWEL DISPOSABLE) ×4 IMPLANT
UNDERPAD 30X36 HEAVY ABSORB (UNDERPADS AND DIAPERS) ×2 IMPLANT

## 2020-12-12 NOTE — Brief Op Note (Signed)
12/12/2020  10:23 AM  PATIENT:  Oscar [name]  75 y.o. male  PRE-OPERATIVE DIAGNOSIS:  DUPUYTREN'S CONTRACTURE LEFT SMALL FINGER  POST-OPERATIVE DIAGNOSIS:  DUPUYTREN'S CONTRACTURE LEFT SMALL FINGER  PROCEDURE:  Procedure(s): FASCIECTOMY LEFT SMALL FINGER (Left)  SURGEON:  Surgeon(s) and Role:    Daryll Brod, MD - Primary  PHYSICIAN ASSISTANT:   ASSISTANTS: R Dasnoit,PAC   ANESTHESIA:   regional and IV sedation  EBL:  5 mL   BLOOD ADMINISTERED:none  DRAINS: Vessel loop palm   LOCAL MEDICATIONS USED:  NONE  SPECIMEN:  Excision  DISPOSITION OF SPECIMEN:  PATHOLOGY  COUNTS:  YES  TOURNIQUET:   Total Tourniquet Time Documented: Upper Arm (Left) - 73 minutes Total: Upper Arm (Left) - 73 minutes   DICTATION: .Viviann Spare Dictation  PLAN OF CARE: Discharge to home after PACU  PATIENT DISPOSITION:  PACU - hemodynamically stable.

## 2020-12-12 NOTE — Discharge Instructions (Addendum)

## 2020-12-12 NOTE — Anesthesia Postprocedure Evaluation (Signed)
Anesthesia Post Note  Patient: Oscar [name]  Procedure(s) Performed: FASCIECTOMY LEFT SMALL FINGER (Left Hand)     Patient location during evaluation: PACU Anesthesia Type: Regional and MAC Level of consciousness: awake and alert Pain management: pain level controlled Vital Signs Assessment: post-procedure vital signs reviewed and stable Respiratory status: spontaneous breathing, nonlabored ventilation, respiratory function stable and patient connected to nasal cannula oxygen Cardiovascular status: stable and blood pressure returned to baseline Postop Assessment: no apparent nausea or vomiting Anesthetic complications: no   No complications documented.  Last Vitals:  Vitals:   12/12/20 1045 12/12/20 1109  BP: (!) 133/101 (!) 139/92  Pulse: 73 93  Resp: 18 17  Temp:  36.5 C  SpO2: 96% 98%    Last Pain:  Vitals:   12/12/20 1109  TempSrc:   PainSc: 0-No pain                 Merlinda Frederick

## 2020-12-12 NOTE — Op Note (Addendum)
NAME: Oscar [name] MEDICAL RECORD NO: [id number] DATE OF BIRTH: August 02, 1946 FAC[facility] LOCATION: [facility] PHYSICIAN: Wynonia Sours, MD   OPERATIVE REPORT   DATE OF PROCEDURE: 12/12/20     PREOPERATIVE DIAGNOSIS:   Dupuytren's contracture left small finger   POSTOPERATIVE DIAGNOSIS:   Seen   PROCEDURE:   Palmar fasciectomy with Z-plasty left small finger   SURGEON: Daryll Brod, M.D.   ASSISTANT: Leverne Humbles, [facility]   ANESTHESIA:  Regional with sedation   INTRAVENOUS FLUIDS:  Per anesthesia flow sheet.   ESTIMATED BLOOD LOSS:  Minimal.   COMPLICATIONS:  None.   SPECIMENS:   Palmar fascia cord   TOURNIQUET TIME:    Total Tourniquet Time Documented: Upper Arm (Left) - 73 minutes Total: Upper Arm (Left) - 73 minutes    DISPOSITION:  Stable to PACU.   INDICATIONS: Patient is a 75 year old male with a history of a contracture of his left small finger. With a history of Dupuytren's contracture. This has become progressive. He is complaining of numbness and tingling also has a cubital tunnel syndrome but is desirous only having the Dupuytren's treated. Pre-. Postoperative course been discussed along with risks and complications. He is aware that there is no guarantee to the surgery the possibility of infection recurrence injury to arteries nerves tendons incomplete relief symptoms distally possibility of loss of the digit. The preoperative area the patient is seen the extremity marked by both patient and surgeon antibiotic given. A supraclavicular block was carried out without difficulty under the direction of the anesthesia department.  OPERATIVE COURSE: Patient is brought the operating room placed in supine position with left arm free. He was prepped using ChloraPrep. A 3-minute dry time was allowed and timeout taken to confirm patient procedure. The limb was exsanguinated with an Esmarch bandage turn placed on the arm was inflated to 250 mmHg the dissection  was carried from proximal to distal direction in a bruit with Bruner incisions for VY advancement for closure. The cord was identified proximally. Neurovascular bundles were identified proximally. The cord was transected proximally. This was then followed distally taking care to protect neurovascular bundles on both radial and ulnar side. Dissection was carried out with release of the cords to the surrounding the A2 pulley which were extremely dense and in the skin. Abductor digiti quinti cord was present after identification protection to the neurovascular bundles radially and ulnarly at the cord was followed distally to the middle phalanx with dilators. To the A2 pulley and A3 pulley. It was dissected free protecting the pulleys. This was released from the skin palmarly. The cord was dissected free and removed out to the level of the middle phalanx.His finger was able to fully extend.  Neurovascular bundles were then explored and found to be intact over their entire course of both radial and ulnar aspect. The wound was copious irrigated with saline. Thrombin was then instilled into the wound. 2 vessel loop drains were placed and the these converted to wise and the skin closed with interrupted 4-0 nylon sutures. Sterile compressive dressing was applied. The tourniquet deflated and fingers immediately pink. A dorsal splint was applied. He was taken to the recovery room for observation in satisfactory condition. He will be discharged home to return to the hand center [facility] in 1 week on Tylenol ibuprofen for pain he has tramadol for her brought back backup breakthrough.   Daryll Brod, MD Electronically signed, 12/12/20

## 2020-12-12 NOTE — H&P (Addendum)
Oscar [name] is an 75 y.o. male.   Chief Complaint: contracture left small finger HPI: Oscar [name] is a 75 year old male referred by Drs. Cottonwood for consultation regarding a contracture to his left hand and a knot in his right palm. He is not complain of any pain or discomfort. He is not planing of any numbness or tingling. He states is been present for approximately 2 to 2-1/2 years. He has no history of injury. His mother is [place] descent. He does not know his father's. He does not have lumps on his feet or curvature of his penis. He has siblings none of which have problems. He does not recall his parents having problems. He does have a history of diabetes arthritis and gout. He has no history of thyroid problems. Family history is positive diabetes negative for the remainder. His major problem is his left small finger.Marland Kitchen He was referred for nerve conductions which have been done by Dr. Tamsen Roers revealing slowing of the conduction velocity at his elbow to the ulnar nerve. With a sensory study showing low amplitude. There is enlargement at the groove on ultrasound.       Past Medical History:  Diagnosis Date  . Anesthesia complication    PER PT, "HARD TO WAKE' UP PAST SHOULDER SURGERY IN 2016  . Arthritis    lumbar, spondylosis, DDD,stenosis   . Atrial premature depolarization   . Chronic right hip pain    and right leg pain  . Coronary artery disease   . Diabetes mellitus without complication ([place])   . Dysrhythmia    pt. reports that he has been told in the past that he has  irreg. heattbeat   . Edema   . Gout    bil ankles  . Hyperlipidemia   . Hypertension   . Rotator cuff tear    right  . Shortness of breath   . Swelling of extremity    bil feet  . Type 2 diabetes mellitus with hyperglycemia [facility])     Past Surgical History:  Procedure Laterality Date  . BACK SURGERY     January 2014  . CARDIAC CATHETERIZATION     2004, outcome good, to be followed for/with medical therapy   .  ELBOW BURSA SURGERY     removed bursacs both elbows  . SHOULDER ARTHROSCOPY WITH ROTATOR CUFF REPAIR AND SUBACROMIAL DECOMPRESSION Right 08/21/2015   Procedure: SHOULDER ARTHROSCOPY WITH ROTATOR CUFF REPAIR S UBACROMIAL DECOMPRESSION, ;  Surgeon: Tania Ade, MD;  Location: [place];  Service: Orthopedics;  Laterality: Right;  Right shoulder arthroscopy rotator cuff repair vs debridement, subacromial decompression, possible biceps tenotomy.    Family History  Problem Relation Age of Onset  . Heart disease Father   . Diabetes Father   . Heart disease Brother   . Diabetes Paternal Grandmother   . Colon cancer Neg Hx   . Esophageal cancer Neg Hx   . Rectal cancer Neg Hx   . Stomach cancer Neg Hx    Social History:  reports that he quit smoking about 47 years ago. His smoking use included cigarettes. He has a 3.00 pack-year smoking history. He has never used smokeless tobacco. He reports previous alcohol use. He reports that he does not use drugs.  Allergies:  Allergies  Allergen Reactions  . Yellow Fever Vaccine Hives    No medications prior to admission.    No results found for this or any previous visit (from the past 48 hour(s)).  No results  found.   Pertinent items are noted in HPI.  Height 6' (1.829 m), weight 127 kg.  General appearance: alert, cooperative and appears stated age Head: Normocephalic, without obvious abnormality Neck: no JVD Resp: clear to auscultation bilaterally Cardio: regular rate and rhythm, S1, S2 normal, no murmur, click, rub or gallop GI: soft, non-tender; bowel sounds normal; no masses,  no organomegaly Extremities: contracture left small finger Pulses: 2+ and symmetric Skin: Skin color, texture, turgor normal. No rashes or lesions Neurologic: Grossly normal Incision/Wound: na  Assessment/Plan Assessment:  1. Contracture of palmar fascia  2. Entrapment of left ulnar nerve    Plan: We have discussed surgical  intervention with release possible transposition of the ulnar nerve along with a fasciectomy to the small finger. He would like to only have a fasciectomy done at the present time. Pre-peripostoperative course would are discussed along with risk complications. He is aware that there is no guarantee to the surgery possibility of infection recurrence injury to arteries nerves tendons complete relief symptoms dystrophy possibility recurrence possibility of loss of the finger he is scheduled for fasciectomy left small finger as an outpatient under regional anesthesia. He has decided he does not want to have the ulnar nerve dealt with at this time.  He is aware of risks and complications in doing so.  As such we will proceed only with fasciectomy of the Dupuytren's contracture to the small finger.   Daryll Brod 12/12/2020, 4:51 AM

## 2020-12-12 NOTE — Progress Notes (Signed)
Assisted Dr. Elgie Congo with left, ultrasound guided, supraclavicular block. Side rails up, monitors on throughout procedure. See vital signs in flow sheet. Tolerated Procedure well.

## 2020-12-12 NOTE — Anesthesia Preprocedure Evaluation (Addendum)
Anesthesia Evaluation  Patient identified by MRN, date of birth, ID band Patient awake    Reviewed: Allergy & Precautions, NPO status , Patient's Chart, lab work & pertinent test results  Airway Mallampati: III  TM Distance: >3 FB Neck ROM: Full    Dental no notable dental hx.    Pulmonary Sleep apnea: suggestive features of sleep apnea, no formal diagnosis. , former smoker,    Pulmonary exam normal breath sounds clear to auscultation       Cardiovascular Exercise Tolerance: Good hypertension, + CAD  Normal cardiovascular exam+ dysrhythmias  Rhythm:Regular Rate:Normal  09/18/20 Stress test  The left ventricular ejection fraction is mildly decreased (45-54%).  Nuclear stress EF: 49%.  There was no ST segment deviation noted during stress.  Defect 1: There is a medium defect of moderate severity present in the basal inferior, mid inferior and apex location.     Neuro/Psych negative neurological ROS  negative psych ROS   GI/Hepatic negative GI ROS, Neg liver ROS,   Endo/Other  diabetesobesity  Renal/GU negative Renal ROS  negative genitourinary   Musculoskeletal  (+) Arthritis ,   Abdominal Normal abdominal exam  (+)   Peds negative pediatric ROS (+)  Hematology negative hematology ROS (+)   Anesthesia Other Findings   Reproductive/Obstetrics negative OB ROS                            Anesthesia Physical Anesthesia Plan  ASA: III  Anesthesia Plan: Regional   Post-op Pain Management:    Induction: Intravenous  PONV Risk Score and Plan: 1 and Ondansetron, Dexamethasone, Treatment may vary due to age or medical condition and TIVA  Airway Management Planned: Natural Airway and Simple Face Mask  Additional Equipment:   Intra-op Plan:   Post-operative Plan:   Informed Consent: I have reviewed the patients History and Physical, chart, labs and discussed the procedure including  the risks, benefits and alternatives for the proposed anesthesia with the patient or authorized representative who has indicated his/her understanding and acceptance.     Dental advisory given  Plan Discussed with: CRNA and Anesthesiologist  Anesthesia Plan Comments: (Supraclavicular block + propofol gtt. GA/LMA as backup)       Anesthesia Quick Evaluation

## 2020-12-12 NOTE — Anesthesia Procedure Notes (Signed)
Anesthesia Regional Block: Supraclavicular block   Pre-Anesthetic Checklist: ,, timeout performed, Correct Patient, Correct Site, Correct Laterality, Correct Procedure, Correct Position, site marked, Risks and benefits discussed,  Surgical consent,  Pre-op evaluation,  At surgeon's request and post-op pain management  Laterality: Left  Prep: chloraprep       Needles:  Injection technique: Single-shot  Needle Type: Echogenic Stimulator Needle     Needle Length: 10cm  Needle Gauge: 20     Additional Needles:   Procedures:,,,, ultrasound used (permanent image in chart),,,,  Narrative:  Start time: 12/12/2020 7:50 AM End time: 12/12/2020 7:55 AM Injection made incrementally with aspirations every 5 mL.  Performed by: Personally  Anesthesiologist: Merlinda Frederick, MD  Additional Notes: Standard monitors applied. Skin prepped. Good needle visualization with ultrasound. Injection made in 5cc increments with no resistance to injection. Patient tolerated the procedure well.

## 2020-12-12 NOTE — Transfer of Care (Signed)
Immediate Anesthesia Transfer of Care Note  Patient: Oscar [name]  Procedure(s) Performed: FASCIECTOMY LEFT SMALL FINGER (Left Hand)  Patient Location: PACU  Anesthesia Type:MAC combined with regional for post-op pain  Level of Consciousness: drowsy  Airway & Oxygen Therapy: Patient Spontanous Breathing and Patient connected to face mask oxygen  Post-op Assessment: Report given to RN and Post -op Vital signs reviewed and stable  Post vital signs: Reviewed and stable  Last Vitals:  Vitals Value Taken Time  BP    Temp    Pulse 80 12/12/20 1025  Resp 26 12/12/20 1025  SpO2 99 % 12/12/20 1025  Vitals shown include unvalidated device data.  Last Pain:  Vitals:   12/12/20 0738  TempSrc: Oral  PainSc: 0-No pain         Complications: No complications documented.

## 2020-12-13 ENCOUNTER — Encounter (HOSPITAL_BASED_OUTPATIENT_CLINIC_OR_DEPARTMENT_OTHER): Payer: Self-pay | Admitting: Orthopedic Surgery

## 2020-12-13 LAB — SURGICAL PATHOLOGY

## 2020-12-19 DIAGNOSIS — R7989 Other specified abnormal findings of blood chemistry: Secondary | ICD-10-CM | POA: Diagnosis not present

## 2020-12-19 DIAGNOSIS — B009 Herpesviral infection, unspecified: Secondary | ICD-10-CM | POA: Diagnosis not present

## 2020-12-19 DIAGNOSIS — M109 Gout, unspecified: Secondary | ICD-10-CM | POA: Diagnosis not present

## 2020-12-19 DIAGNOSIS — I10 Essential (primary) hypertension: Secondary | ICD-10-CM | POA: Diagnosis not present

## 2020-12-19 DIAGNOSIS — E1169 Type 2 diabetes mellitus with other specified complication: Secondary | ICD-10-CM | POA: Diagnosis not present

## 2020-12-19 DIAGNOSIS — M720 Palmar fascial fibromatosis [Dupuytren]: Secondary | ICD-10-CM | POA: Diagnosis not present

## 2020-12-19 DIAGNOSIS — G629 Polyneuropathy, unspecified: Secondary | ICD-10-CM | POA: Diagnosis not present

## 2020-12-20 DIAGNOSIS — M79641 Pain in right hand: Secondary | ICD-10-CM | POA: Diagnosis not present

## 2020-12-20 DIAGNOSIS — R531 Weakness: Secondary | ICD-10-CM | POA: Diagnosis not present

## 2020-12-20 DIAGNOSIS — M720 Palmar fascial fibromatosis [Dupuytren]: Secondary | ICD-10-CM | POA: Diagnosis not present

## 2020-12-20 DIAGNOSIS — M79642 Pain in left hand: Secondary | ICD-10-CM | POA: Diagnosis not present

## 2020-12-20 DIAGNOSIS — M2560 Stiffness of unspecified joint, not elsewhere classified: Secondary | ICD-10-CM | POA: Diagnosis not present

## 2020-12-20 DIAGNOSIS — M25642 Stiffness of left hand, not elsewhere classified: Secondary | ICD-10-CM | POA: Diagnosis not present

## 2021-01-01 DIAGNOSIS — E1169 Type 2 diabetes mellitus with other specified complication: Secondary | ICD-10-CM | POA: Diagnosis not present

## 2021-01-01 DIAGNOSIS — M109 Gout, unspecified: Secondary | ICD-10-CM | POA: Diagnosis not present

## 2021-01-01 DIAGNOSIS — R7989 Other specified abnormal findings of blood chemistry: Secondary | ICD-10-CM | POA: Diagnosis not present

## 2021-01-01 DIAGNOSIS — G629 Polyneuropathy, unspecified: Secondary | ICD-10-CM | POA: Diagnosis not present

## 2021-01-01 DIAGNOSIS — M720 Palmar fascial fibromatosis [Dupuytren]: Secondary | ICD-10-CM | POA: Diagnosis not present

## 2021-01-01 DIAGNOSIS — B009 Herpesviral infection, unspecified: Secondary | ICD-10-CM | POA: Diagnosis not present

## 2021-01-01 DIAGNOSIS — R002 Palpitations: Secondary | ICD-10-CM | POA: Diagnosis not present

## 2021-01-01 DIAGNOSIS — I10 Essential (primary) hypertension: Secondary | ICD-10-CM | POA: Diagnosis not present

## 2021-01-02 ENCOUNTER — Other Ambulatory Visit: Payer: Self-pay

## 2021-01-02 ENCOUNTER — Encounter: Payer: Self-pay | Admitting: Cardiology

## 2021-01-02 ENCOUNTER — Ambulatory Visit: Payer: Medicare PPO | Admitting: Cardiology

## 2021-01-02 VITALS — BP 132/78 | HR 83 | Ht 72.0 in | Wt 283.0 lb

## 2021-01-02 DIAGNOSIS — Z8249 Family history of ischemic heart disease and other diseases of the circulatory system: Secondary | ICD-10-CM

## 2021-01-02 DIAGNOSIS — I2510 Atherosclerotic heart disease of native coronary artery without angina pectoris: Secondary | ICD-10-CM | POA: Diagnosis not present

## 2021-01-02 DIAGNOSIS — E669 Obesity, unspecified: Secondary | ICD-10-CM | POA: Diagnosis not present

## 2021-01-02 DIAGNOSIS — I10 Essential (primary) hypertension: Secondary | ICD-10-CM | POA: Diagnosis not present

## 2021-01-02 DIAGNOSIS — R002 Palpitations: Secondary | ICD-10-CM

## 2021-01-02 DIAGNOSIS — E785 Hyperlipidemia, unspecified: Secondary | ICD-10-CM | POA: Diagnosis not present

## 2021-01-02 NOTE — Assessment & Plan Note (Signed)
Moderate non obstructive CAD remote cath Myoview low risk Nov 2021

## 2021-01-02 NOTE — Assessment & Plan Note (Signed)
R/O arrhythmia I suggested he avoid caffeine. If he has another episode he'll need a monitor placed.

## 2021-01-02 NOTE — Assessment & Plan Note (Signed)
No symptoms to suggest sleep apnea

## 2021-01-02 NOTE — Patient Instructions (Signed)
Medication Instructions:  Continue current medications  *If you need a refill on your cardiac medications before your next appointment, please call your pharmacy*   Lab Work: None Ordered   Testing/Procedures: None Ordered   Follow-Up: At [facility], you and your health needs are our priority.  As part of our continuing mission to provide you with exceptional heart care, we have created designated Provider Care Teams.  These Care Teams include your primary Cardiologist (physician) and Advanced Practice Providers (APPs -  Physician Assistants and Nurse Practitioners) who all work together to provide you with the care you need, when you need it.  We recommend signing up for the patient portal called "MyChart".  Sign up information is provided on this After Visit Summary.  MyChart is used to connect with patients for Virtual Visits (Telemedicine).  Patients are able to view lab/test results, encounter notes, upcoming appointments, etc.  Non-urgent messages can be sent to your provider as well.   To learn more about what you can do with MyChart, go to [email or web address].    Your next appointment:   3 month(s)  The format for your next appointment:   In Person  Provider:   You may see Pixie Casino, MD or one of the following Advanced Practice Providers on your designated Care Team:    Almyra Deforest, PA-C  Fabian Sharp, PA-C or   Roby Lofts, [place]

## 2021-01-02 NOTE — Progress Notes (Signed)
Cardiology Office Note:    Date:  01/02/2021   ID:  Oscar [name], DOB [birth year], MRN [id number]  PCP:  Sueanne Margarita, DO  Cardiologist:  Pixie Casino, MD  Electrophysiologist:  None   Referring MD: Sueanne Margarita, DO   CC: sent by PCP for  History of Present Illness:    Oscar [name] is a 75 y.o. male with a hx of hypertension, non-insulin-dependent diabetes, nonobstructive coronary disease by past catheterization, dyslipidemia, and obesity.  He has had a history of PACs and PVCs.  Myoview in November 2021 was low risk.  He is seen in the office today at the request of his primary care provider.  I do not have that office note available to me.  The patient tells me he reported to his primary care provider that he had an episode of tachycardia over the weekend.  He said it lasted about an hour.  Patient has had documented PACs and PVCs in the past but no sustained tachycardia.  He admits to me that he drinks [facility], it sounds like he drinks at least 1 a day.  He tells me he has not had a similar episode of sustained tachycardia in the past.  Today he feels fine, he does have PACs and PVCs on his EKG and on exam.  Past Medical History:  Diagnosis Date  . Anesthesia complication    PER PT, "HARD TO WAKE' UP PAST SHOULDER SURGERY IN 2016  . Arthritis    lumbar, spondylosis, DDD,stenosis   . Atrial premature depolarization   . Chronic right hip pain    and right leg pain  . Coronary artery disease   . Diabetes mellitus without complication ([place])   . Dysrhythmia    pt. reports that he has been told in the past that he has  irreg. heattbeat   . Edema   . Gout    bil ankles  . Hyperlipidemia   . Hypertension   . Rotator cuff tear    right  . Shortness of breath   . Swelling of extremity    bil feet  . Type 2 diabetes mellitus with hyperglycemia [facility])     Past Surgical History:  Procedure Laterality Date  . BACK SURGERY     January 2014  . CARDIAC CATHETERIZATION      2004, outcome good, to be followed for/with medical therapy   . ELBOW BURSA SURGERY     removed bursacs both elbows  . FASCIECTOMY Left 12/12/2020   Procedure: FASCIECTOMY LEFT SMALL FINGER;  Surgeon: Daryll Brod, MD;  Location: [place];  Service: Orthopedics;  Laterality: Left;  . SHOULDER ARTHROSCOPY WITH ROTATOR CUFF REPAIR AND SUBACROMIAL DECOMPRESSION Right 08/21/2015   Procedure: SHOULDER ARTHROSCOPY WITH ROTATOR CUFF REPAIR S UBACROMIAL DECOMPRESSION, ;  Surgeon: Tania Ade, MD;  Location: [place];  Service: Orthopedics;  Laterality: Right;  Right shoulder arthroscopy rotator cuff repair vs debridement, subacromial decompression, possible biceps tenotomy.    Current Medications: Current Meds  Medication Sig  . aspirin 81 MG tablet Take 81 mg by mouth daily.  . colchicine 0.6 MG tablet Take 0.6 mg by mouth daily with breakfast.   . furosemide (LASIX) 40 MG tablet Take 20 mg by mouth. Takes 1/2 tablet daily  . hydrocortisone 2.5 % cream Apply topically 2 (two) times daily.  Marland Kitchen losartan (COZAAR) 50 MG tablet Take 50 mg by mouth daily before breakfast.   . metFORMIN (GLUCOPHAGE) 500 MG tablet Take  500 mg by mouth 2 (two) times daily with a meal.  . metoprolol tartrate (LOPRESSOR) 25 MG tablet Take 1 tablet by mouth 2 (two) times daily.  . nabumetone (RELAFEN) 500 MG tablet Take 500 mg by mouth 2 (two) times daily.   Marland Kitchen NITROSTAT 0.4 MG SL tablet Take 1 tablet by mouth as directed.  . pravastatin (PRAVACHOL) 40 MG tablet Take 40 mg by mouth daily with breakfast.   . sildenafil (VIAGRA) 100 MG tablet   . traMADol (ULTRAM) 50 MG tablet Take 1 tablet by mouth 2 (two) times daily.   . valACYclovir (VALTREX) 500 MG tablet Take 500 mg by mouth daily with supper.   . vitamin B-12 (CYANOCOBALAMIN) 500 MCG tablet Take 500 mcg by mouth daily.  . [DISCONTINUED] amLODipine (NORVASC) 5 MG tablet      Allergies:   Yellow fever vaccine   Social History    Socioeconomic History  . Marital status: Married    Spouse name: Not on file  . Number of children: Not on file  . Years of education: Not on file  . Highest education level: Not on file  Occupational History  . Not on file  Tobacco Use  . Smoking status: Former Smoker    Packs/day: 0.30    Years: 10.00    Pack years: 3.00    Types: Cigarettes    Quit date: 11/04/1973    Years since quitting: 47.1  . Smokeless tobacco: Never Used  Vaping Use  . Vaping Use: Never used  Substance and Sexual Activity  . Alcohol use: Not Currently    Comment: rare. wine or beer  . Drug use: No  . Sexual activity: Not on file  Other Topics Concern  . Not on file  Social History Narrative  . Not on file   Social Determinants of Health   Financial Resource Strain: Not on file  Food Insecurity: Not on file  Transportation Needs: Not on file  Physical Activity: Not on file  Stress: Not on file  Social Connections: Not on file     Family History: The patient's family history includes Diabetes in his father and paternal grandmother; Heart disease in his brother and father. There is no history of Colon cancer, Esophageal cancer, Rectal cancer, or Stomach cancer.  ROS:   Please see the history of present illness.  No symptoms to suggest sleep apnea    All other systems reviewed and are negative.  EKGs/Labs/Other Studies Reviewed:    The following studies were reviewed today: Myoview 09/19/2020-  The left ventricular ejection fraction is mildly decreased (45-54%).  Nuclear stress EF: 49%.  There was no ST segment deviation noted during stress.  Defect 1: There is a medium defect of moderate severity present in the basal inferior, mid inferior and apex location.   Mildly abnormal, low risk stress nuclear study with inferior and apical thinning but no ischemia.  Gated ejection fraction 49% but visually appears better.  Suggest echocardiogram to better quantify LV function.    EKG:   EKG is ordered today.  The ekg ordered today demonstrates NSR- HR 82, PAC, PVC, sinus arrhythmia   Recent Labs: 12/08/2020: BUN 12; Creatinine, Ser 1.13; Potassium 4.1; Sodium 140  Recent Lipid Panel No results found for: CHOL, TRIG, HDL, CHOLHDL, VLDL, LDLCALC, LDLDIRECT  Physical Exam:    VS:  BP 132/78   Pulse 83   Ht 6' (1.829 m)   Wt 283 lb (128.4 kg)   SpO2 99%  BMI 38.38 kg/m     Wt Readings from Last 3 Encounters:  01/02/21 283 lb (128.4 kg)  12/12/20 279 lb 8.7 oz (126.8 kg)  10/09/20 282 lb 3.2 oz (128 kg)     GEN: Overweight AA male, well developed in no acute distress HEENT: Normal NECK: No JVD; No carotid bruits CARDIAC: RRR, no murmurs, rubs, gallops, extra systole noted RESPIRATORY:  Clear to auscultation without rales, wheezing or rhonchi  ABDOMEN: Soft, non-tender, non-distended MUSCULOSKELETAL:  No edema; No deformity -dressing Lt hand from recent surgery SKIN: Warm and dry NEUROLOGIC:  Alert and oriented x 3 PSYCHIATRIC:  Normal affect   ASSESSMENT:    Palpitations R/O arrhythmia I suggested he avoid caffeine. If he has another episode he'll need a monitor placed.  CAD in native artery Moderate non obstructive CAD remote cath Myoview low risk Nov 2021  Obesity (BMI 30-39.9) No symptoms to suggest sleep apnea  Essential hypertension Controlled- Normal LVF, grade 1 DD, mild LVH by echo 2017  PLAN:    Avoid caffeine. If he has another episode he'll need an OP monitor placed.  I have request office notes from his PCP and will review when available.    Medication Adjustments/Labs and Tests Ordered: Current medicines are reviewed at length with the patient today.  Concerns regarding medicines are outlined above.  No orders of the defined types were placed in this encounter.  No orders of the defined types were placed in this encounter.   Patient Instructions  Medication Instructions:  Continue current medications  *If you need a refill on  your cardiac medications before your next appointment, please call your pharmacy*   Lab Work: None Ordered   Testing/Procedures: None Ordered   Follow-Up: At [facility], you and your health needs are our priority.  As part of our continuing mission to provide you with exceptional heart care, we have created designated Provider Care Teams.  These Care Teams include your primary Cardiologist (physician) and Advanced Practice Providers (APPs -  Physician Assistants and Nurse Practitioners) who all work together to provide you with the care you need, when you need it.  We recommend signing up for the patient portal called "MyChart".  Sign up information is provided on this After Visit Summary.  MyChart is used to connect with patients for Virtual Visits (Telemedicine).  Patients are able to view lab/test results, encounter notes, upcoming appointments, etc.  Non-urgent messages can be sent to your provider as well.   To learn more about what you can do with MyChart, go to [email or web address].    Your next appointment:   3 month(s)  The format for your next appointment:   In Person  Provider:   You may see Pixie Casino, MD or one of the following Advanced Practice Providers on your designated Care Team:    Almyra Deforest, PA-C  Fabian Sharp, PA-C or   Roby Lofts, PA-C         Signed, Kerin Ransom, [place]  01/02/2021 11:41 AM    [place]

## 2021-01-02 NOTE — Assessment & Plan Note (Signed)
Controlled- Normal LVF, grade 1 DD, mild LVH by echo 2017

## 2021-01-05 NOTE — Addendum Note (Signed)
Addended by: Merri Ray A on: 01/05/2021 04:14 PM   Modules accepted: Orders

## 2021-01-09 DIAGNOSIS — G629 Polyneuropathy, unspecified: Secondary | ICD-10-CM | POA: Diagnosis not present

## 2021-01-09 DIAGNOSIS — I2510 Atherosclerotic heart disease of native coronary artery without angina pectoris: Secondary | ICD-10-CM | POA: Diagnosis not present

## 2021-01-22 DIAGNOSIS — M79641 Pain in right hand: Secondary | ICD-10-CM | POA: Diagnosis not present

## 2021-01-22 DIAGNOSIS — M79642 Pain in left hand: Secondary | ICD-10-CM | POA: Diagnosis not present

## 2021-01-22 DIAGNOSIS — M25642 Stiffness of left hand, not elsewhere classified: Secondary | ICD-10-CM | POA: Diagnosis not present

## 2021-01-22 DIAGNOSIS — M2560 Stiffness of unspecified joint, not elsewhere classified: Secondary | ICD-10-CM | POA: Diagnosis not present

## 2021-01-22 DIAGNOSIS — R531 Weakness: Secondary | ICD-10-CM | POA: Diagnosis not present

## 2021-01-22 DIAGNOSIS — M720 Palmar fascial fibromatosis [Dupuytren]: Secondary | ICD-10-CM | POA: Diagnosis not present

## 2021-02-06 ENCOUNTER — Ambulatory Visit: Payer: Medicare PPO | Admitting: Dermatology

## 2021-02-06 ENCOUNTER — Encounter: Payer: Self-pay | Admitting: Dermatology

## 2021-02-06 ENCOUNTER — Other Ambulatory Visit: Payer: Self-pay

## 2021-02-06 DIAGNOSIS — L409 Psoriasis, unspecified: Secondary | ICD-10-CM | POA: Diagnosis not present

## 2021-02-06 NOTE — Patient Instructions (Signed)
Get 10% Urea Cream from [employer]

## 2021-02-14 DIAGNOSIS — H43813 Vitreous degeneration, bilateral: Secondary | ICD-10-CM | POA: Diagnosis not present

## 2021-02-14 DIAGNOSIS — H25813 Combined forms of age-related cataract, bilateral: Secondary | ICD-10-CM | POA: Diagnosis not present

## 2021-02-14 DIAGNOSIS — E119 Type 2 diabetes mellitus without complications: Secondary | ICD-10-CM | POA: Diagnosis not present

## 2021-02-19 ENCOUNTER — Encounter: Payer: Self-pay | Admitting: Dermatology

## 2021-02-19 NOTE — Progress Notes (Signed)
   Follow-Up Visit   Subjective  Oscar [name] is a 75 y.o. male who presents for the following: Psoriasis (Elbows still thick, groin and buttocks are improved no itching with halobetasol cream and hydrocortisone cream).  Psoriasis Location:  Duration:  Quality:  Associated Signs/Symptoms: Modifying Factors:  Severity:  Timing: Context:   Objective  Well appearing patient in no apparent distress; mood and affect are within normal limits. Objective  Left Elbow - Posterior, Right Elbow - Posterior: Objectively less inflammation both elbows still show a cantholysis with some hyperkeratosis and postinflammatory hyperpigmentation.  I discussed with [name] that this may not respond as well as inflammation to topical therapy.    All skin waist up examined.   Assessment & Plan    Psoriasis (2) Left Elbow - Posterior; Right Elbow - Posterior  He will get an over-the-counter 10% urea cream and try this on his elbows daily for minimal of 4 weeks.  Warned about the possibility of minor irritation.  Follow-up by phone in 1 month.      I, Oscar Monarch, MD, have reviewed all documentation for this visit.  The documentation on 02/19/21 for the exam, diagnosis, procedures, and orders are all accurate and complete.

## 2021-03-26 DIAGNOSIS — M720 Palmar fascial fibromatosis [Dupuytren]: Secondary | ICD-10-CM | POA: Diagnosis not present

## 2021-04-23 DIAGNOSIS — M4316 Spondylolisthesis, lumbar region: Secondary | ICD-10-CM | POA: Diagnosis not present

## 2021-04-23 DIAGNOSIS — I10 Essential (primary) hypertension: Secondary | ICD-10-CM | POA: Diagnosis not present

## 2021-04-30 ENCOUNTER — Telehealth (INDEPENDENT_AMBULATORY_CARE_PROVIDER_SITE_OTHER): Payer: Medicare PPO | Admitting: Internal Medicine

## 2021-04-30 ENCOUNTER — Encounter: Payer: Self-pay | Admitting: Internal Medicine

## 2021-04-30 VITALS — BP 125/75 | HR 70 | Wt 265.0 lb

## 2021-04-30 DIAGNOSIS — E785 Hyperlipidemia, unspecified: Secondary | ICD-10-CM | POA: Diagnosis not present

## 2021-04-30 DIAGNOSIS — I2510 Atherosclerotic heart disease of native coronary artery without angina pectoris: Secondary | ICD-10-CM | POA: Diagnosis not present

## 2021-04-30 DIAGNOSIS — R002 Palpitations: Secondary | ICD-10-CM | POA: Diagnosis not present

## 2021-04-30 DIAGNOSIS — E669 Obesity, unspecified: Secondary | ICD-10-CM

## 2021-04-30 DIAGNOSIS — I10 Essential (primary) hypertension: Secondary | ICD-10-CM | POA: Diagnosis not present

## 2021-04-30 NOTE — Progress Notes (Signed)
Virtual Visit via Video Note   This visit type was conducted due to national recommendations for restrictions regarding the COVID-19 Pandemic (e.g. social distancing) in an effort to limit this patient's exposure and mitigate transmission in our community.  Due to his co-morbid illnesses, this patient is at least at moderate risk for complications without adequate follow up.  This format is felt to be most appropriate for this patient at this time.  All issues noted in this document were discussed and addressed.  A limited physical exam was performed with this format.  Please refer to the patient's chart for his consent to telehealth for [facility].      Date:  04/30/2021   ID:  Oscar [name], DOB 06-Dec-1945, MRN [id number] The patient was identified using 2 identifiers.  Evaluation Performed:  Follow-Up Visit  Patient Location:  [place] [id number]  Provider location:   [address] [place],  [address]  PCP:  Sueanne Margarita, DO  Cardiologist:  Pixie Casino, MD Electrophysiologist:  None   Chief Complaint:  None  History of Present Illness:    Oscar [name] is a 75 y.o. male who presents via audio/video conferencing for a telehealth visit today.  Mr. Hollenbeck is seen today for follow-up video visit.  Overall he is doing well.  He was last seen in March by Oscar Ransom, PA-C.  At that time he was having some more palpitations.  He thought they may be attributed to sodas.  He is completely cut those out of his diet and made some additional changes.  He started on a clinical research trial with the weight loss medication that also may help his diabetes.  He reports a drop in his A1c from 8.1 down to 6.7.  He is also lost 18 pounds, now down to 265 pounds.  He has not necessarily made big changes in his diet or exercise more frequently.  He has not had a recent repeat lipid profile.  He denies any chest pain or worsening shortness of breath.  His  hand pain was probably related to a Dupuytren's contracture, which was successfully operated on.  He says he is near 100% with this.  He denies any chest pain or worsening shortness of breath.  The patient does not have symptoms concerning for COVID-19 infection (fever, chills, cough, or new SHORTNESS OF BREATH).    Prior CV studies:   The following studies were reviewed today:  Chart reviewed, labwork  PMHx:  Past Medical History:  Diagnosis Date   Anesthesia complication    PER PT, "HARD TO WAKE' UP PAST SHOULDER SURGERY IN 2016   Arthritis    lumbar, spondylosis, DDD,stenosis    Atrial premature depolarization    Chronic right hip pain    and right leg pain   Coronary artery disease    Diabetes mellitus without complication ([place])    Dysrhythmia    pt. reports that he has been told in the past that he has  irreg. heattbeat    Edema    Gout    bil ankles   Hyperlipidemia    Hypertension    Rotator cuff tear    right   Shortness of breath    Swelling of extremity    bil feet   Type 2 diabetes mellitus with hyperglycemia [facility])     Past Surgical History:  Procedure Laterality Date   BACK SURGERY     January 2014   CARDIAC  CATHETERIZATION     2004, outcome good, to be followed for/with medical therapy    ELBOW BURSA SURGERY     removed bursacs both elbows   FASCIECTOMY Left 12/12/2020   Procedure: FASCIECTOMY LEFT SMALL FINGER;  Surgeon: Daryll Brod, MD;  Location: [place];  Service: Orthopedics;  Laterality: Left;   SHOULDER ARTHROSCOPY WITH ROTATOR CUFF REPAIR AND SUBACROMIAL DECOMPRESSION Right 08/21/2015   Procedure: SHOULDER ARTHROSCOPY WITH ROTATOR CUFF REPAIR S UBACROMIAL DECOMPRESSION, ;  Surgeon: Tania Ade, MD;  Location: [place];  Service: Orthopedics;  Laterality: Right;  Right shoulder arthroscopy rotator cuff repair vs debridement, subacromial decompression, possible biceps tenotomy.    FAMHx:  Family History   Problem Relation Age of Onset   Heart disease Father    Diabetes Father    Heart disease Brother    Diabetes Paternal Grandmother    Colon cancer Neg Hx    Esophageal cancer Neg Hx    Rectal cancer Neg Hx    Stomach cancer Neg Hx     SOCHx:   reports that he quit smoking about 47 years ago. His smoking use included cigarettes. He has a 3.00 pack-year smoking history. He has never used smokeless tobacco. He reports previous alcohol use. He reports that he does not use drugs.  ALLERGIES:  Allergies  Allergen Reactions   Yellow Fever Vaccine Hives    MEDS:  Current Meds  Medication Sig   amLODipine (NORVASC) 5 MG tablet Take 5 mg by mouth daily.   aspirin 81 MG tablet Take 81 mg by mouth daily.   colchicine 0.6 MG tablet Take 0.6 mg by mouth daily with breakfast.    furosemide (LASIX) 40 MG tablet Take 20 mg by mouth. Takes 1/2 tablet daily   hydrocortisone 2.5 % cream Apply topically 2 (two) times daily.   losartan (COZAAR) 50 MG tablet Take 50 mg by mouth daily before breakfast.    metFORMIN (GLUCOPHAGE) 500 MG tablet Take 500 mg by mouth 2 (two) times daily with a meal.   metoprolol tartrate (LOPRESSOR) 25 MG tablet Take 1 tablet by mouth 2 (two) times daily.   nabumetone (RELAFEN) 500 MG tablet Take 500 mg by mouth 2 (two) times daily as needed.   NITROSTAT 0.4 MG SL tablet Place 0.4 mg under the tongue every 5 (five) minutes as needed.   pravastatin (PRAVACHOL) 40 MG tablet Take 40 mg by mouth daily with breakfast.    sildenafil (VIAGRA) 100 MG tablet    traMADol (ULTRAM) 50 MG tablet Take 1 tablet by mouth 2 (two) times daily as needed.   valACYclovir (VALTREX) 500 MG tablet Take 500 mg by mouth daily as needed.   vitamin B-12 (CYANOCOBALAMIN) 500 MCG tablet Take 500 mcg by mouth daily.     ROS: Pertinent items noted in HPI and remainder of comprehensive ROS otherwise negative.  Labs/Other Tests and Data Reviewed:    Recent Labs: 12/08/2020: BUN 12; Creatinine, Ser  1.13; Potassium 4.1; Sodium 140   Recent Lipid Panel No results found for: CHOL, TRIG, HDL, CHOLHDL, LDLCALC, LDLDIRECT  Wt Readings from Last 3 Encounters:  04/30/21 265 lb (120.2 kg)  01/02/21 283 lb (128.4 kg)  12/12/20 279 lb 8.7 oz (126.8 kg)     Exam:    Vital Signs:  BP 125/75   Pulse 70   Wt 265 lb (120.2 kg)   BMI 35.94 kg/m    General appearance: alert and no distress Lungs: no visual respiratory difficulty  Abdomen: moderately obese Extremities: extremities normal, atraumatic, no cyanosis or edema Skin: Skin color, texture, turgor normal. No rashes or lesions Neurologic: Grossly normal Psych: pleasant  ASSESSMENT & PLAN:    Left arm/left chest tingling, pressure -low risk Myoview stress test (09/2020) Low risk Myoview stress test and normal LVEF by echo 60-65% (2017) PVCs and palpitations Abnormal EKG with T-wave inversions History of moderate nonobstructive coronary artery disease by cath in 2002 Strong family history of premature coronary disease in his father and brother who died of MIs in their 43s Type 2 diabetes Dyslipidemia Hypertension  Overall, Mr. Tarnow is doing well.  He reports his palpitations have resolved.  He denies any chest pain or worsening shortness of breath.  He has had more than 15 pound recent weight loss and is involved in a clinical study of the medication that may be helping for this.  He reports marked improvement in his A1c from 8.1 down to 6.7.  Blood pressure is well controlled today.  No medication changes.  Plan follow-up annually or sooner as necessary.  COVID-19 Education: The signs and symptoms of COVID-19 were discussed with the patient and how to seek care for testing (follow up with PCP or arrange E-visit).  The importance of social distancing was discussed today.  Patient Risk:   After full review of this patients clinical status, I feel that they are at least moderate risk at this time.  Time:   Today, I have spent 25  minutes with the patient with telehealth technology discussing hypertension, dyslipidemia, weight loss, palpitations, history of chest pain.     Medication Adjustments/Labs and Tests Ordered: Current medicines are reviewed at length with the patient today.  Concerns regarding medicines are outlined above.   Tests Ordered: No orders of the defined types were placed in this encounter.   Medication Changes: No orders of the defined types were placed in this encounter.   Disposition:  in 1 year(s)  Pixie Casino, MD, [facility], [place] Director of the Advanced Lipid Disorders &  Cardiovascular Risk Reduction Clinic Diplomate of the American Board of Clinical Lipidology Attending Cardiologist  Direct Dial: [phone]  Fax: [phone]  Website:  [email or web address]  Pixie Casino, MD  04/30/2021 9:39 AM

## 2021-04-30 NOTE — Patient Instructions (Signed)

## 2021-05-30 DIAGNOSIS — U071 COVID-19: Secondary | ICD-10-CM | POA: Diagnosis not present

## 2021-06-04 DIAGNOSIS — M109 Gout, unspecified: Secondary | ICD-10-CM | POA: Diagnosis not present

## 2021-06-04 DIAGNOSIS — B009 Herpesviral infection, unspecified: Secondary | ICD-10-CM | POA: Diagnosis not present

## 2021-06-04 DIAGNOSIS — R002 Palpitations: Secondary | ICD-10-CM | POA: Diagnosis not present

## 2021-06-04 DIAGNOSIS — E1169 Type 2 diabetes mellitus with other specified complication: Secondary | ICD-10-CM | POA: Diagnosis not present

## 2021-06-04 DIAGNOSIS — I10 Essential (primary) hypertension: Secondary | ICD-10-CM | POA: Diagnosis not present

## 2021-06-04 DIAGNOSIS — R7989 Other specified abnormal findings of blood chemistry: Secondary | ICD-10-CM | POA: Diagnosis not present

## 2021-06-04 DIAGNOSIS — I2510 Atherosclerotic heart disease of native coronary artery without angina pectoris: Secondary | ICD-10-CM | POA: Diagnosis not present

## 2021-06-20 ENCOUNTER — Telehealth: Payer: Self-pay | Admitting: Internal Medicine

## 2021-06-20 NOTE — Telephone Encounter (Signed)
Returned call to patient of Dr. Debara Pickett who reports SOB and chest pressure that has been going on and off for 1.5-2 weeks. The symptoms occur with walking (he has to stop & rest) and exertion. The symptoms cause him to pause activities. Length of episodes depends on exertion - he said the episodes are fleeting, not extended in duration. He has not used PRN NTG for symptoms. He said he thought this was ONLY for severe pain/discomfort.   He had COVID end of July  Advised patient he should be seen for eval - last visit was a telehealth appointment   Scheduled for 06/22/21 with Dr. Debara Pickett at [place]

## 2021-06-20 NOTE — Telephone Encounter (Signed)
Pt c/o of Chest Pain: STAT if CP now or developed within 24 hours  1. Are you having CP right now?  No, patient denies chest pain. States he has been having chest pressure. No pressure currently.  2. Are you experiencing any other symptoms (ex. SOB, nausea, vomiting, sweating)?  SOB  3. How long have you been experiencing CP?  Past 2 weeks   4. Is your CP continuous or coming and going?  Coming   5. Have you taken Nitroglycerin?  No  ?

## 2021-06-22 ENCOUNTER — Other Ambulatory Visit: Payer: Self-pay

## 2021-06-22 ENCOUNTER — Ambulatory Visit: Payer: Medicare PPO | Admitting: Internal Medicine

## 2021-06-22 VITALS — BP 118/72 | HR 67 | Ht 72.0 in | Wt 267.2 lb

## 2021-06-22 DIAGNOSIS — E785 Hyperlipidemia, unspecified: Secondary | ICD-10-CM

## 2021-06-22 DIAGNOSIS — I2510 Atherosclerotic heart disease of native coronary artery without angina pectoris: Secondary | ICD-10-CM

## 2021-06-22 DIAGNOSIS — R0602 Shortness of breath: Secondary | ICD-10-CM | POA: Diagnosis not present

## 2021-06-22 DIAGNOSIS — R0789 Other chest pain: Secondary | ICD-10-CM

## 2021-06-22 LAB — BASIC METABOLIC PANEL
BUN/Creatinine Ratio: 10 (ref 10–24)
BUN: 12 mg/dL (ref 8–27)
CO2: 22 mmol/L (ref 20–29)
Calcium: 9.3 mg/dL (ref 8.6–10.2)
Chloride: 105 mmol/L (ref 96–106)
Creatinine, Ser: 1.17 mg/dL (ref 0.76–1.27)
Glucose: 84 mg/dL (ref 65–99)
Potassium: 4.1 mmol/L (ref 3.5–5.2)
Sodium: 143 mmol/L (ref 134–144)
eGFR: 65 mL/min/{1.73_m2} (ref >59)

## 2021-06-22 MED ORDER — NITROSTAT 0.4 MG SL SUBL: 0.4000 mg | SUBLINGUAL_TABLET | SUBLINGUAL | 3 refills | Status: DC | PRN

## 2021-06-22 NOTE — Patient Instructions (Signed)
Medication Instructions:  Your Physician recommend you continue on your current medication as directed.    *If you need a refill on your cardiac medications before your next appointment, please call your pharmacy*   Lab Work: Your physician recommends lab work today (BMP)  If you have labs (blood work) drawn today and your tests are completely normal, you will receive your results only by: MyChart Message (if you have MyChart) OR A paper copy in the mail If you have any lab test that is abnormal or we need to change your treatment, we will call you to review the results.   Testing/Procedures: Cardiac CT Angiography (CTA), is a special type of CT scan that uses a computer to produce multi-dimensional views of major blood vessels throughout the body. In CT angiography, a contrast material is injected through an IV to help visualize the blood vessels [facility]    Follow-Up: At [facility], you and your health needs are our priority.  As part of our continuing mission to provide you with exceptional heart care, we have created designated Provider Care Teams.  These Care Teams include your primary Cardiologist (physician) and Advanced Practice Providers (APPs -  Physician Assistants and Nurse Practitioners) who all work together to provide you with the care you need, when you need it.  We recommend signing up for the patient portal called "MyChart".  Sign up information is provided on this After Visit Summary.  MyChart is used to connect with patients for Virtual Visits (Telemedicine).  Patients are able to view lab/test results, encounter notes, upcoming appointments, etc.  Non-urgent messages can be sent to your provider as well.   To learn more about what you can do with MyChart, go to [email or web address].    Your next appointment:   1 month(s)  The format for your next appointment:   In Person  Provider:   K. Mali Hilty, MD     Your cardiac CT will be scheduled  at one of the below locations:   [facility] [address] [place], [place] [address] [phone]  If scheduled at [facility], please arrive at the [facility] main entrance (entrance A) of [facility] 30 minutes prior to test start time. Proceed to the [facility] Radiology Department (first floor) to check-in and test prep.  If scheduled at [facility], please arrive 15 mins early for check-in and test prep.  Please follow these instructions carefully (unless otherwise directed):  Hold all erectile dysfunction medications at least 3 days (72 hrs) prior to test.  On the Night Before the Test: Be sure to Drink plenty of water. Do not consume any caffeinated/decaffeinated beverages or chocolate 12 hours prior to your test. Do not take any antihistamines 12 hours prior to your test.  On the Day of the Test: Drink plenty of water until 1 hour prior to the test. Do not eat any food 4 hours prior to the test. You may take your regular medications prior to the test.  Take metoprolol (Lopressor) 50 mg two hours prior to test. HOLD Furosemide(lasix) morning of the test.       After the Test: Drink plenty of water. After receiving IV contrast, you may experience a mild flushed feeling. This is normal. On occasion, you may experience a mild rash up to 24 hours after the test. This is not dangerous. If this occurs, you can take Benadryl 25 mg and increase your fluid intake. If you  experience trouble breathing, this can be serious. If it is severe call 911 IMMEDIATELY. If it is mild, please call our office. If you take any of these medications: Glipizide/Metformin, Avandament, Glucavance, please do not take 48 hours after completing test unless otherwise instructed.  Please allow 2-4 weeks for scheduling of routine cardiac CTs. Some insurance companies require a pre-authorization which may delay scheduling of this test.   For  non-scheduling related questions, please contact the cardiac imaging nurse navigator should you have any questions/concerns: Marchia Bond, Cardiac Imaging Nurse Navigator Gordy Clement, Cardiac Imaging Nurse Navigator [facility] Direct Office Dial: [phone]   For scheduling needs, including cancellations and rescheduling, please call [place], [phone].

## 2021-06-22 NOTE — Progress Notes (Signed)
OFFICE NOTE  Chief Complaint:  Chest pressure, shortness of breath  Primary Care Physician: Sueanne Margarita, DO  HPI:  Oscar [name] is a 75 y.o. male who is a history of moderate obesity, dyslipidemia, hypertension and type 2 diabetes. There is a strong family history of heart disease with his father who died of a massive heart attack at age 8 and a brother who died of an MI at age 41. Recently he's noted some increase in palpitations. He saw his primary care provider who did EKGs and noted he was having PACs. He was started on metoprolol tartrate 25 mg twice a day. He reports marked improvement in his symptoms over his EKG still shows PACs. Heart rate however is lower and blood pressure which was in the 0000000 and [id number] systolic is now 0000000 in the office today. Mr. Bjorklund denies any chest pain but notes that he does get short of breath and sweats very easily with minimal exertion. Although his overweight his weight is been elevated for some time and not necessarily increasing to explain the new change in his symptoms. He's felt more fatigued recently as well. Of note he previously seen [place] [facility] in the early 2000's. He underwent heart catheterization 2002, which showed normal LV systolic function and three-vessel nonobstructive coronary disease. He was noted to have a 30% ostial LAD stenosis, a 50% first diagonal branch stenosis, and apical 70% LAD stenosis. The left circumflex had a 40% stenosis in the mid vessel and diffuse 20% distal stenosis. Right coronary artery was codominant and noted to have 50% diffuse stenosis in the mid vessel and a tubular 60% distal stenosis. Medical therapy was recommended. EKG today shows sinus rhythm with PACs and left anterior fascicular block. There was voltage criteria for LVH and lateral T-wave inversions concerning for ischemia.  07/24/2016  Oscar [name] returns today for follow-up of his studies. He underwent nuclear stress testing and ultimately had an  echocardiogram due to abnormal nuclear findings. Nuclear stress test showed no reversible ischemia and a fixed basal inferior defect concerning for probable artifact. LVEF was decreased at 42% however the nuclear readers suggested that the EF appeared to be normal. Subsequent echocardiography showed normal LV function without regional wall motion abnormalities and mild diastolic dysfunction. I shared those results of Oscar [name] today and feel that they're fairly reassuring. He's not describing chest pain but does get short of breath and sweats easily with minimal exertion. This could be weight related. He does note improvement with metoprolol with regards to his PACs. He says he no longer has any palpitations. He is on optimal medical therapy for diabetes, cholesterol and is on daily aspirin.  01/21/2017  Oscar [name] was seen today in follow-up. He denies any worsening shortness of breath or chest pain. His EKG is unchanged showing persistent T-wave inversions. Echo which showed mild diastolic dysfunction with normal LVEF. I've encouraged him to work on exercise and weight loss however there is not been a lot of that. He recently played golf the other weekend said that he was able to walk around without any difficulty but then had to walk several blocks to go to dinner and became a little short of breath. He reports his blood sugars been well controlled with hemoglobin A1c is 6. Cholesterol is at goal as well. He takes only half tablet of Lasix for some intermittent swelling of his legs mostly related to venous hypertension.  03/02/2018  Oscar [name] returns today for follow-up.  Overall  he is done well.  He denies any worsening chest pain or shortness of breath.  He has been playing some golf.  Recently has been having problems with his back.  He is followed by Dr. Sherwood Gambler.  He has had some leg pain which is unclear if it is related to his back or peripheral arterial disease.  He reports cool feet and has answered  yes to some notable lower extremity hair loss.  He has a history of hypertension and dyslipidemia as described.  His EKG actually looks somewhat better today, but does show sinus rhythm with PACs, LVH and nonspecific T wave changes laterally.  Lab work is pending from his PCP, however he said his hemoglobin A1c was 6.3 on metformin.  05/30/2020  Oscar [name] seen today in follow-up.  Is been 2 years since I last saw him.  He has followed up with his PCP back in June.  The time his A1c was 7.9.  Cholesterol was assessed in January.  Total was 172, HDL 37, triglycerides 115 and LDL 112.  His target LDL should probably be less than 70 given diabetes and known CAD by cath in 2002.  He is on pravastatin.  We discussed the possibility of adding ezetimibe or more aggressive dietary invention and weight loss.  It does appear that he is down a few pounds since his visit in June.  He has some difficulty exercising due to back issues.  He recently was started on vitamin B12 with some improvement in what is thought to be peripheral neuropathy as he is described "cool feet" but had essentially normal lower extremity arterial Dopplers in 2019.  08/21/2020  Oscar [name] seen today for follow-up.  He has reported some progressive shortness of breath and fatigue. He occasionally gets tingling in his left arm and left anterior chest wall, sometimes worse with exertion.  Blood pressure was elevated today however at 161/84.  He reports home blood pressure readings are better.  EKG today shows a sinus rhythm with some PACs.  He denies any palpitations or arrhythmias.  He had cholesterol testing in January 2021 showed total cholesterol 172, HDL 37, LDL 112 and triglycerides 115.  Hemoglobin A1c as of June was 7.9.  He is working with his PCP on his blood sugars.  LDL still remains above target.  He has ongoing complaints of cool feet which feel warm but are subjectively cool and has had diagnosis of neuropathy.  He also had lower  extremity arterial Dopplers in the past which were normal.  10/09/2020  Oscar [name] returns today for follow-up.  He underwent stress testing on November 16.  There was a small inferior attenuation artifact which improved with upright imaging.  This is more consistent with bowel shadowing.  No ischemia was noted.  EF was at 49% however an echo in the past that showed a low normal EF around 50%.  He really denies any shortness of breath.  He had been complaining of left arm symptoms really concerning for a radiculopathy.  He saw Dr.Kuzma who did nerve conduction testing and from what he explained to me it sounds like he may have left ulnar nerve entrapment at the elbow and may need surgery.  From a cardiac standpoint he is at acceptable risk for this.  06/22/2021  Oscar [name] is seen today as an urgent follow-up.  He called in the office with concerns about worsening shortness of breath and chest pressure.  This is developed over the past week or  so.  He did have COVID-19 I think in July.  He said he did recover fully from that but the symptoms developed afterwards.  He has had chest discomfort symptoms before.  He had some known mild coronary disease by catheter remotely in 2002 but did have a family history of early onset heart disease and has numerous cardiovascular risk factors including obesity, diabetes, dyslipidemia and hypertension.  He denies any particular chest pain.  EKG today actually shows a normal sinus rhythm with left anterior fascicular block at 67.  Blood pressure is excellent 118/72.  Recent lab work included a lipid profile showing total cholesterol 173, HDL 41, LDL 113 and triglycerides 97.  A1c of 6.2, liver enzymes were normal.  PMHx:  Past Medical History:  Diagnosis Date   Anesthesia complication    PER PT, "HARD TO WAKE' UP PAST SHOULDER SURGERY IN 2016   Arthritis    lumbar, spondylosis, DDD,stenosis    Atrial premature depolarization    Chronic right hip pain    and right leg  pain   Coronary artery disease    Diabetes mellitus without complication ([place])    Dysrhythmia    pt. reports that he has been told in the past that he has  irreg. heattbeat    Edema    Gout    bil ankles   Hyperlipidemia    Hypertension    Rotator cuff tear    right   Shortness of breath    Swelling of extremity    bil feet   Type 2 diabetes mellitus with hyperglycemia [facility])     Past Surgical History:  Procedure Laterality Date   BACK SURGERY     January 2014   CARDIAC CATHETERIZATION     2004, outcome good, to be followed for/with medical therapy    ELBOW BURSA SURGERY     removed bursacs both elbows   FASCIECTOMY Left 12/12/2020   Procedure: FASCIECTOMY LEFT SMALL FINGER;  Surgeon: Daryll Brod, MD;  Location: [facility];  Service: Orthopedics;  Laterality: Left;   SHOULDER ARTHROSCOPY WITH ROTATOR CUFF REPAIR AND SUBACROMIAL DECOMPRESSION Right 08/21/2015   Procedure: SHOULDER ARTHROSCOPY WITH ROTATOR CUFF REPAIR S UBACROMIAL DECOMPRESSION, ;  Surgeon: Tania Ade, MD;  Location: [facility];  Service: Orthopedics;  Laterality: Right;  Right shoulder arthroscopy rotator cuff repair vs debridement, subacromial decompression, possible biceps tenotomy.    FAMHx:  Family History  Problem Relation Age of Onset   Heart disease Father    Diabetes Father    Heart disease Brother    Diabetes Paternal Grandmother    Colon cancer Neg Hx    Esophageal cancer Neg Hx    Rectal cancer Neg Hx    Stomach cancer Neg Hx     SOCHx:   reports that he quit smoking about 47 years ago. His smoking use included cigarettes. He has a 3.00 pack-year smoking history. He has never used smokeless tobacco. He reports that he does not currently use alcohol. He reports that he does not use drugs.  ALLERGIES:  Allergies  Allergen Reactions   Yellow Fever Vaccine Hives    ROS: Pertinent items noted in HPI and remainder of comprehensive ROS otherwise  negative.  HOME MEDS: Current Outpatient Medications  Medication Sig Dispense Refill   amLODipine (NORVASC) 5 MG tablet Take 5 mg by mouth daily.     aspirin 81 MG tablet Take 81 mg by mouth daily.     colchicine 0.6 MG tablet  Take 0.6 mg by mouth daily with breakfast.      furosemide (LASIX) 40 MG tablet Take 20 mg by mouth. Takes 1/2 tablet daily     hydrocortisone 2.5 % cream Apply topically 2 (two) times daily. 30 g 4   losartan (COZAAR) 50 MG tablet Take 50 mg by mouth daily before breakfast.      metFORMIN (GLUCOPHAGE) 500 MG tablet Take 500 mg by mouth 2 (two) times daily with a meal.     metoprolol tartrate (LOPRESSOR) 25 MG tablet Take 1 tablet by mouth 2 (two) times daily.     nabumetone (RELAFEN) 500 MG tablet Take 500 mg by mouth 2 (two) times daily as needed.     pravastatin (PRAVACHOL) 40 MG tablet Take 40 mg by mouth daily with breakfast.      sildenafil (VIAGRA) 100 MG tablet      traMADol (ULTRAM) 50 MG tablet Take 1 tablet by mouth 2 (two) times daily as needed.     valACYclovir (VALTREX) 500 MG tablet Take 500 mg by mouth daily as needed.     vitamin B-12 (CYANOCOBALAMIN) 500 MCG tablet Take 500 mcg by mouth daily.     NITROSTAT 0.4 MG SL tablet Place 1 tablet (0.4 mg total) under the tongue every 5 (five) minutes as needed. 25 tablet 3   No current facility-administered medications for this visit.   Facility-Administered Medications Ordered in Other Visits  Medication Dose Route Frequency Provider Last Rate Last Admin   technetium tetrofosmin (TC-MYOVIEW) injection [id number] millicurie  [id number] millicurie Intravenous Once PRN Lelon Perla, MD        LABS/IMAGING: No results found for this or any previous visit (from the past 48 hour(s)). No results found.  WEIGHTS: Wt Readings from Last 3 Encounters:  06/22/21 267 lb 3.2 oz (121.2 kg)  04/30/21 265 lb (120.2 kg)  01/02/21 283 lb (128.4 kg)    VITALS: BP 118/72   Pulse 67   Ht 6' (1.829 m)   Wt 267 lb 3.2 oz  (121.2 kg)   SpO2 99%   BMI 36.24 kg/m   EXAM: General appearance: alert and no distress Neck: no carotid bruit, no JVD, and thyroid not enlarged, symmetric, no tenderness/mass/nodules Lungs: clear to auscultation bilaterally Heart: regular rate and rhythm, S1, S2 normal, no murmur, click, rub or gallop Abdomen: soft, non-tender; bowel sounds normal; no masses,  no organomegaly and obese Extremities: extremities normal, atraumatic, no cyanosis or edema Pulses: 2+ and symmetric Skin: Skin color, texture, turgor normal. No rashes or lesions Neurologic: Grossly normal Psych: Pleasant  EKG: Sinus rhythm at 67, left anterior fascicular block-personally reviewed  ASSESSMENT: Chest pressure, dyspnea on exertion -low risk Myoview stress test (09/2020) Low risk Myoview stress test and normal LVEF by echo 60-65% (2017) PVCs and palpitations Abnormal EKG with T-wave inversions History of moderate nonobstructive coronary artery disease by cath in 2002 Strong family history of premature coronary disease in his father and brother who died of MIs in their 4s Type 2 diabetes Dyslipidemia Hypertension  PLAN: 1.   Mr. Angeline is again having some chest pressure and now dyspnea on exertion for the past several weeks.  He had COVID-19 back in July but fully recovered for this.  There is a strong family history of heart disease and some mild coronary disease by cath in 2002.  He has multiple coronary risk factors.  He did have a low risk stress test in December of last year.  I would like  for more definitive testing.  We discussed the possibility of cardiac catheterization again but I think would be reasonable to consider coronary CT angiogram.  Heart rate is fairly low and we can give him 50 mg of metoprolol prior to the procedure.  If there is suggestion of significant coronary disease by CT obviously then he would refer her onto catheterization.  If there is only mild nonobstructive disease that is  persistent, then we will consider other modalities to investigate his symptoms.  Follow-up with me afterwards.  Pixie Casino, MD, [facility], [place] Director of the [facility] Diplomate of the American Board of Clinical Lipidology Attending Cardiologist  Direct Dial: [phone]  Fax: [phone]  Website:  [email or web address] 06/22/2021, 10:15 AM

## 2021-06-26 ENCOUNTER — Telehealth (HOSPITAL_COMMUNITY): Payer: Self-pay | Admitting: *Deleted

## 2021-06-26 NOTE — Telephone Encounter (Signed)
Reaching out to patient to offer assistance regarding upcoming cardiac imaging study; pt verbalizes understanding of appt date/time, parking situation and where to check in, pre-test NPO status and medications ordered, and verified current allergies; name and call back number provided for further questions should they arise  Gordy Clement RN Navigator Cardiac Imaging [facility] [phone] office [phone] cell  Patient to take 50mg  metoprolol tartrate two hours prior to cardiac CT scan.

## 2021-06-28 ENCOUNTER — Other Ambulatory Visit: Payer: Self-pay

## 2021-06-28 ENCOUNTER — Telehealth: Payer: Self-pay | Admitting: Internal Medicine

## 2021-06-28 ENCOUNTER — Ambulatory Visit (HOSPITAL_COMMUNITY)
Admission: RE | Admit: 2021-06-28 | Discharge: 2021-06-28 | Disposition: A | Discharge status: Home / Self Care | Payer: Medicare PPO | Source: Ambulatory Visit | Attending: Internal Medicine | Admitting: Internal Medicine

## 2021-06-28 DIAGNOSIS — I2510 Atherosclerotic heart disease of native coronary artery without angina pectoris: Secondary | ICD-10-CM | POA: Insufficient documentation

## 2021-06-28 DIAGNOSIS — R0602 Shortness of breath: Secondary | ICD-10-CM | POA: Insufficient documentation

## 2021-06-28 MED ORDER — METOPROLOL TARTRATE 5 MG/5ML IV SOLN
INTRAVENOUS | Status: AC
  Filled 2021-06-28: qty 5

## 2021-06-28 MED ORDER — NITROGLYCERIN 0.4 MG SL SUBL: 0.8000 mg | SUBLINGUAL_TABLET | Freq: Once | SUBLINGUAL | Status: AC

## 2021-06-28 MED ORDER — IOHEXOL 350 MG/ML SOLN
80.0000 mL | Freq: Once | INTRAVENOUS | Status: AC | PRN
  Administered 2021-06-28: 80 mL via INTRAVENOUS

## 2021-06-28 MED ORDER — NITROGLYCERIN 0.4 MG SL SUBL
SUBLINGUAL_TABLET | SUBLINGUAL | Status: AC
  Administered 2021-06-28: 0.8 mg via SUBLINGUAL
  Filled 2021-06-28: qty 2

## 2021-06-28 MED ORDER — NITROGLYCERIN 0.4 MG SL SUBL: 0.4000 mg | SUBLINGUAL_TABLET | SUBLINGUAL | 3 refills | Status: DC | PRN

## 2021-06-28 MED ORDER — METOPROLOL TARTRATE 5 MG/5ML IV SOLN
2.5000 mg | INTRAVENOUS | Status: DC | PRN
  Administered 2021-06-28: 2.5 mg via INTRAVENOUS

## 2021-06-28 NOTE — Telephone Encounter (Signed)
Returned call to patient and advised of coronary CT results   Oscar Casino, MD  06/28/2021  2:21 PM EDT     CT shows heavily calcified 3 vessel disease - will need definitive cardiac cath. Radiology over-read pending.   Dr. Debara Pickett   Scheduled for sooner visit with MD on 07/05/21 @ 2:30pm to review

## 2021-06-28 NOTE — Telephone Encounter (Signed)
Patient returning call for CT results. 

## 2021-07-05 ENCOUNTER — Ambulatory Visit: Payer: Medicare PPO | Admitting: Internal Medicine

## 2021-07-05 ENCOUNTER — Encounter: Payer: Self-pay | Admitting: Internal Medicine

## 2021-07-05 ENCOUNTER — Other Ambulatory Visit: Payer: Self-pay

## 2021-07-05 VITALS — BP 127/83 | HR 67 | Ht 72.0 in | Wt 263.4 lb

## 2021-07-05 DIAGNOSIS — I200 Unstable angina: Secondary | ICD-10-CM | POA: Diagnosis not present

## 2021-07-05 DIAGNOSIS — R911 Solitary pulmonary nodule: Secondary | ICD-10-CM | POA: Diagnosis not present

## 2021-07-05 DIAGNOSIS — Z01812 Encounter for preprocedural laboratory examination: Secondary | ICD-10-CM

## 2021-07-05 DIAGNOSIS — Z01818 Encounter for other preprocedural examination: Secondary | ICD-10-CM

## 2021-07-05 DIAGNOSIS — I2510 Atherosclerotic heart disease of native coronary artery without angina pectoris: Secondary | ICD-10-CM | POA: Diagnosis not present

## 2021-07-05 NOTE — H&P (View-Only) (Signed)
OFFICE NOTE  Chief Complaint:  Follow-up chest pressure, shortness of breath  Primary Care Physician: Sueanne Margarita, DO  HPI:  LYNDAL TIPTON is a 75 y.o. male who is a history of moderate obesity, dyslipidemia, hypertension and type 2 diabetes. There is a strong family history of heart disease with his father who died of a massive heart attack at age 4 and a brother who died of an MI at age 78. Recently he's noted some increase in palpitations. He saw his primary care provider who did EKGs and noted he was having PACs. He was started on metoprolol tartrate 25 mg twice a day. He reports marked improvement in his symptoms over his EKG still shows PACs. Heart rate however is lower and blood pressure which was in the 0000000 and [id number] systolic is now 0000000 in the office today. Mr. Lanclos denies any chest pain but notes that he does get short of breath and sweats very easily with minimal exertion. Although his overweight his weight is been elevated for some time and not necessarily increasing to explain the new change in his symptoms. He's felt more fatigued recently as well. Of note he previously seen [place] [facility] in the early 2000's. He underwent heart catheterization 2002, which showed normal LV systolic function and three-vessel nonobstructive coronary disease. He was noted to have a 30% ostial LAD stenosis, a 50% first diagonal branch stenosis, and apical 70% LAD stenosis. The left circumflex had a 40% stenosis in the mid vessel and diffuse 20% distal stenosis. Right coronary artery was codominant and noted to have 50% diffuse stenosis in the mid vessel and a tubular 60% distal stenosis. Medical therapy was recommended. EKG today shows sinus rhythm with PACs and left anterior fascicular block. There was voltage criteria for LVH and lateral T-wave inversions concerning for ischemia.  07/24/2016  Mr. Messerli returns today for follow-up of his studies. He underwent nuclear stress testing and ultimately  had an echocardiogram due to abnormal nuclear findings. Nuclear stress test showed no reversible ischemia and a fixed basal inferior defect concerning for probable artifact. LVEF was decreased at 42% however the nuclear readers suggested that the EF appeared to be normal. Subsequent echocardiography showed normal LV function without regional wall motion abnormalities and mild diastolic dysfunction. I shared those results of Mr. Brisbon today and feel that they're fairly reassuring. He's not describing chest pain but does get short of breath and sweats easily with minimal exertion. This could be weight related. He does note improvement with metoprolol with regards to his PACs. He says he no longer has any palpitations. He is on optimal medical therapy for diabetes, cholesterol and is on daily aspirin.  01/21/2017  Mr. Canlas was seen today in follow-up. He denies any worsening shortness of breath or chest pain. His EKG is unchanged showing persistent T-wave inversions. Echo which showed mild diastolic dysfunction with normal LVEF. I've encouraged him to work on exercise and weight loss however there is not been a lot of that. He recently played golf the other weekend said that he was able to walk around without any difficulty but then had to walk several blocks to go to dinner and became a little short of breath. He reports his blood sugars been well controlled with hemoglobin A1c is 6. Cholesterol is at goal as well. He takes only half tablet of Lasix for some intermittent swelling of his legs mostly related to venous hypertension.  03/02/2018  Mr. Buras returns today for follow-up.  Overall he is done well.  He denies any worsening chest pain or shortness of breath.  He has been playing some golf.  Recently has been having problems with his back.  He is followed by Dr. Sherwood Gambler.  He has had some leg pain which is unclear if it is related to his back or peripheral arterial disease.  He reports cool feet and has  answered yes to some notable lower extremity hair loss.  He has a history of hypertension and dyslipidemia as described.  His EKG actually looks somewhat better today, but does show sinus rhythm with PACs, LVH and nonspecific T wave changes laterally.  Lab work is pending from his PCP, however he said his hemoglobin A1c was 6.3 on metformin.  05/30/2020  Mr. Mable seen today in follow-up.  Is been 2 years since I last saw him.  He has followed up with his PCP back in June.  The time his A1c was 7.9.  Cholesterol was assessed in January.  Total was 172, HDL 37, triglycerides 115 and LDL 112.  His target LDL should probably be less than 70 given diabetes and known CAD by cath in 2002.  He is on pravastatin.  We discussed the possibility of adding ezetimibe or more aggressive dietary invention and weight loss.  It does appear that he is down a few pounds since his visit in June.  He has some difficulty exercising due to back issues.  He recently was started on vitamin B12 with some improvement in what is thought to be peripheral neuropathy as he is described "cool feet" but had essentially normal lower extremity arterial Dopplers in 2019.  08/21/2020  Mr. Plazola seen today for follow-up.  He has reported some progressive shortness of breath and fatigue. He occasionally gets tingling in his left arm and left anterior chest wall, sometimes worse with exertion.  Blood pressure was elevated today however at 161/84.  He reports home blood pressure readings are better.  EKG today shows a sinus rhythm with some PACs.  He denies any palpitations or arrhythmias.  He had cholesterol testing in January 2021 showed total cholesterol 172, HDL 37, LDL 112 and triglycerides 115.  Hemoglobin A1c as of June was 7.9.  He is working with his PCP on his blood sugars.  LDL still remains above target.  He has ongoing complaints of cool feet which feel warm but are subjectively cool and has had diagnosis of neuropathy.  He also had lower  extremity arterial Dopplers in the past which were normal.  10/09/2020  Mr. Casaus returns today for follow-up.  He underwent stress testing on November 16.  There was a small inferior attenuation artifact which improved with upright imaging.  This is more consistent with bowel shadowing.  No ischemia was noted.  EF was at 49% however an echo in the past that showed a low normal EF around 50%.  He really denies any shortness of breath.  He had been complaining of left arm symptoms really concerning for a radiculopathy.  He saw Dr.Kuzma who did nerve conduction testing and from what he explained to me it sounds like he may have left ulnar nerve entrapment at the elbow and may need surgery.  From a cardiac standpoint he is at acceptable risk for this.  06/22/2021  Mr. Karas is seen today as an urgent follow-up.  He called in the office with concerns about worsening shortness of breath and chest pressure.  This is developed over the past week  or so.  He did have COVID-19 I think in July.  He said he did recover fully from that but the symptoms developed afterwards.  He has had chest discomfort symptoms before.  He had some known mild coronary disease by catheter remotely in 2002 but did have a family history of early onset heart disease and has numerous cardiovascular risk factors including obesity, diabetes, dyslipidemia and hypertension.  He denies any particular chest pain.  EKG today actually shows a normal sinus rhythm with left anterior fascicular block at 67.  Blood pressure is excellent 118/72.  Recent lab work included a lipid profile showing total cholesterol 173, HDL 41, LDL 113 and triglycerides 97.  A1c of 6.2, liver enzymes were normal.  07/05/2021  Mr. Cisse returns for follow-up.  He was seen just a few weeks ago for progressive dyspnea on exertion and chest pressure.  This had presented and worsened over the past week.  He thought it was related initially to COVID-19 but he did recover somewhat  from the symptoms.  I referred him for coronary CT angiogram which was performed on 06/28/2021.  Image quality was felt to be suboptimal however the calcium score was significantly elevated at 2381.  97th percentile for age and sex matched controls.  Coronary arteries were thought to be diffusely diseased with severe stenosis in the right coronary artery, LAD and circumflex arteries-CAD RADS 4B.  Based on these findings, cardiac catheterization was recommended.  I reviewed the images and I agree with this recommendation.  I discussed this with the patient and his wife today as well as his daughter via teleconference.  We did discuss the procedure which will likely be radial catheterization at length including the risk, benefits and alternatives today and he is agreeable to proceed.  PMHx:  Past Medical History:  Diagnosis Date   Anesthesia complication    PER PT, "HARD TO WAKE' UP PAST SHOULDER SURGERY IN 2016   Arthritis    lumbar, spondylosis, DDD,stenosis    Atrial premature depolarization    Chronic right hip pain    and right leg pain   Coronary artery disease    Diabetes mellitus without complication ([place])    Dysrhythmia    pt. reports that he has been told in the past that he has  irreg. heattbeat    Edema    Gout    bil ankles   Hyperlipidemia    Hypertension    Rotator cuff tear    right   Shortness of breath    Swelling of extremity    bil feet   Type 2 diabetes mellitus with hyperglycemia [facility])     Past Surgical History:  Procedure Laterality Date   BACK SURGERY     January 2014   CARDIAC CATHETERIZATION     2004, outcome good, to be followed for/with medical therapy    ELBOW BURSA SURGERY     removed bursacs both elbows   FASCIECTOMY Left 12/12/2020   Procedure: FASCIECTOMY LEFT SMALL FINGER;  Surgeon: Daryll Brod, MD;  Location: [facility];  Service: Orthopedics;  Laterality: Left;   SHOULDER ARTHROSCOPY WITH ROTATOR CUFF REPAIR AND SUBACROMIAL  DECOMPRESSION Right 08/21/2015   Procedure: SHOULDER ARTHROSCOPY WITH ROTATOR CUFF REPAIR S UBACROMIAL DECOMPRESSION, ;  Surgeon: Tania Ade, MD;  Location: [facility];  Service: Orthopedics;  Laterality: Right;  Right shoulder arthroscopy rotator cuff repair vs debridement, subacromial decompression, possible biceps tenotomy.    FAMHx:  Family History  Problem  Relation Age of Onset   Heart disease Father    Diabetes Father    Heart disease Brother    Diabetes Paternal Grandmother    Colon cancer Neg Hx    Esophageal cancer Neg Hx    Rectal cancer Neg Hx    Stomach cancer Neg Hx     SOCHx:   reports that he quit smoking about 47 years ago. His smoking use included cigarettes. He has a 3.00 pack-year smoking history. He has never used smokeless tobacco. He reports that he does not currently use alcohol. He reports that he does not use drugs.  ALLERGIES:  Allergies  Allergen Reactions   Yellow Fever Vaccine Hives    ROS: Pertinent items noted in HPI and remainder of comprehensive ROS otherwise negative.  HOME MEDS: Current Outpatient Medications  Medication Sig Dispense Refill   amLODipine (NORVASC) 5 MG tablet Take 5 mg by mouth daily.     aspirin 81 MG tablet Take 81 mg by mouth daily.     colchicine 0.6 MG tablet Take 0.6 mg by mouth daily with breakfast.      furosemide (LASIX) 40 MG tablet Take 20 mg by mouth. Takes 1/2 tablet daily     hydrocortisone 2.5 % cream Apply topically 2 (two) times daily. 30 g 4   losartan (COZAAR) 50 MG tablet Take 50 mg by mouth daily before breakfast.      metFORMIN (GLUCOPHAGE) 500 MG tablet Take 500 mg by mouth 2 (two) times daily with a meal.     metoprolol tartrate (LOPRESSOR) 25 MG tablet Take 1 tablet by mouth 2 (two) times daily.     nabumetone (RELAFEN) 500 MG tablet Take 500 mg by mouth 2 (two) times daily as needed.     nitroGLYCERIN (NITROSTAT) 0.4 MG SL tablet Place 1 tablet (0.4 mg total) under the tongue  every 5 (five) minutes as needed. Take up to 3 doses in 15 minutes. 25 tablet 3   pravastatin (PRAVACHOL) 40 MG tablet Take 40 mg by mouth daily with breakfast.      sildenafil (VIAGRA) 100 MG tablet      traMADol (ULTRAM) 50 MG tablet Take 1 tablet by mouth 2 (two) times daily as needed.     vitamin B-12 (CYANOCOBALAMIN) 500 MCG tablet Take 500 mcg by mouth daily.     No current facility-administered medications for this visit.   Facility-Administered Medications Ordered in Other Visits  Medication Dose Route Frequency Provider Last Rate Last Admin   technetium tetrofosmin (TC-MYOVIEW) injection [id number] millicurie  [id number] millicurie Intravenous Once PRN Lelon Perla, MD        LABS/IMAGING: No results found for this or any previous visit (from the past 48 hour(s)). No results found.  WEIGHTS: Wt Readings from Last 3 Encounters:  07/05/21 263 lb 6.4 oz (119.5 kg)  06/22/21 267 lb 3.2 oz (121.2 kg)  04/30/21 265 lb (120.2 kg)    VITALS: BP 127/83   Pulse 67   Ht 6' (1.829 m)   Wt 263 lb 6.4 oz (119.5 kg)   SpO2 97%   BMI 35.72 kg/m   EXAM: Deferred  EKG: Deferred  ASSESSMENT: Abnormal coronary CT angiogram with calcium score 2381, 97th percentile and three-vessel coronary disease (06/28/2021). Chest pressure, dyspnea on exertion -low risk Myoview stress test (09/2020) Low risk Myoview stress test and normal LVEF by echo 60-65% (2017) PVCs and palpitations Abnormal EKG with T-wave inversions History of moderate nonobstructive coronary artery disease by cath in  2002 Strong family history of premature coronary disease in his father and brother who died of MIs in their 76s Type 2 diabetes Dyslipidemia Hypertension  PLAN: 1.   Mr. Nazzal appears to have extensive three-vessel coronary disease with a very high calcium score although the image quality was suboptimal.  His symptoms are concerning for unstable angina and therefore would recommend a direct cardiac  catheterization.  As mentioned above we discussed the risks, benefits and alternatives to cardiac catheterization he is agreeable to proceed.  We will arrange this for next week.  He is on appropriate medical therapy and has nitroglycerin to use if he has any recurrent symptoms.  We will plan follow-up with me in a few months.  Finally, he was found to have a small subcentimeter lung nodule which will require follow-up by CT that we will order.  Pixie Casino, MD, [facility], [place] Director of the Advanced Lipid Disorders &  Cardiovascular Risk Reduction Clinic Diplomate of the American Board of Clinical Lipidology Attending Cardiologist  Direct Dial: [phone]  Fax: [phone]  Website:  [email or web address]  Nadean Corwin Esker Dever 07/05/2021, 3:21 PM

## 2021-07-05 NOTE — Patient Instructions (Signed)
Medication Instructions:  Your physician recommends that you continue on your current medications as directed. Please refer to the Current Medication list given to you today.  *If you need a refill on your cardiac medications before your next appointment, please call your pharmacy*   Testing/Procedures:  Cardiac Catheterization @ [facility] on Thursday 07/12/21  Chest CT to follow up on lung nodule in 1 year -- [facility]   Follow-Up: At [facility], you and your health needs are our priority.  As part of our continuing mission to provide you with exceptional heart care, we have created designated Provider Care Teams.  These Care Teams include your primary Cardiologist (physician) and Advanced Practice Providers (APPs -  Physician Assistants and Nurse Practitioners) who all work together to provide you with the care you need, when you need it.  We recommend signing up for the patient portal called "MyChart".  Sign up information is provided on this After Visit Summary.  MyChart is used to connect with patients for Virtual Visits (Telemedicine).  Patients are able to view lab/test results, encounter notes, upcoming appointments, etc.  Non-urgent messages can be sent to your provider as well.   To learn more about what you can do with MyChart, go to [email or web address].    Your next appointment:   4-6 week(s) - post cath  The format for your next appointment:   In Person  Provider:   You may see Pixie Casino, MD or one of the following Advanced Practice Providers on your designated Care Team:   Almyra Deforest, PA-C Fabian Sharp, [place] or  Roby Lofts, [place]   Other Instructions  [place] [address] Dept: [phone] Loc: [place]  07/05/2021  You are scheduled for a Cardiac Catheterization on Thursday, September 8 with Dr.  [name].  1. Please arrive at the [facility] (Main Entrance A) at [facility]: [address] [place], [place] [address] at 8:30 AM (This time is two hours before your procedure to ensure your preparation). Free valet parking service is available.   Special note: Every effort is made to have your procedure done on time. Please understand that emergencies sometimes delay scheduled procedures.  2. Diet: Do not eat solid foods after midnight.  You may have clear liquids until 5am upon the day of the procedure.  3. Labs: NONE  4. Medication instructions in preparation for your procedure:  DO NOT TAKE METFORMIN the day of procedure or 48 hours after procedure   On the morning of your procedure, take your Aspirin and any morning medicines NOT listed above.  You may use sips of water.  5. Plan for one night stay--bring personal belongings. 6. Bring a current list of your medications and current insurance cards. 7. You MUST have a responsible person to drive you home. 8. Someone MUST be with you the first 24 hours after you arrive home or your discharge will be delayed. 9. Please wear clothes that are easy to get on and off and wear slip-on shoes.  Thank you for allowing [place] to care for you!   -- [place] [facility]

## 2021-07-05 NOTE — Progress Notes (Signed)
OFFICE NOTE  Chief Complaint:  Follow-up chest pressure, shortness of breath  Primary Care Physician: Sueanne Margarita, DO  HPI:  Oscar [name] is a 75 y.o. male who is a history of moderate obesity, dyslipidemia, hypertension and type 2 diabetes. There is a strong family history of heart disease with his father who died of a massive heart attack at age 51 and a brother who died of an MI at age 58. Recently he's noted some increase in palpitations. He saw his primary care provider who did EKGs and noted he was having PACs. He was started on metoprolol tartrate 25 mg twice a day. He reports marked improvement in his symptoms over his EKG still shows PACs. Heart rate however is lower and blood pressure which was in the 0000000 and [id number] systolic is now 0000000 in the office today. Oscar [name] denies any chest pain but notes that he does get short of breath and sweats very easily with minimal exertion. Although his overweight his weight is been elevated for some time and not necessarily increasing to explain the new change in his symptoms. He's felt more fatigued recently as well. Of note he previously seen [place] [facility] in the early 2000's. He underwent heart catheterization 2002, which showed normal LV systolic function and three-vessel nonobstructive coronary disease. He was noted to have a 30% ostial LAD stenosis, a 50% first diagonal branch stenosis, and apical 70% LAD stenosis. The left circumflex had a 40% stenosis in the mid vessel and diffuse 20% distal stenosis. Right coronary artery was codominant and noted to have 50% diffuse stenosis in the mid vessel and a tubular 60% distal stenosis. Medical therapy was recommended. EKG today shows sinus rhythm with PACs and left anterior fascicular block. There was voltage criteria for LVH and lateral T-wave inversions concerning for ischemia.  07/24/2016  Oscar [name] returns today for follow-up of his studies. He underwent nuclear stress testing and ultimately  had an echocardiogram due to abnormal nuclear findings. Nuclear stress test showed no reversible ischemia and a fixed basal inferior defect concerning for probable artifact. LVEF was decreased at 42% however the nuclear readers suggested that the EF appeared to be normal. Subsequent echocardiography showed normal LV function without regional wall motion abnormalities and mild diastolic dysfunction. I shared those results of Oscar [name] today and feel that they're fairly reassuring. He's not describing chest pain but does get short of breath and sweats easily with minimal exertion. This could be weight related. He does note improvement with metoprolol with regards to his PACs. He says he no longer has any palpitations. He is on optimal medical therapy for diabetes, cholesterol and is on daily aspirin.  01/21/2017  Oscar [name] was seen today in follow-up. He denies any worsening shortness of breath or chest pain. His EKG is unchanged showing persistent T-wave inversions. Echo which showed mild diastolic dysfunction with normal LVEF. I've encouraged him to work on exercise and weight loss however there is not been a lot of that. He recently played golf the other weekend said that he was able to walk around without any difficulty but then had to walk several blocks to go to dinner and became a little short of breath. He reports his blood sugars been well controlled with hemoglobin A1c is 6. Cholesterol is at goal as well. He takes only half tablet of Lasix for some intermittent swelling of his legs mostly related to venous hypertension.  03/02/2018  Oscar [name] returns today for follow-up.  Overall he is done well.  He denies any worsening chest pain or shortness of breath.  He has been playing some golf.  Recently has been having problems with his back.  He is followed by Dr. Sherwood Gambler.  He has had some leg pain which is unclear if it is related to his back or peripheral arterial disease.  He reports cool feet and has  answered yes to some notable lower extremity hair loss.  He has a history of hypertension and dyslipidemia as described.  His EKG actually looks somewhat better today, but does show sinus rhythm with PACs, LVH and nonspecific T wave changes laterally.  Lab work is pending from his PCP, however he said his hemoglobin A1c was 6.3 on metformin.  05/30/2020  Oscar [name] seen today in follow-up.  Is been 2 years since I last saw him.  He has followed up with his PCP back in June.  The time his A1c was 7.9.  Cholesterol was assessed in January.  Total was 172, HDL 37, triglycerides 115 and LDL 112.  His target LDL should probably be less than 70 given diabetes and known CAD by cath in 2002.  He is on pravastatin.  We discussed the possibility of adding ezetimibe or more aggressive dietary invention and weight loss.  It does appear that he is down a few pounds since his visit in June.  He has some difficulty exercising due to back issues.  He recently was started on vitamin B12 with some improvement in what is thought to be peripheral neuropathy as he is described "cool feet" but had essentially normal lower extremity arterial Dopplers in 2019.  08/21/2020  Oscar [name] seen today for follow-up.  He has reported some progressive shortness of breath and fatigue. He occasionally gets tingling in his left arm and left anterior chest wall, sometimes worse with exertion.  Blood pressure was elevated today however at 161/84.  He reports home blood pressure readings are better.  EKG today shows a sinus rhythm with some PACs.  He denies any palpitations or arrhythmias.  He had cholesterol testing in January 2021 showed total cholesterol 172, HDL 37, LDL 112 and triglycerides 115.  Hemoglobin A1c as of June was 7.9.  He is working with his PCP on his blood sugars.  LDL still remains above target.  He has ongoing complaints of cool feet which feel warm but are subjectively cool and has had diagnosis of neuropathy.  He also had lower  extremity arterial Dopplers in the past which were normal.  10/09/2020  Oscar [name] returns today for follow-up.  He underwent stress testing on November 16.  There was a small inferior attenuation artifact which improved with upright imaging.  This is more consistent with bowel shadowing.  No ischemia was noted.  EF was at 49% however an echo in the past that showed a low normal EF around 50%.  He really denies any shortness of breath.  He had been complaining of left arm symptoms really concerning for a radiculopathy.  He saw Dr.Kuzma who did nerve conduction testing and from what he explained to me it sounds like he may have left ulnar nerve entrapment at the elbow and may need surgery.  From a cardiac standpoint he is at acceptable risk for this.  06/22/2021  Oscar [name] is seen today as an urgent follow-up.  He called in the office with concerns about worsening shortness of breath and chest pressure.  This is developed over the past week  or so.  He did have COVID-19 I think in July.  He said he did recover fully from that but the symptoms developed afterwards.  He has had chest discomfort symptoms before.  He had some known mild coronary disease by catheter remotely in 2002 but did have a family history of early onset heart disease and has numerous cardiovascular risk factors including obesity, diabetes, dyslipidemia and hypertension.  He denies any particular chest pain.  EKG today actually shows a normal sinus rhythm with left anterior fascicular block at 67.  Blood pressure is excellent 118/72.  Recent lab work included a lipid profile showing total cholesterol 173, HDL 41, LDL 113 and triglycerides 97.  A1c of 6.2, liver enzymes were normal.  07/05/2021  Oscar [name] returns for follow-up.  He was seen just a few weeks ago for progressive dyspnea on exertion and chest pressure.  This had presented and worsened over the past week.  He thought it was related initially to COVID-19 but he did recover somewhat  from the symptoms.  I referred him for coronary CT angiogram which was performed on 06/28/2021.  Image quality was felt to be suboptimal however the calcium score was significantly elevated at 2381.  97th percentile for age and sex matched controls.  Coronary arteries were thought to be diffusely diseased with severe stenosis in the right coronary artery, LAD and circumflex arteries-CAD RADS 4B.  Based on these findings, cardiac catheterization was recommended.  I reviewed the images and I agree with this recommendation.  I discussed this with the patient and his wife today as well as his daughter via teleconference.  We did discuss the procedure which will likely be radial catheterization at length including the risk, benefits and alternatives today and he is agreeable to proceed.  PMHx:  Past Medical History:  Diagnosis Date   Anesthesia complication    PER PT, "HARD TO WAKE' UP PAST SHOULDER SURGERY IN 2016   Arthritis    lumbar, spondylosis, DDD,stenosis    Atrial premature depolarization    Chronic right hip pain    and right leg pain   Coronary artery disease    Diabetes mellitus without complication ([place])    Dysrhythmia    pt. reports that he has been told in the past that he has  irreg. heattbeat    Edema    Gout    bil ankles   Hyperlipidemia    Hypertension    Rotator cuff tear    right   Shortness of breath    Swelling of extremity    bil feet   Type 2 diabetes mellitus with hyperglycemia [facility])     Past Surgical History:  Procedure Laterality Date   BACK SURGERY     January 2014   CARDIAC CATHETERIZATION     2004, outcome good, to be followed for/with medical therapy    ELBOW BURSA SURGERY     removed bursacs both elbows   FASCIECTOMY Left 12/12/2020   Procedure: FASCIECTOMY LEFT SMALL FINGER;  Surgeon: Daryll Brod, MD;  Location: [place];  Service: Orthopedics;  Laterality: Left;   SHOULDER ARTHROSCOPY WITH ROTATOR CUFF REPAIR AND SUBACROMIAL  DECOMPRESSION Right 08/21/2015   Procedure: SHOULDER ARTHROSCOPY WITH ROTATOR CUFF REPAIR S UBACROMIAL DECOMPRESSION, ;  Surgeon: Tania Ade, MD;  Location: [place];  Service: Orthopedics;  Laterality: Right;  Right shoulder arthroscopy rotator cuff repair vs debridement, subacromial decompression, possible biceps tenotomy.    FAMHx:  Family History  Problem  Relation Age of Onset   Heart disease Father    Diabetes Father    Heart disease Brother    Diabetes Paternal Grandmother    Colon cancer Neg Hx    Esophageal cancer Neg Hx    Rectal cancer Neg Hx    Stomach cancer Neg Hx     SOCHx:   reports that he quit smoking about 47 years ago. His smoking use included cigarettes. He has a 3.00 pack-year smoking history. He has never used smokeless tobacco. He reports that he does not currently use alcohol. He reports that he does not use drugs.  ALLERGIES:  Allergies  Allergen Reactions   Yellow Fever Vaccine Hives    ROS: Pertinent items noted in HPI and remainder of comprehensive ROS otherwise negative.  HOME MEDS: Current Outpatient Medications  Medication Sig Dispense Refill   amLODipine (NORVASC) 5 MG tablet Take 5 mg by mouth daily.     aspirin 81 MG tablet Take 81 mg by mouth daily.     colchicine 0.6 MG tablet Take 0.6 mg by mouth daily with breakfast.      furosemide (LASIX) 40 MG tablet Take 20 mg by mouth. Takes 1/2 tablet daily     hydrocortisone 2.5 % cream Apply topically 2 (two) times daily. 30 g 4   losartan (COZAAR) 50 MG tablet Take 50 mg by mouth daily before breakfast.      metFORMIN (GLUCOPHAGE) 500 MG tablet Take 500 mg by mouth 2 (two) times daily with a meal.     metoprolol tartrate (LOPRESSOR) 25 MG tablet Take 1 tablet by mouth 2 (two) times daily.     nabumetone (RELAFEN) 500 MG tablet Take 500 mg by mouth 2 (two) times daily as needed.     nitroGLYCERIN (NITROSTAT) 0.4 MG SL tablet Place 1 tablet (0.4 mg total) under the tongue  every 5 (five) minutes as needed. Take up to 3 doses in 15 minutes. 25 tablet 3   pravastatin (PRAVACHOL) 40 MG tablet Take 40 mg by mouth daily with breakfast.      sildenafil (VIAGRA) 100 MG tablet      traMADol (ULTRAM) 50 MG tablet Take 1 tablet by mouth 2 (two) times daily as needed.     vitamin B-12 (CYANOCOBALAMIN) 500 MCG tablet Take 500 mcg by mouth daily.     No current facility-administered medications for this visit.   Facility-Administered Medications Ordered in Other Visits  Medication Dose Route Frequency Provider Last Rate Last Admin   technetium tetrofosmin (TC-MYOVIEW) injection [id number] millicurie  [id number] millicurie Intravenous Once PRN Lelon Perla, MD        LABS/IMAGING: No results found for this or any previous visit (from the past 48 hour(s)). No results found.  WEIGHTS: Wt Readings from Last 3 Encounters:  07/05/21 263 lb 6.4 oz (119.5 kg)  06/22/21 267 lb 3.2 oz (121.2 kg)  04/30/21 265 lb (120.2 kg)    VITALS: BP 127/83   Pulse 67   Ht 6' (1.829 m)   Wt 263 lb 6.4 oz (119.5 kg)   SpO2 97%   BMI 35.72 kg/m   EXAM: Deferred  EKG: Deferred  ASSESSMENT: Abnormal coronary CT angiogram with calcium score 2381, 97th percentile and three-vessel coronary disease (06/28/2021). Chest pressure, dyspnea on exertion -low risk Myoview stress test (09/2020) Low risk Myoview stress test and normal LVEF by echo 60-65% (2017) PVCs and palpitations Abnormal EKG with T-wave inversions History of moderate nonobstructive coronary artery disease by cath in  2002 Strong family history of premature coronary disease in his father and brother who died of MIs in their 97s Type 2 diabetes Dyslipidemia Hypertension  PLAN: 1.   Oscar [name] appears to have extensive three-vessel coronary disease with a very high calcium score although the image quality was suboptimal.  His symptoms are concerning for unstable angina and therefore would recommend a direct cardiac  catheterization.  As mentioned above we discussed the risks, benefits and alternatives to cardiac catheterization he is agreeable to proceed.  We will arrange this for next week.  He is on appropriate medical therapy and has nitroglycerin to use if he has any recurrent symptoms.  We will plan follow-up with me in a few months.  Finally, he was found to have a small subcentimeter lung nodule which will require follow-up by CT that we will order.  Pixie Casino, MD, [facility], [place] Director of the [facility]  Cardi[facility] Diplomate of the American Board of Clinical Lipidology Attending Cardiologist  Direct Dial: [phone]  Fax: [phone]  Website:  [email or web address]  Nadean Corwin Kamsiyochukwu Buist 07/05/2021, 3:21 PM

## 2021-07-11 ENCOUNTER — Telehealth: Payer: Self-pay | Admitting: *Deleted

## 2021-07-11 NOTE — Telephone Encounter (Signed)
Cardiac catheterization scheduled at [facility] for: Thursday July 12, 2021 10:30 AM [facility] Main Entrance [facility]) at: 8:30 AM   No solid food after midnight prior to cath, clear liquids until 5 AM day of procedure.  Medication instructions: Hold: Lasix-AM of procedure Metformin-day of procedure and 48 hours post procedure  Except hold medications morning medications can be taken pre-cath with sips of water including aspirin 81 mg.    Confirmed patient has responsible adult to drive home post procedure and be with patient first 24 hours after arriving home.  Patients are allowed one visitor in the waiting room during the time they are at the hospital for their procedure. Both patient and visitor must wear a mask once they enter the hospital.   Patient reports does not currently have any symptoms concerning for COVID-19 and no household members with COVID-19 like illness.       Reviewed procedure/mask/visitor instructions with patient.  Patient aware he will need CBC on arrival to Short Stay day of procedure.

## 2021-07-12 ENCOUNTER — Encounter (HOSPITAL_COMMUNITY)
Admission: AD | Disposition: A | Discharge status: Home Health | Payer: Self-pay | Source: Home / Self Care | Attending: Thoracic Surgery (Cardiothoracic Vascular Surgery)

## 2021-07-12 ENCOUNTER — Encounter (HOSPITAL_COMMUNITY): Payer: Self-pay | Admitting: Cardiovascular Disease

## 2021-07-12 ENCOUNTER — Inpatient Hospital Stay (HOSPITAL_COMMUNITY)
Admission: AD | Admit: 2021-07-12 | Discharge: 2021-07-25 | DRG: 234 | Disposition: A | Discharge status: Home Health | Payer: Medicare PPO | Attending: Thoracic Surgery (Cardiothoracic Vascular Surgery) | Admitting: Thoracic Surgery (Cardiothoracic Vascular Surgery)

## 2021-07-12 ENCOUNTER — Ambulatory Visit: Payer: Medicare PPO | Admitting: Internal Medicine

## 2021-07-12 ENCOUNTER — Other Ambulatory Visit: Payer: Self-pay

## 2021-07-12 DIAGNOSIS — I10 Essential (primary) hypertension: Secondary | ICD-10-CM | POA: Diagnosis present

## 2021-07-12 DIAGNOSIS — I200 Unstable angina: Secondary | ICD-10-CM | POA: Diagnosis present

## 2021-07-12 DIAGNOSIS — I252 Old myocardial infarction: Secondary | ICD-10-CM | POA: Diagnosis not present

## 2021-07-12 DIAGNOSIS — D62 Acute posthemorrhagic anemia: Secondary | ICD-10-CM | POA: Diagnosis not present

## 2021-07-12 DIAGNOSIS — Z79899 Other long term (current) drug therapy: Secondary | ICD-10-CM | POA: Diagnosis not present

## 2021-07-12 DIAGNOSIS — Z8616 Personal history of COVID-19: Secondary | ICD-10-CM

## 2021-07-12 DIAGNOSIS — Z8249 Family history of ischemic heart disease and other diseases of the circulatory system: Secondary | ICD-10-CM | POA: Diagnosis not present

## 2021-07-12 DIAGNOSIS — I517 Cardiomegaly: Secondary | ICD-10-CM | POA: Diagnosis not present

## 2021-07-12 DIAGNOSIS — I444 Left anterior fascicular block: Secondary | ICD-10-CM | POA: Diagnosis present

## 2021-07-12 DIAGNOSIS — Z833 Family history of diabetes mellitus: Secondary | ICD-10-CM | POA: Diagnosis not present

## 2021-07-12 DIAGNOSIS — G8929 Other chronic pain: Secondary | ICD-10-CM | POA: Diagnosis present

## 2021-07-12 DIAGNOSIS — Z951 Presence of aortocoronary bypass graft: Secondary | ICD-10-CM

## 2021-07-12 DIAGNOSIS — Z6837 Body mass index (BMI) 37.0-37.9, adult: Secondary | ICD-10-CM

## 2021-07-12 DIAGNOSIS — E1151 Type 2 diabetes mellitus with diabetic peripheral angiopathy without gangrene: Secondary | ICD-10-CM | POA: Diagnosis not present

## 2021-07-12 DIAGNOSIS — R001 Bradycardia, unspecified: Secondary | ICD-10-CM | POA: Diagnosis not present

## 2021-07-12 DIAGNOSIS — R911 Solitary pulmonary nodule: Secondary | ICD-10-CM | POA: Diagnosis present

## 2021-07-12 DIAGNOSIS — E669 Obesity, unspecified: Secondary | ICD-10-CM | POA: Diagnosis present

## 2021-07-12 DIAGNOSIS — I4892 Unspecified atrial flutter: Secondary | ICD-10-CM | POA: Diagnosis not present

## 2021-07-12 DIAGNOSIS — Z7982 Long term (current) use of aspirin: Secondary | ICD-10-CM | POA: Diagnosis not present

## 2021-07-12 DIAGNOSIS — R079 Chest pain, unspecified: Secondary | ICD-10-CM | POA: Diagnosis not present

## 2021-07-12 DIAGNOSIS — I25110 Atherosclerotic heart disease of native coronary artery with unstable angina pectoris: Secondary | ICD-10-CM | POA: Diagnosis not present

## 2021-07-12 DIAGNOSIS — I2510 Atherosclerotic heart disease of native coronary artery without angina pectoris: Secondary | ICD-10-CM | POA: Diagnosis not present

## 2021-07-12 DIAGNOSIS — E8770 Fluid overload, unspecified: Secondary | ICD-10-CM | POA: Diagnosis present

## 2021-07-12 DIAGNOSIS — D696 Thrombocytopenia, unspecified: Secondary | ICD-10-CM | POA: Diagnosis not present

## 2021-07-12 DIAGNOSIS — J90 Pleural effusion, not elsewhere classified: Secondary | ICD-10-CM | POA: Diagnosis not present

## 2021-07-12 DIAGNOSIS — I340 Nonrheumatic mitral (valve) insufficiency: Secondary | ICD-10-CM | POA: Diagnosis not present

## 2021-07-12 DIAGNOSIS — E785 Hyperlipidemia, unspecified: Secondary | ICD-10-CM | POA: Diagnosis not present

## 2021-07-12 DIAGNOSIS — R197 Diarrhea, unspecified: Secondary | ICD-10-CM | POA: Diagnosis not present

## 2021-07-12 DIAGNOSIS — G629 Polyneuropathy, unspecified: Secondary | ICD-10-CM | POA: Diagnosis not present

## 2021-07-12 DIAGNOSIS — I4891 Unspecified atrial fibrillation: Secondary | ICD-10-CM | POA: Diagnosis not present

## 2021-07-12 DIAGNOSIS — Z87891 Personal history of nicotine dependence: Secondary | ICD-10-CM

## 2021-07-12 DIAGNOSIS — N179 Acute kidney failure, unspecified: Secondary | ICD-10-CM | POA: Diagnosis not present

## 2021-07-12 DIAGNOSIS — J9811 Atelectasis: Secondary | ICD-10-CM | POA: Diagnosis not present

## 2021-07-12 DIAGNOSIS — I480 Paroxysmal atrial fibrillation: Secondary | ICD-10-CM | POA: Diagnosis present

## 2021-07-12 DIAGNOSIS — Z01810 Encounter for preprocedural cardiovascular examination: Secondary | ICD-10-CM | POA: Diagnosis not present

## 2021-07-12 HISTORY — PX: LEFT HEART CATH AND CORONARY ANGIOGRAPHY: CATH118249

## 2021-07-12 LAB — CBC
HCT: 42 % (ref 39.0–52.0)
Hemoglobin: 13.6 g/dL (ref 13.0–17.0)
MCH: 30.4 pg (ref 26.0–34.0)
MCHC: 32.4 g/dL (ref 30.0–36.0)
MCV: 93.8 fL (ref 80.0–100.0)
Platelets: 183 10*3/uL (ref 150–400)
RBC: 4.48 MIL/uL (ref 4.22–5.81)
RDW: 12.4 % (ref 11.5–15.5)
WBC: 3.7 10*3/uL — ABNORMAL LOW (ref 4.0–10.5)
nRBC: 0 % (ref 0.0–0.2)

## 2021-07-12 LAB — GLUCOSE, CAPILLARY
Glucose-Capillary: 119 mg/dL — ABNORMAL HIGH (ref 70–99)
Glucose-Capillary: 124 mg/dL — ABNORMAL HIGH (ref 70–99)
Glucose-Capillary: 112 mg/dL — ABNORMAL HIGH (ref 70–99)

## 2021-07-12 SURGERY — LEFT HEART CATH AND CORONARY ANGIOGRAPHY
Anesthesia: LOCAL

## 2021-07-12 MED ORDER — SODIUM CHLORIDE 0.9 % WEIGHT BASED INFUSION: 1.0000 mL/kg/h | INTRAVENOUS | Status: DC

## 2021-07-12 MED ORDER — MIDAZOLAM HCL 2 MG/2ML IJ SOLN
INTRAMUSCULAR | Status: DC | PRN
  Administered 2021-07-12 (×2): 1 mg via INTRAVENOUS

## 2021-07-12 MED ORDER — SODIUM CHLORIDE 0.9% FLUSH
3.0000 mL | Freq: Two times a day (BID) | INTRAVENOUS | Status: DC
  Administered 2021-07-12 – 2021-07-15 (×7): 3 mL via INTRAVENOUS

## 2021-07-12 MED ORDER — LOSARTAN POTASSIUM 50 MG PO TABS
50.0000 mg | ORAL_TABLET | Freq: Every day | ORAL | Status: DC
  Administered 2021-07-13 – 2021-07-15 (×3): 50 mg via ORAL
  Filled 2021-07-12 (×3): qty 1

## 2021-07-12 MED ORDER — ASPIRIN 81 MG PO CHEW: 81.0000 mg | CHEWABLE_TABLET | ORAL | Status: DC

## 2021-07-12 MED ORDER — VERAPAMIL HCL 2.5 MG/ML IV SOLN
INTRAVENOUS | Status: DC | PRN
  Administered 2021-07-12: 10 mL via INTRA_ARTERIAL

## 2021-07-12 MED ORDER — MAGNESIUM OXIDE -MG SUPPLEMENT 400 (240 MG) MG PO TABS
400.0000 mg | ORAL_TABLET | Freq: Every day | ORAL | Status: DC
  Administered 2021-07-12 – 2021-07-15 (×4): 400 mg via ORAL
  Filled 2021-07-12 (×4): qty 1

## 2021-07-12 MED ORDER — ONDANSETRON HCL 4 MG/2ML IJ SOLN: 4.0000 mg | Freq: Four times a day (QID) | INTRAMUSCULAR | Status: DC | PRN

## 2021-07-12 MED ORDER — HEPARIN (PORCINE) IN NACL 1000-0.9 UT/500ML-% IV SOLN
INTRAVENOUS | Status: DC | PRN
  Administered 2021-07-12 (×2): 500 mL

## 2021-07-12 MED ORDER — METOPROLOL SUCCINATE ER 25 MG PO TB24
25.0000 mg | ORAL_TABLET | Freq: Two times a day (BID) | ORAL | Status: DC
  Administered 2021-07-12 – 2021-07-15 (×7): 25 mg via ORAL
  Filled 2021-07-12 (×7): qty 1

## 2021-07-12 MED ORDER — HEPARIN (PORCINE) IN NACL 1000-0.9 UT/500ML-% IV SOLN
INTRAVENOUS | Status: AC
  Filled 2021-07-12: qty 500

## 2021-07-12 MED ORDER — LIDOCAINE HCL (PF) 1 % IJ SOLN
INTRAMUSCULAR | Status: DC | PRN
  Administered 2021-07-12: 2 mL

## 2021-07-12 MED ORDER — SODIUM CHLORIDE 0.9% FLUSH
3.0000 mL | Freq: Two times a day (BID) | INTRAVENOUS | Status: DC
  Administered 2021-07-12 – 2021-07-14 (×4): 3 mL via INTRAVENOUS

## 2021-07-12 MED ORDER — HEPARIN SODIUM (PORCINE) 1000 UNIT/ML IJ SOLN
INTRAMUSCULAR | Status: AC
  Filled 2021-07-12: qty 1

## 2021-07-12 MED ORDER — SODIUM CHLORIDE 0.9 % IV SOLN: 250.0000 mL | INTRAVENOUS | Status: DC | PRN

## 2021-07-12 MED ORDER — LABETALOL HCL 5 MG/ML IV SOLN
10.0000 mg | INTRAVENOUS | Status: AC | PRN
Start: 2021-07-12 — End: 2021-07-12

## 2021-07-12 MED ORDER — SODIUM CHLORIDE 0.9% FLUSH: 3.0000 mL | INTRAVENOUS | Status: DC | PRN

## 2021-07-12 MED ORDER — HYDRALAZINE HCL 20 MG/ML IJ SOLN: 10.0000 mg | INTRAMUSCULAR | Status: AC | PRN

## 2021-07-12 MED ORDER — MAGNESIUM OXIDE 400 (241.3 MG) MG PO TABS
400.0000 mg | ORAL_TABLET | Freq: Every day | ORAL | Status: DC
  Filled 2021-07-12 (×2): qty 1

## 2021-07-12 MED ORDER — FENTANYL CITRATE (PF) 100 MCG/2ML IJ SOLN
INTRAMUSCULAR | Status: DC | PRN
  Administered 2021-07-12 (×2): 25 ug via INTRAVENOUS

## 2021-07-12 MED ORDER — AMLODIPINE BESYLATE 5 MG PO TABS
5.0000 mg | ORAL_TABLET | Freq: Every day | ORAL | Status: DC
  Administered 2021-07-13 – 2021-07-15 (×3): 5 mg via ORAL
  Filled 2021-07-12 (×3): qty 1

## 2021-07-12 MED ORDER — ATORVASTATIN CALCIUM 80 MG PO TABS
80.0000 mg | ORAL_TABLET | Freq: Every day | ORAL | Status: DC
  Administered 2021-07-12 – 2021-07-25 (×13): 80 mg via ORAL
  Filled 2021-07-12 (×13): qty 1

## 2021-07-12 MED ORDER — PANTOPRAZOLE SODIUM 40 MG PO TBEC
40.0000 mg | DELAYED_RELEASE_TABLET | Freq: Every day | ORAL | Status: DC
  Administered 2021-07-13 – 2021-07-15 (×3): 40 mg via ORAL
  Filled 2021-07-12 (×3): qty 1

## 2021-07-12 MED ORDER — NITROGLYCERIN 0.4 MG SL SUBL: 0.4000 mg | SUBLINGUAL_TABLET | SUBLINGUAL | Status: DC | PRN

## 2021-07-12 MED ORDER — VERAPAMIL HCL 2.5 MG/ML IV SOLN
INTRAVENOUS | Status: AC
  Filled 2021-07-12: qty 2

## 2021-07-12 MED ORDER — MIDAZOLAM HCL 2 MG/2ML IJ SOLN
INTRAMUSCULAR | Status: AC
  Filled 2021-07-12: qty 2

## 2021-07-12 MED ORDER — ASPIRIN EC 81 MG PO TBEC
81.0000 mg | DELAYED_RELEASE_TABLET | Freq: Every day | ORAL | Status: DC
  Administered 2021-07-13 – 2021-07-15 (×3): 81 mg via ORAL
  Filled 2021-07-12 (×3): qty 1

## 2021-07-12 MED ORDER — SODIUM CHLORIDE 0.9 % WEIGHT BASED INFUSION
3.0000 mL/kg/h | INTRAVENOUS | Status: DC
  Administered 2021-07-12: 3 mL/kg/h via INTRAVENOUS

## 2021-07-12 MED ORDER — FENTANYL CITRATE (PF) 100 MCG/2ML IJ SOLN
INTRAMUSCULAR | Status: AC
  Filled 2021-07-12: qty 2

## 2021-07-12 MED ORDER — COLCHICINE 0.6 MG PO TABS
0.6000 mg | ORAL_TABLET | Freq: Every day | ORAL | Status: DC
  Administered 2021-07-13 – 2021-07-25 (×12): 0.6 mg via ORAL
  Filled 2021-07-12 (×12): qty 1

## 2021-07-12 MED ORDER — IOHEXOL 350 MG/ML SOLN
INTRAVENOUS | Status: DC | PRN
  Administered 2021-07-12: 90 mL

## 2021-07-12 MED ORDER — LIDOCAINE HCL (PF) 1 % IJ SOLN
INTRAMUSCULAR | Status: AC
  Filled 2021-07-12: qty 30

## 2021-07-12 MED ORDER — ACETAMINOPHEN 325 MG PO TABS: 650.0000 mg | ORAL_TABLET | ORAL | Status: DC | PRN

## 2021-07-12 MED ORDER — SODIUM CHLORIDE 0.9 % IV SOLN: INTRAVENOUS | Status: AC

## 2021-07-12 MED ORDER — HEPARIN SODIUM (PORCINE) 1000 UNIT/ML IJ SOLN
INTRAMUSCULAR | Status: DC | PRN
  Administered 2021-07-12: 6000 [IU] via INTRAVENOUS

## 2021-07-12 SURGICAL SUPPLY — 8 items
CATH 5FR JL3.5 JR4 ANG PIG MP (CATHETERS) ×2 IMPLANT
GLIDESHEATH SLEND SS 6F .021 (SHEATH) ×1 IMPLANT
GUIDEWIRE INQWIRE 1.5J.035X260 (WIRE) IMPLANT
INQWIRE 1.5J .035X260CM (WIRE) ×4
KIT HEART LEFT (KITS) ×2 IMPLANT
PACK CARDIAC CATHETERIZATION (CUSTOM PROCEDURE TRAY) ×2 IMPLANT
TRANSDUCER W/STOPCOCK (MISCELLANEOUS) ×2 IMPLANT
TUBING CIL FLEX 10 FLL-RA (TUBING) ×2 IMPLANT

## 2021-07-12 NOTE — Progress Notes (Signed)
TCTS consulted for CABG evaluation. °

## 2021-07-12 NOTE — Interval H&P Note (Signed)
History and Physical Interval Note:  07/12/2021 9:24 AM  Oscar [name]  has presented today for surgery, with the diagnosis of unstable angina, abnormal cardiac CT.  The various methods of treatment have been discussed with the patient and family. After consideration of risks, benefits and other options for treatment, the patient has consented to  Procedure(s): LEFT HEART CATH AND CORONARY ANGIOGRAPHY (N/A) as a surgical intervention.  The patient's history has been reviewed, patient examined, no change in status, stable for surgery.  I have reviewed the patient's chart and labs.  Questions were answered to the patient's satisfaction.    Cath Lab Visit (complete for each Cath Lab visit)  Clinical Evaluation Leading to the Procedure:   ACS: No.  Non-ACS:    Anginal Classification: CCS III  Anti-ischemic medical therapy: Maximal Therapy (2 or more classes of medications)  Non-Invasive Test Results: Coronary CTA with suggestion of severe three vessel CAD  Prior CABG: No previous CABG        Oscar [name]

## 2021-07-12 NOTE — Consult Note (Addendum)
[address]       [place] [address]             [phone]        Darrly B Rebel [employer] Medical Record [id number] Date of Birth: 1946-07-22  Referring: No ref. provider found Primary Care: Sueanne Margarita, DO Primary Cardiologist:Kenneth Wells Guiles, MD  Chief Complaint:   Severe multivessel coronary artery disease   history of Present Illness:    We are asked to see this 75 year old male in cardiothoracic surgical consultation for consideration of coronary artery surgical revascularization.  The patient has multiple comorbidities and cardiac risk factors including moderate obesity, dyslipidemia, hypertension and type 2 diabetes mellitus.  He is also noted to have a strong family history of heart disease with his father passing from a myocardial infarction at age 98 and a brother deceased from myocardial infarction at age 82.  He is a remote smoker with  use for approximately 10 years.  He quit in 1975.  The patient describes a history of recent increase in palpitations and saw his cardiologist who did an EKG which revealed he was having PACs.  He was started on metoprolol 25 mg twice daily with notable improvement in his symptoms.  He noted improvement in both heart rate as well as blood pressure.  The patient initially denied  chest pain but does note he gets short of breath and diaphoretic very easily with minimal exertion.  This has shown progressive worsening recently.  He additionally now has some chest pressure symptoms.  It has been particularly true for the past approximate 2 weeks.  Additionally he notes recent increased fatigability. He did have Covid in July .  He has  history of cardiology evaluation in the early 2000's including catheterization in 2002 which at that time showed normal LV systolic function and three-vessel nonobstructive coronary disease.  He was treated medically and followed over the years.  He again was seen recently for these symptoms and EKG  revealed sinus rhythm with PACs and left anterior fascicular block.  There was voltage criteria for LVH and lateral T wave inversions concerning for ischemia.  A CT angiogram was performed on 06/28/2021 and the quality was felt to be suboptimal however his calcium score was significantly elevated at 2381 which was 28 percentile for age and sex matched controls.  Coronary arteries were felt to be diffusely diseased with severe stenosis in the right coronary artery.  LAD and circumflex arteries also showed significant disease.  Cardiac catheterization was recommended which he underwent on today's date.  He is found to have severe three-vessel coronary artery disease and the full report is below.  PFTs, echocardiogram and Doppler studies have been ordered and are pending.    Current Activity/ Functional Status: Patient is independent with mobility/ambulation, transfers, ADL's, IADL's.   Zubrod Score: At the time of surgery this patient's most appropriate activity status/level should be described as: '[]'$     0    Normal activity, no symptoms '[x]'$     1    Restricted in physical strenuous activity but ambulatory, able to do out light work '[]'$     2    Ambulatory and capable of self care, unable to do work activities, up and about                 more than 50%  Of the time                            '[]'$   3    Only limited self care, in bed greater than 50% of waking hours '[]'$     4    Completely disabled, no self care, confined to bed or chair '[]'$     5    Moribund  Past Medical History:  Diagnosis Date   Anesthesia complication    PER PT, "HARD TO WAKE' UP PAST SHOULDER SURGERY IN 2016   Arthritis    lumbar, spondylosis, DDD,stenosis    Atrial premature depolarization    Chronic right hip pain    and right leg pain   Coronary artery disease    Diabetes mellitus without complication ([place])    Dysrhythmia    pt. reports that he has been told in the past that he has  irreg. heattbeat    Edema    Gout     bil ankles   Hyperlipidemia    Hypertension    Rotator cuff tear    right   Shortness of breath    Swelling of extremity    bil feet   Type 2 diabetes mellitus with hyperglycemia [facility])     Past Surgical History:  Procedure Laterality Date   BACK SURGERY     January 2014   CARDIAC CATHETERIZATION     2004, outcome good, to be followed for/with medical therapy    ELBOW BURSA SURGERY     removed bursacs both elbows   FASCIECTOMY Left 12/12/2020   Procedure: FASCIECTOMY LEFT SMALL FINGER;  Surgeon: Daryll Brod, MD;  Location: [facility];  Service: Orthopedics;  Laterality: Left;   LEFT HEART CATH AND CORONARY ANGIOGRAPHY N/A 07/12/2021   Procedure: LEFT HEART CATH AND CORONARY ANGIOGRAPHY;  Surgeon: Burnell Blanks, MD;  Location: [facility];  Service: Cardiovascular;  Laterality: N/A;   SHOULDER ARTHROSCOPY WITH ROTATOR CUFF REPAIR AND SUBACROMIAL DECOMPRESSION Right 08/21/2015   Procedure: SHOULDER ARTHROSCOPY WITH ROTATOR CUFF REPAIR S UBACROMIAL DECOMPRESSION, ;  Surgeon: Tania Ade, MD;  Location: [facility];  Service: Orthopedics;  Laterality: Right;  Right shoulder arthroscopy rotator cuff repair vs debridement, subacromial decompression, possible biceps tenotomy.    Social History   Tobacco Use  Smoking Status Former   Packs/day: 0.30   Years: 10.00   Pack years: 3.00   Types: Cigarettes   Quit date: 11/04/1973   Years since quitting: 47.7  Smokeless Tobacco Never    Social History   Substance and Sexual Activity  Alcohol Use Not Currently   Comment: rare. wine or beer     Allergies  Allergen Reactions   Yellow Fever Vaccine Hives    Current Facility-Administered Medications  Medication Dose Route Frequency Provider Last Rate Last Admin   [START ON 07/13/2021] amLODipine (NORVASC) tablet 5 mg  5 mg Oral Daily Burnell Blanks, MD       [START ON 07/13/2021] aspirin EC tablet 81 mg  81 mg Oral Daily Burnell Blanks, MD       atorvastatin (LIPITOR) tablet 80 mg  80 mg Oral Daily Burnell Blanks, MD       [START ON 07/13/2021] colchicine tablet 0.6 mg  0.6 mg Oral Q breakfast Burnell Blanks, MD       [START ON 07/13/2021] losartan (COZAAR) tablet 50 mg  50 mg Oral QAC breakfast Burnell Blanks, MD       magnesium oxide (MAG-OX) tablet 400 mg  400 mg Oral Daily Burnell Blanks, MD       metoprolol  succinate (TOPROL-XL) 24 hr tablet 25 mg  25 mg Oral BID Burnell Blanks, MD       nitroGLYCERIN (NITROSTAT) SL tablet 0.4 mg  0.4 mg Sublingual Q5 min PRN Burnell Blanks, MD       [START ON 07/13/2021] pantoprazole (PROTONIX) EC tablet 40 mg  40 mg Oral Daily Burnell Blanks, MD       sodium chloride flush (NS) 0.9 % injection 3 mL  3 mL Intravenous Q12H Hilty, Nadean Corwin, MD       Facility-Administered Medications Ordered in Other Encounters  Medication Dose Route Frequency Provider Last Rate Last Admin   technetium tetrofosmin (TC-MYOVIEW) injection [id number] millicurie  [id number] millicurie Intravenous Once PRN Lelon Perla, MD        Medications Prior to Admission  Medication Sig Dispense Refill Last Dose   amLODipine (NORVASC) 5 MG tablet Take 5 mg by mouth daily.   07/12/2021 at 0730   aspirin 81 MG tablet Take 81 mg by mouth daily.   07/12/2021 at 0730   colchicine 0.6 MG tablet Take 0.6 mg by mouth daily with breakfast.    07/12/2021 at 0730   furosemide (LASIX) 20 MG tablet Take 20 mg by mouth daily as needed for edema.   07/11/2021   halobetasol (ULTRAVATE) 0.05 % cream Apply 1 application topically 2 (two) times daily as needed (psoriasis).   07/11/2021   hydrocortisone 2.5 % cream Apply topically 2 (two) times daily. (Patient taking differently: Apply 1 application topically 2 (two) times daily as needed (psoriasis).) 30 g 4 07/11/2021   losartan (COZAAR) 50 MG tablet Take 50 mg by mouth daily before breakfast.    07/12/2021 at 0730   Magnesium 250 MG  TABS Take 500 mg by mouth daily.   07/11/2021   metFORMIN (GLUCOPHAGE) 500 MG tablet Take 500-1,000 mg by mouth See admin instructions. 500 mg in the morning, 1000 mg in the evening   07/11/2021 at 2000   metoprolol succinate (TOPROL-XL) 25 MG 24 hr tablet Take 25 mg by mouth 2 (two) times daily.   07/12/2021 at 0730   nabumetone (RELAFEN) 500 MG tablet Take 500 mg by mouth 2 (two) times daily as needed for mild pain.   Past Week   nitroGLYCERIN (NITROSTAT) 0.4 MG SL tablet Place 1 tablet (0.4 mg total) under the tongue every 5 (five) minutes as needed. Take up to 3 doses in 15 minutes. 25 tablet 3 never   omeprazole (PRILOSEC OTC) 20 MG tablet Take 20 mg by mouth daily.   Past Month   pravastatin (PRAVACHOL) 40 MG tablet Take 40 mg by mouth daily with breakfast.    07/12/2021 at 0730   traMADol (ULTRAM) 50 MG tablet Take 1 tablet by mouth 2 (two) times daily as needed.   Past Week   vitamin B-12 (CYANOCOBALAMIN) 500 MCG tablet Take 500 mcg by mouth daily.   07/12/2021 at 2000   sildenafil (VIAGRA) 100 MG tablet Take 100 mg by mouth as needed for erectile dysfunction.   More than a month    Family History  Problem Relation Age of Onset   Heart disease Father    Diabetes Father    Heart disease Brother    Diabetes Paternal Grandmother    Colon cancer Neg Hx    Esophageal cancer Neg Hx    Rectal cancer Neg Hx    Stomach cancer Neg Hx      Review of Systems:   Review of Systems  Constitutional:  Positive for diaphoresis, malaise/fatigue and weight loss. Negative for chills and fever.  HENT:  Positive for hearing loss and tinnitus. Negative for congestion, ear discharge, ear pain, nosebleeds, sinus pain and sore throat.   Eyes:  Negative for blurred vision, double vision, photophobia, pain, discharge and redness.       Some visual changes right eye, like " a fan spinning"- intermit  Respiratory:  Positive for shortness of breath. Negative for cough, hemoptysis, sputum production, wheezing and  stridor.        Had Covid in July with cough  Cardiovascular:  Positive for chest pain and palpitations. Negative for orthopnea, claudication, leg swelling and PND.  Gastrointestinal:  Negative for abdominal pain, blood in stool, constipation, diarrhea, heartburn, melena, nausea and vomiting.  Genitourinary: Negative.   Musculoskeletal:  Positive for back pain. Negative for falls, joint pain, myalgias and neck pain.  Skin: Negative.   Neurological:  Positive for sensory change. Negative for dizziness, tingling, tremors, speech change, focal weakness, seizures, loss of consciousness, weakness and headaches.       Feet get cold at night and fingers are numb sometimes  Endo/Heme/Allergies:  Negative for environmental allergies and polydipsia. Does not bruise/bleed easily.  Psychiatric/Behavioral:  Negative for depression, hallucinations, memory loss, substance abuse and suicidal ideas. The patient is not nervous/anxious and does not have insomnia.         Physical Exam: BP (!) 143/73 (BP Location: Left Arm)   Pulse 63   Temp 98.5 F (36.9 C) (Oral)   Resp 16   Ht 6' (1.829 m)   Wt 120.1 kg   SpO2 94%   BMI 35.91 kg/m    Physical Exam  Constitutional: No distress.  HENT:  Nose: No nasal discharge.  Mouth/Throat: Dental caries present. Oropharynx is clear. Pharynx is normal.  Fair dentition with missing teeth and caries  Eyes: Pupils are equal, round, and reactive to light. Conjunctivae are normal.  + arcus senilis  Neck: Thyroid normal. No JVD present. No neck adenopathy. No thyromegaly present.  Cardiovascular: Regular rhythm, S1 normal, S2 normal and normal heart sounds. Exam reveals no gallop.  No murmur heard. Pulses:      Radial pulses are 1+ on the right side and 1+ on the left side.       Femoral pulses are 1+ on the right side and 1+ on the left side.      Dorsalis pedis pulses are 1+ on the right side and 0 on the left side.       Posterior tibial pulses are 1+ on the  right side and 0 on the left side.  No carotid bruits  Pulmonary/Chest: Breath sounds normal. He has no wheezes. He has no rales. He exhibits no tenderness.  Abdominal: Soft. Bowel sounds are normal. He exhibits no distension and no mass. There is no hepatomegaly. There is no abdominal tenderness.  Musculoskeletal:        General: No tenderness or deformity.     Cervical back: Normal range of motion and neck supple.     Comments: + ankle edema  Neurological: He is alert and oriented to person, place, and time. He has normal motor skills.  Skin: Skin is warm and dry. No rash noted. No cyanosis. No jaundice, pallor or plethora. Nails show no clubbing.    Diagnostic Studies & Laboratory data:     Recent Radiology Findings:   CARDIAC CATHETERIZATION  Result Date: 07/12/2021   Prox RCA lesion is 50%  stenosed.   Dist RCA lesion is 80% stenosed.   Prox Cx lesion is 50% stenosed.   1st Mrg lesion is 70% stenosed.   2nd Mrg lesion is 80% stenosed.   Mid Cx to Dist Cx lesion is 90% stenosed.   Prox LAD to Mid LAD lesion is 75% stenosed.   Mid LAD lesion is 70% stenosed.   Dist LAD lesion is 99% stenosed.   1st Diag lesion is 50% stenosed.   The left ventricular systolic function is normal.   LV end diastolic pressure is mildly elevated.   The left ventricular ejection fraction is 55-65% by visual estimate.   There is no mitral valve regurgitation. Severe three vessel CAD The LAD is a large caliber vessel that courses to the apex giving off one large diagonal branch. The mid LAD has a severe stenosis just after the takeoff of the Diagonal branch. There is diffuse disease in the mid LAD beyond this stenosis. Severe distal LAD stenosis. The Diagonal branch has moderate non-obstructive disease. The Circumflex gives off several moderate caliber obtuse marginal branches. The first OM branch has moderately severe proximal stenosis. The second obtuse marginal branch has moderately severe proximal stenosis. The AV  groove Circumflex has severe stenosis in the mid segment beyond the takeoff of OM2. The RCA is a large, calcified vessel with an aneurysmal mid segment and heavily calcified, severe distal stenosis. Normal LV systolic function Recommendations: Will consult CT surgery for CABG in this diabetic patient with severe, multi-vessel CAD. Continue ASA, statin and beta blocker. Echo today. Admit to telemetry.    Diagnostic Dominance: Right  I have independently reviewed the above radiologic studies and discussed with the patient   Recent Lab Findings: Lab Results  Component Value Date   WBC 3.7 (L) 07/12/2021   HGB 13.6 07/12/2021   HCT 42.0 07/12/2021   PLT 183 07/12/2021   GLUCOSE 84 06/22/2021   NA 143 06/22/2021   K 4.1 06/22/2021   CL 105 06/22/2021   CREATININE 1.17 06/22/2021   BUN 12 06/22/2021   CO2 22 06/22/2021      Assessment / Plan: Unstable angina Severe three-vessel coronary artery disease, abnormal EKG with T wave inversions normal LVEF by echo in 2017, echo has been ordered but not completed yet Palpitations with PACs Type 2 diabetes Dyslipidemia Hypertension Arthritis Chronic right hip pain History of gout Right rotator cuff tear Anesthesia complication per patient "hard to wake up following shoulder surgery  The patient and all relevant studies will be reviewed by the surgeon to consider candidacy for CABG.   I  spent 55 minutes counseling the patient face to face.   John Giovanni, PA-C  07/12/2021 2:43 PM  Patient seen and examined, agree with above. 75 yo man with multiple CRf and known CAD presents with accelerating angina. Cath shows severe 3 vessel CAD. CABG indicated for survival benefit and relief of symptoms.  I discussed the general nature of the procedure, including the need for general anesthesia, the incisions to be used, the use of cardiopulmonary bypass, and the use of drainage tubes and pacing wires postoperatively with Mr and Mrs Lichtenwalner. We  discussed the expected hospital stay, overall recovery and short and long term outcomes. I informed them of the indications, risks, benefits and alternatives.  They understand the risks include, but are not limited to death, stroke, MI, DVT/PE, bleeding, possible need for transfusion, infections, cardiac arrhythmias, as well as other organ system dysfunction including respiratory, renal, or GI  complications.   He wishes to think about his options before making a decision.  If he decides to proceed would plan OR Monday 9/12  Remo Lipps C. Roxan Hockey, MD [facility] [phone]

## 2021-07-12 NOTE — H&P (View-Only) (Signed)
[address]       [place] [address]             [phone]        Elier B Hulgan [facility] Medical Record [id number] Date of Birth: October 14, 1946  Referring: No ref. provider found Primary Care: Sueanne Margarita, DO Primary Cardiologist:Kenneth Wells Guiles, MD  Chief Complaint:   Severe multivessel coronary artery disease   history of Present Illness:    We are asked to see this 75 year old male in cardiothoracic surgical consultation for consideration of coronary artery surgical revascularization.  The patient has multiple comorbidities and cardiac risk factors including moderate obesity, dyslipidemia, hypertension and type 2 diabetes mellitus.  He is also noted to have a strong family history of heart disease with his father passing from a myocardial infarction at age 71 and a brother deceased from myocardial infarction at age 34.  He is a remote smoker with  use for approximately 10 years.  He quit in 1975.  The patient describes a history of recent increase in palpitations and saw his cardiologist who did an EKG which revealed he was having PACs.  He was started on metoprolol 25 mg twice daily with notable improvement in his symptoms.  He noted improvement in both heart rate as well as blood pressure.  The patient initially denied  chest pain but does note he gets short of breath and diaphoretic very easily with minimal exertion.  This has shown progressive worsening recently.  He additionally now has some chest pressure symptoms.  It has been particularly true for the past approximate 2 weeks.  Additionally he notes recent increased fatigability. He did have Covid in July .  He has  history of cardiology evaluation in the early 2000's including catheterization in 2002 which at that time showed normal LV systolic function and three-vessel nonobstructive coronary disease.  He was treated medically and followed over the years.  He again was seen recently for these symptoms and EKG  revealed sinus rhythm with PACs and left anterior fascicular block.  There was voltage criteria for LVH and lateral T wave inversions concerning for ischemia.  A CT angiogram was performed on 06/28/2021 and the quality was felt to be suboptimal however his calcium score was significantly elevated at 2381 which was 32 percentile for age and sex matched controls.  Coronary arteries were felt to be diffusely diseased with severe stenosis in the right coronary artery.  LAD and circumflex arteries also showed significant disease.  Cardiac catheterization was recommended which he underwent on today's date.  He is found to have severe three-vessel coronary artery disease and the full report is below.  PFTs, echocardiogram and Doppler studies have been ordered and are pending.    Current Activity/ Functional Status: Patient is independent with mobility/ambulation, transfers, ADL's, IADL's.   Zubrod Score: At the time of surgery this patient's most appropriate activity status/level should be described as: '[]'$     0    Normal activity, no symptoms '[x]'$     1    Restricted in physical strenuous activity but ambulatory, able to do out light work '[]'$     2    Ambulatory and capable of self care, unable to do work activities, up and about                 more than 50%  Of the time                            '[]'$   3    Only limited self care, in bed greater than 50% of waking hours '[]'$     4    Completely disabled, no self care, confined to bed or chair '[]'$     5    Moribund  Past Medical History:  Diagnosis Date   Anesthesia complication    PER PT, "HARD TO WAKE' UP PAST SHOULDER SURGERY IN 2016   Arthritis    lumbar, spondylosis, DDD,stenosis    Atrial premature depolarization    Chronic right hip pain    and right leg pain   Coronary artery disease    Diabetes mellitus without complication ([place])    Dysrhythmia    pt. reports that he has been told in the past that he has  irreg. heattbeat    Edema    Gout     bil ankles   Hyperlipidemia    Hypertension    Rotator cuff tear    right   Shortness of breath    Swelling of extremity    bil feet   Type 2 diabetes mellitus with hyperglycemia [facility])     Past Surgical History:  Procedure Laterality Date   BACK SURGERY     January 2014   CARDIAC CATHETERIZATION     2004, outcome good, to be followed for/with medical therapy    ELBOW BURSA SURGERY     removed bursacs both elbows   FASCIECTOMY Left 12/12/2020   Procedure: FASCIECTOMY LEFT SMALL FINGER;  Surgeon: Daryll Brod, MD;  Location: [facility];  Service: Orthopedics;  Laterality: Left;   LEFT HEART CATH AND CORONARY ANGIOGRAPHY N/A 07/12/2021   Procedure: LEFT HEART CATH AND CORONARY ANGIOGRAPHY;  Surgeon: Burnell Blanks, MD;  Location: [place];  Service: Cardiovascular;  Laterality: N/A;   SHOULDER ARTHROSCOPY WITH ROTATOR CUFF REPAIR AND SUBACROMIAL DECOMPRESSION Right 08/21/2015   Procedure: SHOULDER ARTHROSCOPY WITH ROTATOR CUFF REPAIR S UBACROMIAL DECOMPRESSION, ;  Surgeon: Tania Ade, MD;  Location: [facility];  Service: Orthopedics;  Laterality: Right;  Right shoulder arthroscopy rotator cuff repair vs debridement, subacromial decompression, possible biceps tenotomy.    Social History   Tobacco Use  Smoking Status Former   Packs/day: 0.30   Years: 10.00   Pack years: 3.00   Types: Cigarettes   Quit date: 11/04/1973   Years since quitting: 47.7  Smokeless Tobacco Never    Social History   Substance and Sexual Activity  Alcohol Use Not Currently   Comment: rare. wine or beer     Allergies  Allergen Reactions   Yellow Fever Vaccine Hives    Current Facility-Administered Medications  Medication Dose Route Frequency Provider Last Rate Last Admin   [START ON 07/13/2021] amLODipine (NORVASC) tablet 5 mg  5 mg Oral Daily Burnell Blanks, MD       [START ON 07/13/2021] aspirin EC tablet 81 mg  81 mg Oral Daily Burnell Blanks, MD       atorvastatin (LIPITOR) tablet 80 mg  80 mg Oral Daily Burnell Blanks, MD       [START ON 07/13/2021] colchicine tablet 0.6 mg  0.6 mg Oral Q breakfast Burnell Blanks, MD       [START ON 07/13/2021] losartan (COZAAR) tablet 50 mg  50 mg Oral QAC breakfast Burnell Blanks, MD       magnesium oxide (MAG-OX) tablet 400 mg  400 mg Oral Daily Burnell Blanks, MD       metoprolol  succinate (TOPROL-XL) 24 hr tablet 25 mg  25 mg Oral BID Burnell Blanks, MD       nitroGLYCERIN (NITROSTAT) SL tablet 0.4 mg  0.4 mg Sublingual Q5 min PRN Burnell Blanks, MD       [START ON 07/13/2021] pantoprazole (PROTONIX) EC tablet 40 mg  40 mg Oral Daily Burnell Blanks, MD       sodium chloride flush (NS) 0.9 % injection 3 mL  3 mL Intravenous Q12H Hilty, Nadean Corwin, MD       Facility-Administered Medications Ordered in Other Encounters  Medication Dose Route Frequency Provider Last Rate Last Admin   technetium tetrofosmin (TC-MYOVIEW) injection [id number] millicurie  [id number] millicurie Intravenous Once PRN Lelon Perla, MD        Medications Prior to Admission  Medication Sig Dispense Refill Last Dose   amLODipine (NORVASC) 5 MG tablet Take 5 mg by mouth daily.   07/12/2021 at 0730   aspirin 81 MG tablet Take 81 mg by mouth daily.   07/12/2021 at 0730   colchicine 0.6 MG tablet Take 0.6 mg by mouth daily with breakfast.    07/12/2021 at 0730   furosemide (LASIX) 20 MG tablet Take 20 mg by mouth daily as needed for edema.   07/11/2021   halobetasol (ULTRAVATE) 0.05 % cream Apply 1 application topically 2 (two) times daily as needed (psoriasis).   07/11/2021   hydrocortisone 2.5 % cream Apply topically 2 (two) times daily. (Patient taking differently: Apply 1 application topically 2 (two) times daily as needed (psoriasis).) 30 g 4 07/11/2021   losartan (COZAAR) 50 MG tablet Take 50 mg by mouth daily before breakfast.    07/12/2021 at 0730   Magnesium 250 MG  TABS Take 500 mg by mouth daily.   07/11/2021   metFORMIN (GLUCOPHAGE) 500 MG tablet Take 500-1,000 mg by mouth See admin instructions. 500 mg in the morning, 1000 mg in the evening   07/11/2021 at 2000   metoprolol succinate (TOPROL-XL) 25 MG 24 hr tablet Take 25 mg by mouth 2 (two) times daily.   07/12/2021 at 0730   nabumetone (RELAFEN) 500 MG tablet Take 500 mg by mouth 2 (two) times daily as needed for mild pain.   Past Week   nitroGLYCERIN (NITROSTAT) 0.4 MG SL tablet Place 1 tablet (0.4 mg total) under the tongue every 5 (five) minutes as needed. Take up to 3 doses in 15 minutes. 25 tablet 3 never   omeprazole (PRILOSEC OTC) 20 MG tablet Take 20 mg by mouth daily.   Past Month   pravastatin (PRAVACHOL) 40 MG tablet Take 40 mg by mouth daily with breakfast.    07/12/2021 at 0730   traMADol (ULTRAM) 50 MG tablet Take 1 tablet by mouth 2 (two) times daily as needed.   Past Week   vitamin B-12 (CYANOCOBALAMIN) 500 MCG tablet Take 500 mcg by mouth daily.   07/12/2021 at 2000   sildenafil (VIAGRA) 100 MG tablet Take 100 mg by mouth as needed for erectile dysfunction.   More than a month    Family History  Problem Relation Age of Onset   Heart disease Father    Diabetes Father    Heart disease Brother    Diabetes Paternal Grandmother    Colon cancer Neg Hx    Esophageal cancer Neg Hx    Rectal cancer Neg Hx    Stomach cancer Neg Hx      Review of Systems:   Review of Systems  Constitutional:  Positive for diaphoresis, malaise/fatigue and weight loss. Negative for chills and fever.  HENT:  Positive for hearing loss and tinnitus. Negative for congestion, ear discharge, ear pain, nosebleeds, sinus pain and sore throat.   Eyes:  Negative for blurred vision, double vision, photophobia, pain, discharge and redness.       Some visual changes right eye, like " a fan spinning"- intermit  Respiratory:  Positive for shortness of breath. Negative for cough, hemoptysis, sputum production, wheezing and  stridor.        Had Covid in July with cough  Cardiovascular:  Positive for chest pain and palpitations. Negative for orthopnea, claudication, leg swelling and PND.  Gastrointestinal:  Negative for abdominal pain, blood in stool, constipation, diarrhea, heartburn, melena, nausea and vomiting.  Genitourinary: Negative.   Musculoskeletal:  Positive for back pain. Negative for falls, joint pain, myalgias and neck pain.  Skin: Negative.   Neurological:  Positive for sensory change. Negative for dizziness, tingling, tremors, speech change, focal weakness, seizures, loss of consciousness, weakness and headaches.       Feet get cold at night and fingers are numb sometimes  Endo/Heme/Allergies:  Negative for environmental allergies and polydipsia. Does not bruise/bleed easily.  Psychiatric/Behavioral:  Negative for depression, hallucinations, memory loss, substance abuse and suicidal ideas. The patient is not nervous/anxious and does not have insomnia.         Physical Exam: BP (!) 143/73 (BP Location: Left Arm)   Pulse 63   Temp 98.5 F (36.9 C) (Oral)   Resp 16   Ht 6' (1.829 m)   Wt 120.1 kg   SpO2 94%   BMI 35.91 kg/m    Physical Exam  Constitutional: No distress.  HENT:  Nose: No nasal discharge.  Mouth/Throat: Dental caries present. Oropharynx is clear. Pharynx is normal.  Fair dentition with missing teeth and caries  Eyes: Pupils are equal, round, and reactive to light. Conjunctivae are normal.  + arcus senilis  Neck: Thyroid normal. No JVD present. No neck adenopathy. No thyromegaly present.  Cardiovascular: Regular rhythm, S1 normal, S2 normal and normal heart sounds. Exam reveals no gallop.  No murmur heard. Pulses:      Radial pulses are 1+ on the right side and 1+ on the left side.       Femoral pulses are 1+ on the right side and 1+ on the left side.      Dorsalis pedis pulses are 1+ on the right side and 0 on the left side.       Posterior tibial pulses are 1+ on the  right side and 0 on the left side.  No carotid bruits  Pulmonary/Chest: Breath sounds normal. He has no wheezes. He has no rales. He exhibits no tenderness.  Abdominal: Soft. Bowel sounds are normal. He exhibits no distension and no mass. There is no hepatomegaly. There is no abdominal tenderness.  Musculoskeletal:        General: No tenderness or deformity.     Cervical back: Normal range of motion and neck supple.     Comments: + ankle edema  Neurological: He is alert and oriented to person, place, and time. He has normal motor skills.  Skin: Skin is warm and dry. No rash noted. No cyanosis. No jaundice, pallor or plethora. Nails show no clubbing.    Diagnostic Studies & Laboratory data:     Recent Radiology Findings:   CARDIAC CATHETERIZATION  Result Date: 07/12/2021   Prox RCA lesion is 50%  stenosed.   Dist RCA lesion is 80% stenosed.   Prox Cx lesion is 50% stenosed.   1st Mrg lesion is 70% stenosed.   2nd Mrg lesion is 80% stenosed.   Mid Cx to Dist Cx lesion is 90% stenosed.   Prox LAD to Mid LAD lesion is 75% stenosed.   Mid LAD lesion is 70% stenosed.   Dist LAD lesion is 99% stenosed.   1st Diag lesion is 50% stenosed.   The left ventricular systolic function is normal.   LV end diastolic pressure is mildly elevated.   The left ventricular ejection fraction is 55-65% by visual estimate.   There is no mitral valve regurgitation. Severe three vessel CAD The LAD is a large caliber vessel that courses to the apex giving off one large diagonal branch. The mid LAD has a severe stenosis just after the takeoff of the Diagonal branch. There is diffuse disease in the mid LAD beyond this stenosis. Severe distal LAD stenosis. The Diagonal branch has moderate non-obstructive disease. The Circumflex gives off several moderate caliber obtuse marginal branches. The first OM branch has moderately severe proximal stenosis. The second obtuse marginal branch has moderately severe proximal stenosis. The AV  groove Circumflex has severe stenosis in the mid segment beyond the takeoff of OM2. The RCA is a large, calcified vessel with an aneurysmal mid segment and heavily calcified, severe distal stenosis. Normal LV systolic function Recommendations: Will consult CT surgery for CABG in this diabetic patient with severe, multi-vessel CAD. Continue ASA, statin and beta blocker. Echo today. Admit to telemetry.    Diagnostic Dominance: Right  I have independently reviewed the above radiologic studies and discussed with the patient   Recent Lab Findings: Lab Results  Component Value Date   WBC 3.7 (L) 07/12/2021   HGB 13.6 07/12/2021   HCT 42.0 07/12/2021   PLT 183 07/12/2021   GLUCOSE 84 06/22/2021   NA 143 06/22/2021   K 4.1 06/22/2021   CL 105 06/22/2021   CREATININE 1.17 06/22/2021   BUN 12 06/22/2021   CO2 22 06/22/2021      Assessment / Plan: Unstable angina Severe three-vessel coronary artery disease, abnormal EKG with T wave inversions normal LVEF by echo in 2017, echo has been ordered but not completed yet Palpitations with PACs Type 2 diabetes Dyslipidemia Hypertension Arthritis Chronic right hip pain History of gout Right rotator cuff tear Anesthesia complication per patient "hard to wake up following shoulder surgery  The patient and all relevant studies will be reviewed by the surgeon to consider candidacy for CABG.   I  spent 55 minutes counseling the patient face to face.   John Giovanni, PA-C  07/12/2021 2:43 PM  Patient seen and examined, agree with above. 76 yo man with multiple CRf and known CAD presents with accelerating angina. Cath shows severe 3 vessel CAD. CABG indicated for survival benefit and relief of symptoms.  I discussed the general nature of the procedure, including the need for general anesthesia, the incisions to be used, the use of cardiopulmonary bypass, and the use of drainage tubes and pacing wires postoperatively with Mr and Mrs Glander. We  discussed the expected hospital stay, overall recovery and short and long term outcomes. I informed them of the indications, risks, benefits and alternatives.  They understand the risks include, but are not limited to death, stroke, MI, DVT/PE, bleeding, possible need for transfusion, infections, cardiac arrhythmias, as well as other organ system dysfunction including respiratory, renal, or GI  complications.   He wishes to think about his options before making a decision.  If he decides to proceed would plan OR Monday 9/12  Remo Lipps C. Roxan Hockey, MD [facility] [phone]

## 2021-07-12 NOTE — Plan of Care (Signed)

## 2021-07-13 ENCOUNTER — Inpatient Hospital Stay (HOSPITAL_COMMUNITY): Payer: Medicare PPO

## 2021-07-13 ENCOUNTER — Encounter (HOSPITAL_COMMUNITY): Payer: Medicare PPO

## 2021-07-13 DIAGNOSIS — I2510 Atherosclerotic heart disease of native coronary artery without angina pectoris: Secondary | ICD-10-CM

## 2021-07-13 DIAGNOSIS — I200 Unstable angina: Secondary | ICD-10-CM | POA: Diagnosis not present

## 2021-07-13 DIAGNOSIS — Z01810 Encounter for preprocedural cardiovascular examination: Secondary | ICD-10-CM | POA: Diagnosis not present

## 2021-07-13 LAB — BLOOD GAS, ARTERIAL
Acid-base deficit: 1.7 mmol/L (ref 0.0–2.0)
Bicarbonate: 22.5 mmol/L (ref 20.0–28.0)
Drawn by: 441371
FIO2: 21
O2 Saturation: 95.3 %
Patient temperature: 37.3
pCO2 arterial: 38.3 mmHg (ref 32.0–48.0)
pH, Arterial: 7.388 (ref 7.350–7.450)
pO2, Arterial: 80 mmHg — ABNORMAL LOW (ref 83.0–108.0)

## 2021-07-13 LAB — URINALYSIS, ROUTINE W REFLEX MICROSCOPIC
Bilirubin Urine: NEGATIVE
Glucose, UA: NEGATIVE mg/dL
Hgb urine dipstick: NEGATIVE
Ketones, ur: NEGATIVE mg/dL
Leukocytes,Ua: NEGATIVE
Nitrite: NEGATIVE
Protein, ur: NEGATIVE mg/dL
Specific Gravity, Urine: 1.02 (ref 1.005–1.030)
pH: 6 (ref 5.0–8.0)

## 2021-07-13 LAB — ECHOCARDIOGRAM COMPLETE
AR max vel: 3.49 cm2
AV Area VTI: 3.84 cm2
AV Area mean vel: 3.09 cm2
AV Mean grad: 3 mmHg
AV Peak grad: 5.9 mmHg
Ao pk vel: 1.21 m/s
Area-P 1/2: 2.2 cm2
Height: 72 in
S' Lateral: 2.8 cm
Single Plane A4C EF: 59.8 %
Weight: 4225.6 oz

## 2021-07-13 LAB — PULMONARY FUNCTION TEST
FEF 25-75 Pre: 1.6 L/sec
FEF2575-%Pred-Pre: 64 %
FEV1-%Pred-Pre: 103 %
FEV1-Pre: 3.13 L
FEV1FVC-%Pred-Pre: 91 %
FEV6-%Pred-Pre: 108 %
FEV6-Pre: 4.19 L
FEV6FVC-%Pred-Pre: 97 %
FVC-%Pred-Pre: 112 %
FVC-Pre: 4.54 L
Pre FEV1/FVC ratio: 69 %
Pre FEV6/FVC Ratio: 92 %

## 2021-07-13 LAB — GLUCOSE, CAPILLARY
Glucose-Capillary: 105 mg/dL — ABNORMAL HIGH (ref 70–99)
Glucose-Capillary: 98 mg/dL (ref 70–99)
Glucose-Capillary: 102 mg/dL — ABNORMAL HIGH (ref 70–99)
Glucose-Capillary: 97 mg/dL (ref 70–99)

## 2021-07-13 NOTE — Progress Notes (Signed)
Pre-CABG exams have been completed.   Results can be found under chart review under CV PROC. 07/13/2021 1:06 PM Patric Vanpelt RVT, RDMS

## 2021-07-13 NOTE — Progress Notes (Signed)
Progress Note  Patient Name: Oscar [name] Date of Encounter: 07/13/2021  [facility] Cardiologist: Pixie Casino, MD   Subjective   Denies any chest pain, tightness or dyspnea  Inpatient Medications    Scheduled Meds:  amLODipine  5 mg Oral Daily   aspirin EC  81 mg Oral Daily   atorvastatin  80 mg Oral Daily   colchicine  0.6 mg Oral Q breakfast   losartan  50 mg Oral QAC breakfast   magnesium oxide  400 mg Oral Daily   metoprolol succinate  25 mg Oral BID   pantoprazole  40 mg Oral Daily   sodium chloride flush  3 mL Intravenous Q12H   sodium chloride flush  3 mL Intravenous Q12H   Continuous Infusions:  sodium chloride     PRN Meds: sodium chloride, acetaminophen, nitroGLYCERIN, ondansetron (ZOFRAN) IV, sodium chloride flush   Vital Signs    Vitals:   07/12/21 2020 07/13/21 0000 07/13/21 0403 07/13/21 0803  BP: 124/79 140/70 131/82 138/89  Pulse: 62 60 (!) 58 60  Resp: (!) '25 20 18   '$ Temp: 98 F (36.7 C) 98.2 F (36.8 C) 97.8 F (36.6 C) (!) 97.1 F (36.2 C)  TempSrc: Oral Oral Oral Oral  SpO2: 100% 100% 96% 100%  Weight:   119.8 kg   Height:        Intake/Output Summary (Last 24 hours) at 07/13/2021 0951 Last data filed at 07/13/2021 0835 Gross per 24 hour  Intake 1730.09 ml  Output 1850 ml  Net -119.91 ml   Last 3 Weights 07/13/2021 07/12/2021 07/12/2021  Weight (lbs) 264 lb 1.6 oz 264 lb 12.4 oz 260 lb  Weight (kg) 119.795 kg 120.1 kg 117.935 kg      Telemetry    NSR - Personally Reviewed  ECG    none - Personally Reviewed  Physical Exam   GEN: No acute distress.   Neck: No JVD Cardiac: RRR, no murmurs, rubs, or gallops.  Respiratory: Clear to auscultation bilaterally. GI: Soft, nontender, non-distended  MS: No edema; No deformity. No radial site hematoma Neuro:  Nonfocal  Psych: Normal affect   Labs    High Sensitivity Troponin:  No results for input(s): TROPONINIHS in the last 720 hours.    ChemistryNo results for input(s): NA,  K, CL, CO2, GLUCOSE, BUN, CREATININE, CALCIUM, PROT, ALBUMIN, AST, ALT, ALKPHOS, BILITOT, GFRNONAA, GFRAA, ANIONGAP in the last 168 hours.   Hematology Recent Labs  Lab 07/12/21 0841  WBC 3.7*  RBC 4.48  HGB 13.6  HCT 42.0  MCV 93.8  MCH 30.4  MCHC 32.4  RDW 12.4  PLT 183    BNPNo results for input(s): BNP, PROBNP in the last 168 hours.   DDimer No results for input(s): DDIMER in the last 168 hours.   Radiology    CARDIAC CATHETERIZATION  Result Date: 07/12/2021   Prox RCA lesion is 50% stenosed.   Dist RCA lesion is 80% stenosed.   Prox Cx lesion is 50% stenosed.   1st Mrg lesion is 70% stenosed.   2nd Mrg lesion is 80% stenosed.   Mid Cx to Dist Cx lesion is 90% stenosed.   Prox LAD to Mid LAD lesion is 75% stenosed.   Mid LAD lesion is 70% stenosed.   Dist LAD lesion is 99% stenosed.   1st Diag lesion is 50% stenosed.   The left ventricular systolic function is normal.   LV end diastolic pressure is mildly elevated.   The left  ventricular ejection fraction is 55-65% by visual estimate.   There is no mitral valve regurgitation. Severe three vessel CAD The LAD is a large caliber vessel that courses to the apex giving off one large diagonal branch. The mid LAD has a severe stenosis just after the takeoff of the Diagonal branch. There is diffuse disease in the mid LAD beyond this stenosis. Severe distal LAD stenosis. The Diagonal branch has moderate non-obstructive disease. The Circumflex gives off several moderate caliber obtuse marginal branches. The first OM branch has moderately severe proximal stenosis. The second obtuse marginal branch has moderately severe proximal stenosis. The AV groove Circumflex has severe stenosis in the mid segment beyond the takeoff of OM2. The RCA is a large, calcified vessel with an aneurysmal mid segment and heavily calcified, severe distal stenosis. Normal LV systolic function Recommendations: Will consult CT surgery for CABG in this diabetic patient with  severe, multi-vessel CAD. Continue ASA, statin and beta blocker. Echo today. Admit to telemetry.    Cardiac Studies   Procedures  LEFT HEART CATH AND CORONARY ANGIOGRAPHY   Conclusion      Prox RCA lesion is 50% stenosed.   Dist RCA lesion is 80% stenosed.   Prox Cx lesion is 50% stenosed.   1st Mrg lesion is 70% stenosed.   2nd Mrg lesion is 80% stenosed.   Mid Cx to Dist Cx lesion is 90% stenosed.   Prox LAD to Mid LAD lesion is 75% stenosed.   Mid LAD lesion is 70% stenosed.   Dist LAD lesion is 99% stenosed.   1st Diag lesion is 50% stenosed.   The left ventricular systolic function is normal.   LV end diastolic pressure is mildly elevated.   The left ventricular ejection fraction is 55-65% by visual estimate.   There is no mitral valve regurgitation.   Severe three vessel CAD The LAD is a large caliber vessel that courses to the apex giving off one large diagonal branch. The mid LAD has a severe stenosis just after the takeoff of the Diagonal branch. There is diffuse disease in the mid LAD beyond this stenosis. Severe distal LAD stenosis. The Diagonal branch has moderate non-obstructive disease.  The Circumflex gives off several moderate caliber obtuse marginal branches. The first OM branch has moderately severe proximal stenosis. The second obtuse marginal branch has moderately severe proximal stenosis. The AV groove Circumflex has severe stenosis in the mid segment beyond the takeoff of OM2.  The RCA is a large, calcified vessel with an aneurysmal mid segment and heavily calcified, severe distal stenosis.  Normal LV systolic function    Recommendations: Will consult CT surgery for CABG in this diabetic patient with severe, multi-vessel CAD. Continue ASA, statin and beta blocker. Echo today. Admit to telemetry.    Coronary Diagrams  Diagnostic Dominance: Right Intervention  Patient Profile     75 y.o. male with history of DM 2, obesity, HLD and HTN. Presents with 3  week history of progressive chest tightness. CTA c/w severe CAD  Assessment & Plan    Progressive angina. Cardiac cath demonstrates severe segmental 3 vessel disease. Poorly suited for PCI. Plan CABG on Monday. Echo and vascular dopplers today.  DM metformin on hold. SSI HTN controlled HLD. On high dose crestor now.   For questions or updates, please contact [place] Please consult [email or web address] for contact info under        Signed, Jemima Petko [place], MD  07/13/2021, 9:51 AM

## 2021-07-13 NOTE — Progress Notes (Signed)
CARDIAC REHAB PHASE I   PRE:  Rate/Rhythm: 68 SR  BP:  Supine:   Sitting: 116/82  Standing:    SaO2: 96%RA  MODE:  Ambulation: 325 ft   POST:  Rate/Rhythm: 79 SR  BP:  Supine:   Sitting: 135/75  Standing:    SaO2: 97%RA 1300-1355 Pt walked 325 ft on RA with steady gait. No CP. Rocked to stand and practiced not using arms. Discussed with pt sternal precautions and staying in the tube. Discussed importance of walking and IS to recovery of lungs. Gave IS and pt able to get to the top with cues. Wife will be available after discharge to assist in care. Gave [facility] booklet, care guide, staying in the tube handout and wrote down how to view pre op video.   Graylon Good, RN BSN  07/13/2021 1:52 PM

## 2021-07-13 NOTE — Plan of Care (Signed)

## 2021-07-13 NOTE — Progress Notes (Signed)
1 Day Post-Op Procedure(s) (LRB): LEFT HEART CATH AND CORONARY ANGIOGRAPHY (N/A) Subjective: No CP or SOB  Objective: Vital signs in last 24 hours: Temp:  [97.1 F (36.2 C)-99.1 F (37.3 C)] 99.1 F (37.3 C) (09/09 1616) Pulse Rate:  [57-62] 57 (09/09 1616) Cardiac Rhythm: Normal sinus rhythm (09/09 0704) Resp:  [18-25] 19 (09/09 1616) BP: (116-140)/(70-90) 116/77 (09/09 1616) SpO2:  [96 %-100 %] 99 % (09/09 1616) Weight:  [119.8 kg] 119.8 kg (09/09 0403)  Hemodynamic parameters for last 24 hours:    Intake/Output from previous day: 09/08 0701 - 09/09 0700 In: 1730.1 [P.O.:480; I.V.:1250.1] Out: 1850 [Urine:1850] Intake/Output this shift: Total I/O In: 180 [P.O.:177; I.V.:3] Out: 500 [Urine:500]  General appearance: alert, cooperative, and no distress Neurologic: intact Heart: regular rate and rhythm  Lab Results: Recent Labs    07/12/21 0841  WBC 3.7*  HGB 13.6  HCT 42.0  PLT 183   BMET: No results for input(s): NA, K, CL, CO2, GLUCOSE, BUN, CREATININE, CALCIUM in the last 72 hours.  PT/INR: No results for input(s): LABPROT, INR in the last 72 hours. ABG No results found for: PHART, HCO3, TCO2, ACIDBASEDEF, O2SAT CBG (last 3)  Recent Labs    07/13/21 0604 07/13/21 1116 07/13/21 1618  GLUCAP 105* 98 102*    Assessment/Plan: S/P Procedure(s) (LRB): LEFT HEART CATH AND CORONARY ANGIOGRAPHY (N/A) 3 vessel CAD He has decided to proceed with CABG on Monday 9/12 All questions answered Carotids Ok Mild PAD on R, no PAD on left Echo EF 55-60%, valves OK  For CABG 1st case Monday   LOS: 1 day    Melrose Nakayama 07/13/2021

## 2021-07-14 ENCOUNTER — Inpatient Hospital Stay (HOSPITAL_COMMUNITY): Payer: Medicare PPO

## 2021-07-14 DIAGNOSIS — I25110 Atherosclerotic heart disease of native coronary artery with unstable angina pectoris: Secondary | ICD-10-CM | POA: Diagnosis not present

## 2021-07-14 LAB — GLUCOSE, CAPILLARY
Glucose-Capillary: 111 mg/dL — ABNORMAL HIGH (ref 70–99)
Glucose-Capillary: 120 mg/dL — ABNORMAL HIGH (ref 70–99)
Glucose-Capillary: 123 mg/dL — ABNORMAL HIGH (ref 70–99)
Glucose-Capillary: 108 mg/dL — ABNORMAL HIGH (ref 70–99)

## 2021-07-14 LAB — SURGICAL PCR SCREEN
MRSA, PCR: NEGATIVE
Staphylococcus aureus: NEGATIVE
Staphylococcus aureus: NEGATIVE

## 2021-07-14 MED ORDER — MAGNESIUM SULFATE 50 % IJ SOLN
40.0000 meq | INTRAMUSCULAR | Status: DC
  Filled 2021-07-14: qty 9.85

## 2021-07-14 MED ORDER — CEFAZOLIN SODIUM-DEXTROSE 2-4 GM/100ML-% IV SOLN
2.0000 g | INTRAVENOUS | Status: AC
  Administered 2021-07-16: 2 g via INTRAVENOUS
  Filled 2021-07-14: qty 100

## 2021-07-14 MED ORDER — POTASSIUM CHLORIDE 2 MEQ/ML IV SOLN
80.0000 meq | INTRAVENOUS | Status: DC
  Filled 2021-07-14: qty 40

## 2021-07-14 MED ORDER — MILRINONE LACTATE IN DEXTROSE 20-5 MG/100ML-% IV SOLN
0.3000 ug/kg/min | INTRAVENOUS | Status: DC
  Filled 2021-07-14: qty 100

## 2021-07-14 MED ORDER — EPINEPHRINE HCL 5 MG/250ML IV SOLN IN NS
0.0000 ug/min | INTRAVENOUS | Status: DC
  Filled 2021-07-14: qty 250

## 2021-07-14 MED ORDER — INSULIN REGULAR(HUMAN) IN NACL 100-0.9 UT/100ML-% IV SOLN
INTRAVENOUS | Status: AC
  Administered 2021-07-16: 1.9 [IU]/h via INTRAVENOUS
  Filled 2021-07-14: qty 100

## 2021-07-14 MED ORDER — PHENYLEPHRINE HCL-NACL 20-0.9 MG/250ML-% IV SOLN
30.0000 ug/min | INTRAVENOUS | Status: AC
  Administered 2021-07-16: 15 ug/min via INTRAVENOUS
  Filled 2021-07-14: qty 250

## 2021-07-14 MED ORDER — TRANEXAMIC ACID 1000 MG/10ML IV SOLN
1.5000 mg/kg/h | INTRAVENOUS | Status: AC
  Administered 2021-07-16: 1.5 mg/kg/h via INTRAVENOUS
  Filled 2021-07-14: qty 25

## 2021-07-14 MED ORDER — NOREPINEPHRINE 4 MG/250ML-% IV SOLN
0.0000 ug/min | INTRAVENOUS | Status: DC
  Filled 2021-07-14: qty 250

## 2021-07-14 MED ORDER — DEXMEDETOMIDINE HCL IN NACL 400 MCG/100ML IV SOLN
0.1000 ug/kg/h | INTRAVENOUS | Status: AC
  Administered 2021-07-16: .4 ug/kg/h via INTRAVENOUS
  Filled 2021-07-14: qty 100

## 2021-07-14 MED ORDER — TRANEXAMIC ACID (OHS) PUMP PRIME SOLUTION
2.0000 mg/kg | INTRAVENOUS | Status: DC
  Filled 2021-07-14: qty 2.38

## 2021-07-14 MED ORDER — NITROGLYCERIN IN D5W 200-5 MCG/ML-% IV SOLN
2.0000 ug/min | INTRAVENOUS | Status: DC
  Filled 2021-07-14: qty 250

## 2021-07-14 MED ORDER — SODIUM CHLORIDE 0.9 % IV SOLN
INTRAVENOUS | Status: DC
  Filled 2021-07-14: qty 30

## 2021-07-14 MED ORDER — TRANEXAMIC ACID (OHS) BOLUS VIA INFUSION
15.0000 mg/kg | INTRAVENOUS | Status: AC
  Administered 2021-07-16: 1788 mg via INTRAVENOUS
  Filled 2021-07-14: qty 1788

## 2021-07-14 MED ORDER — PLASMA-LYTE A IV SOLN
INTRAVENOUS | Status: DC
  Filled 2021-07-14: qty 5

## 2021-07-14 MED ORDER — VANCOMYCIN HCL 1500 MG/300ML IV SOLN
1500.0000 mg | INTRAVENOUS | Status: AC
  Administered 2021-07-16: 1500 mg via INTRAVENOUS
  Filled 2021-07-14: qty 300

## 2021-07-14 NOTE — Progress Notes (Signed)
[id number] Pt on phone. Checked to see if any questions re preop ed done. Pt stated he had read [facility] book and was using IS. Encouraged to walk with staff this weekend. Graylon Good RN BSN 07/14/2021 12:01 PM

## 2021-07-14 NOTE — Progress Notes (Signed)
Progress Note  Patient Name: Oscar [name] Date of Encounter: 07/14/2021  [facility] Cardiologist: Pixie Casino, MD   Patient Profile     75 y.o. male with history of DM 2, obesity, HLD and HTN. Presents with 3 week history of progressive chest tightness. CTA c/w severe CAD Cath 3V CAD  For CABG Monday EF 55%  Subjective   Denies chest pain tolerating meds   Inpatient Medications    Scheduled Meds:  amLODipine  5 mg Oral Daily   aspirin EC  81 mg Oral Daily   atorvastatin  80 mg Oral Daily   colchicine  0.6 mg Oral Q breakfast   losartan  50 mg Oral QAC breakfast   magnesium oxide  400 mg Oral Daily   metoprolol succinate  25 mg Oral BID   pantoprazole  40 mg Oral Daily   sodium chloride flush  3 mL Intravenous Q12H   sodium chloride flush  3 mL Intravenous Q12H   Continuous Infusions:  sodium chloride     PRN Meds: sodium chloride, acetaminophen, nitroGLYCERIN, ondansetron (ZOFRAN) IV, sodium chloride flush   Vital Signs    Vitals:   07/14/21 0032 07/14/21 0455 07/14/21 0457 07/14/21 0730  BP: 120/72 (!) 123/95 (!) 123/95 137/78  Pulse: 68 87 61 60  Resp: '19  17 18  '$ Temp: 98.6 F (37 C)  98.3 F (36.8 C) 98.3 F (36.8 C)  TempSrc: Oral  Oral Oral  SpO2:  90% 97% 98%  Weight:   119.2 kg   Height:        Intake/Output Summary (Last 24 hours) at 07/14/2021 1343 Last data filed at 07/14/2021 1343 Gross per 24 hour  Intake 366 ml  Output 1575 ml  Net -1209 ml    Last 3 Weights 07/14/2021 07/13/2021 07/12/2021  Weight (lbs) 262 lb 12.8 oz 264 lb 1.6 oz 264 lb 12.4 oz  Weight (kg) 119.205 kg 119.795 kg 120.1 kg      Telemetry    Normal sinus - Personally Reviewed  ECG    none - Personally Reviewed  Physical Exam   GEN: No acute distress.   Neck: No JVD Cardiac: RRR, no murmurs, rubs, or gallops.  Respiratory: Clear to auscultation bilaterally. GI: Soft, nontender, non-distended  MS: No edema; No deformity. No radial site hematoma Neuro:   Nonfocal  Psych: Normal affect   Labs    High Sensitivity Troponin:  No results for input(s): TROPONINIHS in the last 720 hours.    ChemistryNo results for input(s): NA, K, CL, CO2, GLUCOSE, BUN, CREATININE, CALCIUM, PROT, ALBUMIN, AST, ALT, ALKPHOS, BILITOT, GFRNONAA, GFRAA, ANIONGAP in the last 168 hours.   Hematology Recent Labs  Lab 07/12/21 0841  WBC 3.7*  RBC 4.48  HGB 13.6  HCT 42.0  MCV 93.8  MCH 30.4  MCHC 32.4  RDW 12.4  PLT 183     BNPNo results for input(s): BNP, PROBNP in the last 168 hours.   DDimer No results for input(s): DDIMER in the last 168 hours.   Radiology    ECHOCARDIOGRAM COMPLETE  Result Date: 07/13/2021    ECHOCARDIOGRAM REPORT   Patient Name:   Oscar [name] Date of Exam: 07/13/2021 Medical Rec #:  [id number]     Height:       72.0 in Accession #:    [id number]    Weight:       264.1 lb Date of Birth:  [birth year]      BSA:  2.398 m Patient Age:    18 years      BP:           110/75 mmHg Patient Gender: M             HR:           62 bpm. Exam Location:  Inpatient Procedure: 2D Echo, Cardiac Doppler and Color Doppler Indications:    CAD  History:        Patient has prior history of Echocardiogram examinations, most                 recent 07/22/2016. CAD, Signs/Symptoms:Dyspnea; Risk                 Factors:Dyslipidemia.  Sonographer:    MH Referring Phys: [place]  1. Left ventricular ejection fraction, by estimation, is 55 to 60%. The left ventricle has normal function. The left ventricle has no regional wall motion abnormalities. There is moderate left ventricular hypertrophy. Left ventricular diastolic parameters are indeterminate.  2. Right ventricular systolic function is normal. The right ventricular size is normal.  3. The mitral valve is normal in structure. No evidence of mitral valve regurgitation.  4. The aortic valve is tricuspid. Aortic valve regurgitation is not visualized. No aortic stenosis is present.  5.  Aortic dilatation noted. There is mild dilatation of the ascending aorta, measuring 39 mm. FINDINGS  Left Ventricle: Left ventricular ejection fraction, by estimation, is 55 to 60%. The left ventricle has normal function. The left ventricle has no regional wall motion abnormalities. The left ventricular internal cavity size was normal in size. There is  moderate left ventricular hypertrophy. Left ventricular diastolic parameters are indeterminate. Right Ventricle: The right ventricular size is normal. Right vetricular wall thickness was not well visualized. Right ventricular systolic function is normal. Left Atrium: Left atrial size was normal in size. Right Atrium: Right atrial size was normal in size. Pericardium: There is no evidence of pericardial effusion. Mitral Valve: The mitral valve is normal in structure. No evidence of mitral valve regurgitation. Tricuspid Valve: The tricuspid valve is normal in structure. Tricuspid valve regurgitation is trivial. Aortic Valve: The aortic valve is tricuspid. Aortic valve regurgitation is not visualized. No aortic stenosis is present. Aortic valve mean gradient measures 3.0 mmHg. Aortic valve peak gradient measures 5.9 mmHg. Aortic valve area, by VTI measures 3.84 cm. Pulmonic Valve: The pulmonic valve was not well visualized. Pulmonic valve regurgitation is not visualized. Aorta: The aortic root is normal in size and structure and aortic dilatation noted. There is mild dilatation of the ascending aorta, measuring 39 mm. IAS/Shunts: The interatrial septum was not well visualized.  LEFT VENTRICLE PLAX 2D LVIDd:         3.90 cm     Diastology LVIDs:         2.80 cm     LV e' medial:    5.33 cm/s LV PW:         1.60 cm     LV E/e' medial:  12.8 LV IVS:        1.70 cm     LV e' lateral:   4.46 cm/s LVOT diam:     2.70 cm     LV E/e' lateral: 15.3 LV SV:         105 LV SV Index:   44 LVOT Area:     5.73 cm  LV Volumes (MOD) LV vol d, MOD A4C: 72.4 ml LV  vol s, MOD A4C: 29.1  ml LV SV MOD A4C:     72.4 ml RIGHT VENTRICLE RV S prime:     9.68 cm/s TAPSE (M-mode): 2.0 cm LEFT ATRIUM             Index       RIGHT ATRIUM          Index LA diam:        2.80 cm 1.17 cm/m  RA Area:     9.35 cm LA Vol (A2C):   70.5 ml 29.40 ml/m RA Volume:   14.60 ml 6.09 ml/m LA Vol (A4C):   38.3 ml 15.97 ml/m LA Biplane Vol: 53.3 ml 22.23 ml/m  AORTIC VALVE                   PULMONIC VALVE AV Area (Vmax):    3.49 cm    PV Vmax:       0.65 m/s AV Area (Vmean):   3.09 cm    PV Peak grad:  1.7 mmHg AV Area (VTI):     3.84 cm AV Vmax:           121.00 cm/s AV Vmean:          87.700 cm/s AV VTI:            0.273 m AV Peak Grad:      5.9 mmHg AV Mean Grad:      3.0 mmHg LVOT Vmax:         73.80 cm/s LVOT Vmean:        47.400 cm/s LVOT VTI:          0.183 m LVOT/AV VTI ratio: 0.67  AORTA Ao Root diam: 3.40 cm Ao Asc diam:  3.90 cm MITRAL VALVE               TRICUSPID VALVE MV Area (PHT): 2.20 cm    TR Peak grad:   15.7 mmHg MV E velocity: 68.10 cm/s  TR Vmax:        198.00 cm/s MV A velocity: 89.50 cm/s MV E/A ratio:  0.76        SHUNTS                            Systemic VTI:  0.18 m                            Systemic Diam: 2.70 cm Oswaldo Milian MD Electronically signed by Oswaldo Milian MD Signature Date/Time: 07/13/2021/3:23:23 PM    Final    VAS US DOPPLER PRE CABG  Result Date: 07/13/2021 PREOPERATIVE VASCULAR EVALUATION Patient Name:  Oscar [name]  Date of Exam:   07/13/2021 Medical Rec #: [id number]      Accession #:    [id number] Date of Birth: 02-04-1946       Patient Gender: M Patient Age:   75 years Exam Location:  [facility] Procedure:      VAS US DOPPLER PRE CABG Referring Phys: Remo Lipps HENDRICKSON --------------------------------------------------------------------------------  Indications:      Pre-CABG. Risk Factors:     Hypertension, hyperlipidemia, Diabetes, past history of                   smoking, coronary artery disease. Other Factors:    HX of heart caths.  Comparison Study: No previous exams Performing Technologist: Hill, Jody RVT, RDMS  Examination Guidelines: A complete evaluation includes B-mode imaging, spectral Doppler, color Doppler, and power Doppler as needed of all accessible portions of each vessel. Bilateral testing is considered an integral part of a complete examination. Limited examinations for reoccurring indications may be performed as noted.  Right Carotid Findings: +----------+--------+-------+--------+----------------------+------------------+           PSV cm/sEDV    StenosisDescribe              Comments                             cm/s                                                    +----------+--------+-------+--------+----------------------+------------------+ CCA Prox  81      15                                   intimal thickening +----------+--------+-------+--------+----------------------+------------------+ CCA Distal65      15             heterogenous and      intimal thickening                                  smooth                                   +----------+--------+-------+--------+----------------------+------------------+ ICA Prox  53      22                                                      +----------+--------+-------+--------+----------------------+------------------+ ICA Distal48      17                                                      +----------+--------+-------+--------+----------------------+------------------+ ECA       74      7                                                       +----------+--------+-------+--------+----------------------+------------------+ +----------+--------+-------+----------------+------------+           PSV cm/sEDV cmsDescribe        Arm Pressure +----------+--------+-------+----------------+------------+ Subclavian67             Multiphasic, WNL             +----------+--------+-------+----------------+------------+  +---------+--------+--+--------+-+---------+ VertebralPSV cm/s24EDV cm/s6Antegrade +---------+--------+--+--------+-+---------+ Left Carotid Findings: +----------+--------+--------+--------+--------+--------+           PSV cm/sEDV cm/sStenosisDescribeComments +----------+--------+--------+--------+--------+--------+ CCA Prox  99      16                               +----------+--------+--------+--------+--------+--------+  CCA Distal65      11                               +----------+--------+--------+--------+--------+--------+ ICA Prox  32      13                               +----------+--------+--------+--------+--------+--------+ ICA Distal57      25                               +----------+--------+--------+--------+--------+--------+ ECA       86      10                               +----------+--------+--------+--------+--------+--------+ +----------+--------+--------+--------+------------+ SubclavianPSV cm/sEDV cm/sDescribeArm Pressure +----------+--------+--------+--------+------------+           85                                   +----------+--------+--------+--------+------------+ +---------+--------+--+--------+--+ VertebralPSV cm/s36EDV cm/s14 +---------+--------+--+--------+--+  ABI Findings: +---------+------------------+-----+----------+--------+ Right    Rt Pressure (mmHg)IndexWaveform  Comment  +---------+------------------+-----+----------+--------+ Brachial 137                    triphasic          +---------+------------------+-----+----------+--------+ PTA      121               0.88 monophasic         +---------+------------------+-----+----------+--------+ DP       122               0.88 monophasic         +---------+------------------+-----+----------+--------+ Great Toe60                0.43 Abnormal           +---------+------------------+-----+----------+--------+  +---------+------------------+-----+---------+-------+ Left     Lt Pressure (mmHg)IndexWaveform Comment +---------+------------------+-----+---------+-------+ Brachial 138                    triphasic        +---------+------------------+-----+---------+-------+ PTA      146               1.06 biphasic         +---------+------------------+-----+---------+-------+ DP       138               1.00 biphasic         +---------+------------------+-----+---------+-------+ [place]                0.64 Abnormal         +---------+------------------+-----+---------+-------+ +-------+---------------+----------------+ ABI/TBIToday's ABI/TBIPrevious ABI/TBI +-------+---------------+----------------+ Right  0.88/0.43                       +-------+---------------+----------------+ Left   1.06/0.64                       +-------+---------------+----------------+  Right Doppler Findings: +--------+--------+-----+---------+--------+ Site    PressureIndexDoppler  Comments +--------+--------+-----+---------+--------+ [id number]          triphasic         +--------+--------+-----+---------+--------+ Radial  triphasic         +--------+--------+-----+---------+--------+ Ulnar                triphasic         +--------+--------+-----+---------+--------+  Left Doppler Findings: +--------+--------+-----+---------+--------+ Site    PressureIndexDoppler  Comments +--------+--------+-----+---------+--------+ [id number]          triphasic         +--------+--------+-----+---------+--------+ Radial               triphasic         +--------+--------+-----+---------+--------+ Ulnar                triphasic         +--------+--------+-----+---------+--------+  Summary: Right Carotid: The extracranial vessels were near-normal with only minimal wall                thickening or plaque. Left Carotid: The extracranial vessels were near-normal  with only minimal wall               thickening or plaque. Vertebrals:  Bilateral vertebral arteries demonstrate antegrade flow. Subclavians: Normal flow hemodynamics were seen in bilateral subclavian              arteries. Right ABI: Resting right ankle-brachial index indicates mild right lower extremity arterial disease. The right toe-brachial index is abnormal. ABI shows mild disease, however waveforms are monophasic. TBI indicates moderate small vessel disease. Left ABI: Resting left ankle-brachial index is within normal range. No evidence of significant left lower extremity arterial disease. The left toe-brachial index is abnormal. TBI indicates mild small vessel disease. Right Upper Extremity: Normal PPG waveforms with radial artery compression suggest palmar arch patency. Doppler waveforms remain within normal limits with right radial compression. Doppler waveforms remain within normal limits with right ulnar compression. Left Upper Extremity: Normal PPG waveforms with radial artery compression suggest palmar arch patency. Doppler waveforms remain within normal limits with left radial compression. Doppler waveforms remain within normal limits with left ulnar compression.  Electronically signed by Jamelle Haring on 07/13/2021 at 4:43:51 PM.    Final     Cardiac Studies   Procedures  LEFT HEART CATH AND CORONARY ANGIOGRAPHY   Conclusion      Prox RCA lesion is 50% stenosed.   Dist RCA lesion is 80% stenosed.   Prox Cx lesion is 50% stenosed.   1st Mrg lesion is 70% stenosed.   2nd Mrg lesion is 80% stenosed.   Mid Cx to Dist Cx lesion is 90% stenosed.   Prox LAD to Mid LAD lesion is 75% stenosed.   Mid LAD lesion is 70% stenosed.   Dist LAD lesion is 99% stenosed.   1st Diag lesion is 50% stenosed.   The left ventricular systolic function is normal.   LV end diastolic pressure is mildly elevated.   The left ventricular ejection fraction is 55-65% by visual estimate.   There is no mitral  valve regurgitation.   Severe three vessel CAD The LAD is a large caliber vessel that courses to the apex giving off one large diagonal branch. The mid LAD has a severe stenosis just after the takeoff of the Diagonal branch. There is diffuse disease in the mid LAD beyond this stenosis. Severe distal LAD stenosis. The Diagonal branch has moderate non-obstructive disease.  The Circumflex gives off several moderate caliber obtuse marginal branches. The first OM branch has moderately severe proximal stenosis. The second obtuse marginal branch has moderately severe proximal stenosis. The AV groove Circumflex  has severe stenosis in the mid segment beyond the takeoff of OM2.  The RCA is a large, calcified vessel with an aneurysmal mid segment and heavily calcified, severe distal stenosis.  Normal LV systolic function    Recommendations: Will consult CT surgery for CABG in this diabetic patient with severe, multi-vessel CAD. Continue ASA, statin and beta blocker. Echo today. Admit to telemetry.    Coronary Diagrams  Diagnostic Dominance: Right Intervention    Assessment & Plan    Progressive angina. Cardiac cath demonstrates severe segmental 3 vessel disease. Poorly suited for PCI. Plan CABG on Monday.  DM metformin on hold. SSI HTN controlled HLD. On high dose crestor now.    Stable  For CABG Monday       Signed, Virl Axe, MD  07/14/2021, 1:43 PM

## 2021-07-14 NOTE — Plan of Care (Signed)

## 2021-07-14 NOTE — Plan of Care (Signed)
  Problem: Skin Integrity: ?Goal: Risk for impaired skin integrity will decrease ?Outcome: Completed/Met ?  ?Problem: Safety: ?Goal: Ability to remain free from injury will improve ?Outcome: Completed/Met ?  ?Problem: Pain Managment: ?Goal: General experience of comfort will improve ?Outcome: Completed/Met ?  ?Problem: Elimination: ?Goal: Will not experience complications related to urinary retention ?Outcome: Completed/Met ?  ?Problem: Elimination: ?Goal: Will not experience complications related to bowel motility ?Outcome: Completed/Met ?  ?Problem: Coping: ?Goal: Level of anxiety will decrease ?Outcome: Completed/Met ?  ?Problem: Nutrition: ?Goal: Adequate nutrition will be maintained ?Outcome: Completed/Met ?  ?Problem: Activity: ?Goal: Risk for activity intolerance will decrease ?Outcome: Completed/Met ?  ?

## 2021-07-15 ENCOUNTER — Encounter (HOSPITAL_COMMUNITY): Payer: Self-pay | Admitting: Cardiovascular Disease

## 2021-07-15 DIAGNOSIS — I25110 Atherosclerotic heart disease of native coronary artery with unstable angina pectoris: Secondary | ICD-10-CM | POA: Diagnosis not present

## 2021-07-15 LAB — PROTIME-INR
INR: 1.1 (ref 0.8–1.2)
Prothrombin Time: 14.1 seconds (ref 11.4–15.2)

## 2021-07-15 LAB — COMPREHENSIVE METABOLIC PANEL
ALT: 28 U/L (ref 0–44)
AST: 23 U/L (ref 15–41)
Albumin: 3.5 g/dL (ref 3.5–5.0)
Alkaline Phosphatase: 67 U/L (ref 38–126)
Anion gap: 7 (ref 5–15)
BUN: 14 mg/dL (ref 8–23)
CO2: 26 mmol/L (ref 22–32)
Calcium: 9.1 mg/dL (ref 8.9–10.3)
Chloride: 106 mmol/L (ref 98–111)
Creatinine, Ser: 1.32 mg/dL — ABNORMAL HIGH (ref 0.61–1.24)
GFR, Estimated: 56 mL/min — ABNORMAL LOW (ref >60)
Glucose, Bld: 123 mg/dL — ABNORMAL HIGH (ref 70–99)
Potassium: 4.4 mmol/L (ref 3.5–5.1)
Sodium: 139 mmol/L (ref 135–145)
Total Bilirubin: 0.5 mg/dL (ref 0.3–1.2)
Total Protein: 6.3 g/dL — ABNORMAL LOW (ref 6.5–8.1)

## 2021-07-15 LAB — GLUCOSE, CAPILLARY
Glucose-Capillary: 124 mg/dL — ABNORMAL HIGH (ref 70–99)
Glucose-Capillary: 134 mg/dL — ABNORMAL HIGH (ref 70–99)
Glucose-Capillary: 102 mg/dL — ABNORMAL HIGH (ref 70–99)
Glucose-Capillary: 146 mg/dL — ABNORMAL HIGH (ref 70–99)

## 2021-07-15 LAB — HEMOGLOBIN A1C
Hgb A1c MFr Bld: 5.3 % (ref 4.8–5.6)
Mean Plasma Glucose: 105.41 mg/dL

## 2021-07-15 LAB — TYPE AND SCREEN
ABO/RH(D): O POS
Antibody Screen: NEGATIVE

## 2021-07-15 LAB — APTT: aPTT: 33 seconds (ref 24–36)

## 2021-07-15 LAB — SARS CORONAVIRUS 2 (TAT 6-24 HRS): SARS Coronavirus 2: NEGATIVE

## 2021-07-15 MED ORDER — CHLORHEXIDINE GLUCONATE CLOTH 2 % EX PADS
6.0000 | MEDICATED_PAD | Freq: Once | CUTANEOUS | Status: AC
  Administered 2021-07-16: 6 via TOPICAL

## 2021-07-15 MED ORDER — DIAZEPAM 2 MG PO TABS
2.0000 mg | ORAL_TABLET | Freq: Once | ORAL | Status: AC
  Administered 2021-07-16: 2 mg via ORAL
  Filled 2021-07-15: qty 1

## 2021-07-15 MED ORDER — METOPROLOL TARTRATE 12.5 MG HALF TABLET
12.5000 mg | ORAL_TABLET | Freq: Once | ORAL | Status: AC
  Administered 2021-07-16: 12.5 mg via ORAL
  Filled 2021-07-15: qty 1

## 2021-07-15 MED ORDER — CHLORHEXIDINE GLUCONATE CLOTH 2 % EX PADS
6.0000 | MEDICATED_PAD | Freq: Once | CUTANEOUS | Status: AC
  Administered 2021-07-15: 6 via TOPICAL

## 2021-07-15 MED ORDER — CHLORHEXIDINE GLUCONATE 0.12 % MT SOLN
15.0000 mL | Freq: Once | OROMUCOSAL | Status: AC
  Administered 2021-07-16: 15 mL via OROMUCOSAL
  Filled 2021-07-15: qty 15

## 2021-07-15 MED ORDER — BISACODYL 5 MG PO TBEC: 5.0000 mg | DELAYED_RELEASE_TABLET | Freq: Once | ORAL | Status: DC

## 2021-07-15 NOTE — Plan of Care (Signed)
Patient progressing 

## 2021-07-15 NOTE — Anesthesia Preprocedure Evaluation (Addendum)
Anesthesia Evaluation  Patient identified by MRN, date of birth, ID band Patient awake    Reviewed: Allergy & Precautions, NPO status , Patient's Chart, lab work & pertinent test results, reviewed documented beta blocker date and time   Airway Mallampati: II  TM Distance: >3 FB Neck ROM: Full    Dental  (+) Dental Advisory Given, Chipped   Pulmonary shortness of breath, former smoker,    Pulmonary exam normal breath sounds clear to auscultation       Cardiovascular hypertension, Pt. on medications and Pt. on home beta blockers + angina + CAD  Normal cardiovascular exam+ dysrhythmias  Rhythm:Irregular Rate:Normal     Neuro/Psych    GI/Hepatic negative GI ROS, Neg liver ROS,   Endo/Other  diabetes, Type 2, Oral Hypoglycemic Agents  Renal/GU negative Renal ROS     Musculoskeletal  (+) Arthritis ,   Abdominal   Peds  Hematology   Anesthesia Other Findings   Reproductive/Obstetrics                           Anesthesia Physical Anesthesia Plan  ASA: 3  Anesthesia Plan: General   Post-op Pain Management:    Induction: Intravenous  PONV Risk Score and Plan: Ondansetron, Midazolam and Dexamethasone  Airway Management Planned: Oral ETT  Additional Equipment:   Intra-op Plan:   Post-operative Plan: Post-operative intubation/ventilation  Informed Consent:   Plan Discussed with: Anesthesiologist  Anesthesia Plan Comments:         Anesthesia Quick Evaluation

## 2021-07-15 NOTE — Progress Notes (Signed)
Progress Note  Patient Name: Oscar [name] Date of Encounter: 07/15/2021  [facility] Cardiologist: Pixie Casino, MD   Patient Profile     75 y.o. male with history of DM 2, obesity, HLD and HTN. Presents with 3 week history of progressive chest tightness. CTA c/w severe CAD Cath 3V CAD  For CABG Monday EF 55%  Subjective   Without chest pain or SOB   Inpatient Medications    Scheduled Meds:  amLODipine  5 mg Oral Daily   aspirin EC  81 mg Oral Daily   atorvastatin  80 mg Oral Daily   colchicine  0.6 mg Oral Q breakfast   [START ON 07/16/2021] epinephrine  0-10 mcg/min Intravenous To OR   [START ON 07/16/2021] heparin-papaverine-plasmalyte irrigation   Irrigation To OR   [START ON 07/16/2021] insulin   Intravenous To OR   losartan  50 mg Oral QAC breakfast   magnesium oxide  400 mg Oral Daily   [START ON 07/16/2021] magnesium sulfate  40 mEq Other To OR   metoprolol succinate  25 mg Oral BID   pantoprazole  40 mg Oral Daily   [START ON 07/16/2021] phenylephrine  30-200 mcg/min Intravenous To OR   [START ON 07/16/2021] potassium chloride  80 mEq Other To OR   sodium chloride flush  3 mL Intravenous Q12H   sodium chloride flush  3 mL Intravenous Q12H   [START ON 07/16/2021] tranexamic acid  15 mg/kg Intravenous To OR   [START ON 07/16/2021] tranexamic acid  2 mg/kg Intracatheter To OR   Continuous Infusions:  sodium chloride     [START ON 07/16/2021]  ceFAZolin (ANCEF) IV     [START ON 07/16/2021]  ceFAZolin (ANCEF) IV     [START ON 07/16/2021] dexmedetomidine     [START ON 07/16/2021] heparin 30,000 units/NS 1000 mL solution for CELLSAVER     [START ON 07/16/2021] milrinone     [START ON 07/16/2021] nitroGLYCERIN     [START ON 07/16/2021] norepinephrine     [START ON 07/16/2021] tranexamic acid (CYKLOKAPRON) infusion ([facility])     [START ON 07/16/2021] vancomycin     PRN Meds: sodium chloride, acetaminophen, nitroGLYCERIN, ondansetron (ZOFRAN) IV, sodium chloride flush    Vital Signs    Vitals:   07/14/21 2052 07/15/21 0116 07/15/21 0447 07/15/21 0832  BP: 102/72 117/68 106/67 90/66  Pulse: 63 65 72 64  Resp: 18  16   Temp: 98.4 F (36.9 C)  98.4 F (36.9 C) 98.3 F (36.8 C)  TempSrc: Oral  Oral Oral  SpO2: 99%  96% 100%  Weight:   119.9 kg   Height:        Intake/Output Summary (Last 24 hours) at 07/15/2021 1143 Last data filed at 07/15/2021 0834 Gross per 24 hour  Intake 580 ml  Output 1050 ml  Net -470 ml    Last 3 Weights 07/15/2021 07/14/2021 07/13/2021  Weight (lbs) 264 lb 6.4 oz 262 lb 12.8 oz 264 lb 1.6 oz  Weight (kg) 119.931 kg 119.205 kg 119.795 kg      Telemetry    Sinus - Personally Reviewed  ECG    none - Personally Reviewed  Physical Exam  Well developed and nourished in no acute distress HENT normal Neck supple Clear Regular rate and rhythm, no murmurs or gallops Abd-soft with active BS No Clubbing cyanosis edema Skin-warm and dry A & Oriented  Grossly normal sensory and motor function  ECG    Labs  High Sensitivity Troponin:  No results for input(s): TROPONINIHS in the last 720 hours.    Chemistry Recent Labs  Lab 07/15/21 0145  NA 139  K 4.4  CL 106  CO2 26  GLUCOSE 123*  BUN 14  CREATININE 1.32*  CALCIUM 9.1  PROT 6.3*  ALBUMIN 3.5  AST 23  ALT 28  ALKPHOS 67  BILITOT 0.5  GFRNONAA 56*  ANIONGAP 7      Hematology Recent Labs  Lab 07/12/21 0841  WBC 3.7*  RBC 4.48  HGB 13.6  HCT 42.0  MCV 93.8  MCH 30.4  MCHC 32.4  RDW 12.4  PLT 183     BNPNo results for input(s): BNP, PROBNP in the last 168 hours.   DDimer No results for input(s): DDIMER in the last 168 hours.   Radiology    DG Chest 2 View  Result Date: 07/14/2021 CLINICAL DATA:  Mid chest pain, previous tobacco abuse, hypertension EXAM: CHEST - 2 VIEW COMPARISON:  11/18/2012 FINDINGS: The heart size and mediastinal contours are within normal limits. Both lungs are clear. The visualized skeletal structures are  unremarkable. IMPRESSION: No active cardiopulmonary disease. Electronically Signed   By: Randa Ngo M.D.   On: 07/14/2021 20:57   VAS US DOPPLER PRE CABG  Result Date: 07/13/2021 PREOPERATIVE VASCULAR EVALUATION Patient Name:  Oscar [name]  Date of Exam:   07/13/2021 Medical Rec #: [id number]      Accession #:    [id number] Date of Birth: Dec 07, 1945       Patient Gender: M Patient Age:   49 years Exam Location:  [facility] Procedure:      VAS US DOPPLER PRE CABG Referring Phys: Remo Lipps HENDRICKSON --------------------------------------------------------------------------------  Indications:      Pre-CABG. Risk Factors:     Hypertension, hyperlipidemia, Diabetes, past history of                   smoking, coronary artery disease. Other Factors:    HX of heart caths. Comparison Study: No previous exams Performing Technologist: Hill, Jody RVT, RDMS  Examination Guidelines: A complete evaluation includes B-mode imaging, spectral Doppler, color Doppler, and power Doppler as needed of all accessible portions of each vessel. Bilateral testing is considered an integral part of a complete examination. Limited examinations for reoccurring indications may be performed as noted.  Right Carotid Findings: +----------+--------+-------+--------+----------------------+------------------+           PSV cm/sEDV    StenosisDescribe              Comments                             cm/s                                                    +----------+--------+-------+--------+----------------------+------------------+ CCA Prox  81      15                                   intimal thickening +----------+--------+-------+--------+----------------------+------------------+ CCA Distal65      15             heterogenous and      intimal thickening  smooth                                    +----------+--------+-------+--------+----------------------+------------------+ ICA Prox  53      22                                                      +----------+--------+-------+--------+----------------------+------------------+ ICA Distal48      17                                                      +----------+--------+-------+--------+----------------------+------------------+ ECA       74      7                                                       +----------+--------+-------+--------+----------------------+------------------+ +----------+--------+-------+----------------+------------+           PSV cm/sEDV cmsDescribe        Arm Pressure +----------+--------+-------+----------------+------------+ Subclavian67             Multiphasic, WNL             +----------+--------+-------+----------------+------------+ +---------+--------+--+--------+-+---------+ VertebralPSV cm/s24EDV cm/s6Antegrade +---------+--------+--+--------+-+---------+ Left Carotid Findings: +----------+--------+--------+--------+--------+--------+           PSV cm/sEDV cm/sStenosisDescribeComments +----------+--------+--------+--------+--------+--------+ CCA Prox  99      16                               +----------+--------+--------+--------+--------+--------+ CCA Distal65      11                               +----------+--------+--------+--------+--------+--------+ ICA Prox  32      13                               +----------+--------+--------+--------+--------+--------+ ICA Distal57      25                               +----------+--------+--------+--------+--------+--------+ ECA       86      10                               +----------+--------+--------+--------+--------+--------+ +----------+--------+--------+--------+------------+ SubclavianPSV cm/sEDV cm/sDescribeArm Pressure +----------+--------+--------+--------+------------+            85                                   +----------+--------+--------+--------+------------+ +---------+--------+--+--------+--+ VertebralPSV cm/s36EDV cm/s14 +---------+--------+--+--------+--+  ABI Findings: +---------+------------------+-----+----------+--------+ Right    Rt Pressure (mmHg)IndexWaveform  Comment  +---------+------------------+-----+----------+--------+ Brachial 137  triphasic          +---------+------------------+-----+----------+--------+ PTA      121               0.88 monophasic         +---------+------------------+-----+----------+--------+ DP       122               0.88 monophasic         +---------+------------------+-----+----------+--------+ Great Toe60                0.43 Abnormal           +---------+------------------+-----+----------+--------+ +---------+------------------+-----+---------+-------+ Left     Lt Pressure (mmHg)IndexWaveform Comment +---------+------------------+-----+---------+-------+ Brachial 138                    triphasic        +---------+------------------+-----+---------+-------+ PTA      146               1.06 biphasic         +---------+------------------+-----+---------+-------+ DP       138               1.00 biphasic         +---------+------------------+-----+---------+-------+ [place]                0.64 Abnormal         +---------+------------------+-----+---------+-------+ +-------+---------------+----------------+ ABI/TBIToday's ABI/TBIPrevious ABI/TBI +-------+---------------+----------------+ Right  0.88/0.43                       +-------+---------------+----------------+ Left   1.06/0.64                       +-------+---------------+----------------+  Right Doppler Findings: +--------+--------+-----+---------+--------+ Site    PressureIndexDoppler  Comments +--------+--------+-----+---------+--------+ [id number]          triphasic          +--------+--------+-----+---------+--------+ Radial               triphasic         +--------+--------+-----+---------+--------+ Ulnar                triphasic         +--------+--------+-----+---------+--------+  Left Doppler Findings: +--------+--------+-----+---------+--------+ Site    PressureIndexDoppler  Comments +--------+--------+-----+---------+--------+ [id number]          triphasic         +--------+--------+-----+---------+--------+ Radial               triphasic         +--------+--------+-----+---------+--------+ Ulnar                triphasic         +--------+--------+-----+---------+--------+  Summary: Right Carotid: The extracranial vessels were near-normal with only minimal wall                thickening or plaque. Left Carotid: The extracranial vessels were near-normal with only minimal wall               thickening or plaque. Vertebrals:  Bilateral vertebral arteries demonstrate antegrade flow. Subclavians: Normal flow hemodynamics were seen in bilateral subclavian              arteries. Right ABI: Resting right ankle-brachial index indicates mild right lower extremity arterial disease. The right toe-brachial index is abnormal. ABI shows mild disease, however waveforms are monophasic. TBI indicates moderate small vessel disease. Left ABI: Resting left ankle-brachial index is within normal range.  No evidence of significant left lower extremity arterial disease. The left toe-brachial index is abnormal. TBI indicates mild small vessel disease. Right Upper Extremity: Normal PPG waveforms with radial artery compression suggest palmar arch patency. Doppler waveforms remain within normal limits with right radial compression. Doppler waveforms remain within normal limits with right ulnar compression. Left Upper Extremity: Normal PPG waveforms with radial artery compression suggest palmar arch patency. Doppler waveforms remain within normal limits with left  radial compression. Doppler waveforms remain within normal limits with left ulnar compression.  Electronically signed by Jamelle Haring on 07/13/2021 at 4:43:51 PM.    Final     Cardiac Studies   Procedures  LEFT HEART CATH AND CORONARY ANGIOGRAPHY   Conclusion      Prox RCA lesion is 50% stenosed.   Dist RCA lesion is 80% stenosed.   Prox Cx lesion is 50% stenosed.   1st Mrg lesion is 70% stenosed.   2nd Mrg lesion is 80% stenosed.   Mid Cx to Dist Cx lesion is 90% stenosed.   Prox LAD to Mid LAD lesion is 75% stenosed.   Mid LAD lesion is 70% stenosed.   Dist LAD lesion is 99% stenosed.   1st Diag lesion is 50% stenosed.   The left ventricular systolic function is normal.   LV end diastolic pressure is mildly elevated.   The left ventricular ejection fraction is 55-65% by visual estimate.   There is no mitral valve regurgitation.   Severe three vessel CAD The LAD is a large caliber vessel that courses to the apex giving off one large diagonal branch. The mid LAD has a severe stenosis just after the takeoff of the Diagonal branch. There is diffuse disease in the mid LAD beyond this stenosis. Severe distal LAD stenosis. The Diagonal branch has moderate non-obstructive disease.  The Circumflex gives off several moderate caliber obtuse marginal branches. The first OM branch has moderately severe proximal stenosis. The second obtuse marginal branch has moderately severe proximal stenosis. The AV groove Circumflex has severe stenosis in the mid segment beyond the takeoff of OM2.  The RCA is a large, calcified vessel with an aneurysmal mid segment and heavily calcified, severe distal stenosis.  Normal LV systolic function    Recommendations: Will consult CT surgery for CABG in this diabetic patient with severe, multi-vessel CAD. Continue ASA, statin and beta blocker. Echo today. Admit to telemetry.    Coronary Diagrams  Diagnostic Dominance: Right Intervention    Assessment & Plan     Progressive angina. Cardiac cath demonstrates severe segmental 3 vessel disease. Poorly suited for PCI.   DM metformin on hold. SSI HTN controlled HLD. On high dose crestor now.    Stable   For CABG tomorrow       Signed, Virl Axe, MD  07/15/2021, 11:43 AM

## 2021-07-16 ENCOUNTER — Inpatient Hospital Stay (HOSPITAL_COMMUNITY): Payer: Medicare PPO

## 2021-07-16 ENCOUNTER — Inpatient Hospital Stay (HOSPITAL_COMMUNITY): Payer: Medicare PPO | Admitting: Certified Registered Nurse Anesthetist

## 2021-07-16 ENCOUNTER — Inpatient Hospital Stay (HOSPITAL_COMMUNITY)
Admission: AD | Disposition: A | Discharge status: Home Health | Payer: Self-pay | Source: Home / Self Care | Attending: Thoracic Surgery (Cardiothoracic Vascular Surgery)

## 2021-07-16 DIAGNOSIS — Z951 Presence of aortocoronary bypass graft: Secondary | ICD-10-CM

## 2021-07-16 DIAGNOSIS — I25110 Atherosclerotic heart disease of native coronary artery with unstable angina pectoris: Secondary | ICD-10-CM

## 2021-07-16 HISTORY — PX: TEE WITHOUT CARDIOVERSION: SHX5443

## 2021-07-16 HISTORY — PX: CORONARY ARTERY BYPASS GRAFT: SHX141

## 2021-07-16 HISTORY — PX: ENDOVEIN HARVEST OF GREATER SAPHENOUS VEIN: SHX5059

## 2021-07-16 LAB — POCT I-STAT 7, (LYTES, BLD GAS, ICA,H+H)
Acid-Base Excess: 1 mmol/L (ref 0.0–2.0)
Acid-Base Excess: 0 mmol/L (ref 0.0–2.0)
Acid-Base Excess: 2 mmol/L (ref 0.0–2.0)
Acid-base deficit: 2 mmol/L (ref 0.0–2.0)
Acid-base deficit: 3 mmol/L — ABNORMAL HIGH (ref 0.0–2.0)
Acid-base deficit: 4 mmol/L — ABNORMAL HIGH (ref 0.0–2.0)
Bicarbonate: 27 mmol/L (ref 20.0–28.0)
Bicarbonate: 25.6 mmol/L (ref 20.0–28.0)
Bicarbonate: 27.2 mmol/L (ref 20.0–28.0)
Bicarbonate: 23.6 mmol/L (ref 20.0–28.0)
Bicarbonate: 23.4 mmol/L (ref 20.0–28.0)
Bicarbonate: 20.7 mmol/L (ref 20.0–28.0)
Calcium, Ion: 1.25 mmol/L (ref 1.15–1.40)
Calcium, Ion: 1.01 mmol/L — ABNORMAL LOW (ref 1.15–1.40)
Calcium, Ion: 1.06 mmol/L — ABNORMAL LOW (ref 1.15–1.40)
Calcium, Ion: 1.06 mmol/L — ABNORMAL LOW (ref 1.15–1.40)
Calcium, Ion: 1.12 mmol/L — ABNORMAL LOW (ref 1.15–1.40)
Calcium, Ion: 1.16 mmol/L (ref 1.15–1.40)
HCT: 38 % — ABNORMAL LOW (ref 39.0–52.0)
HCT: 26 % — ABNORMAL LOW (ref 39.0–52.0)
HCT: 24 % — ABNORMAL LOW (ref 39.0–52.0)
HCT: 24 % — ABNORMAL LOW (ref 39.0–52.0)
HCT: 29 % — ABNORMAL LOW (ref 39.0–52.0)
HCT: 26 % — ABNORMAL LOW (ref 39.0–52.0)
Hemoglobin: 12.9 g/dL — ABNORMAL LOW (ref 13.0–17.0)
Hemoglobin: 8.8 g/dL — ABNORMAL LOW (ref 13.0–17.0)
Hemoglobin: 8.2 g/dL — ABNORMAL LOW (ref 13.0–17.0)
Hemoglobin: 8.2 g/dL — ABNORMAL LOW (ref 13.0–17.0)
Hemoglobin: 9.9 g/dL — ABNORMAL LOW (ref 13.0–17.0)
Hemoglobin: 8.8 g/dL — ABNORMAL LOW (ref 13.0–17.0)
O2 Saturation: 100 %
O2 Saturation: 100 %
O2 Saturation: 100 %
O2 Saturation: 94 %
O2 Saturation: 96 %
O2 Saturation: 98 %
Patient temperature: 35.9
Patient temperature: 37.7
Potassium: 4.2 mmol/L (ref 3.5–5.1)
Potassium: 4.2 mmol/L (ref 3.5–5.1)
Potassium: 4.8 mmol/L (ref 3.5–5.1)
Potassium: 4.1 mmol/L (ref 3.5–5.1)
Potassium: 4.2 mmol/L (ref 3.5–5.1)
Potassium: 4 mmol/L (ref 3.5–5.1)
Sodium: 140 mmol/L (ref 135–145)
Sodium: 141 mmol/L (ref 135–145)
Sodium: 139 mmol/L (ref 135–145)
Sodium: 139 mmol/L (ref 135–145)
Sodium: 141 mmol/L (ref 135–145)
Sodium: 140 mmol/L (ref 135–145)
TCO2: 28 mmol/L (ref 22–32)
TCO2: 27 mmol/L (ref 22–32)
TCO2: 29 mmol/L (ref 22–32)
TCO2: 25 mmol/L (ref 22–32)
TCO2: 25 mmol/L (ref 22–32)
TCO2: 22 mmol/L (ref 22–32)
pCO2 arterial: 48.5 mmHg — ABNORMAL HIGH (ref 32.0–48.0)
pCO2 arterial: 46.2 mmHg (ref 32.0–48.0)
pCO2 arterial: 47.7 mmHg (ref 32.0–48.0)
pCO2 arterial: 40.9 mmHg (ref 32.0–48.0)
pCO2 arterial: 42.3 mmHg (ref 32.0–48.0)
pCO2 arterial: 37 mmHg (ref 32.0–48.0)
pH, Arterial: 7.353 (ref 7.350–7.450)
pH, Arterial: 7.351 (ref 7.350–7.450)
pH, Arterial: 7.365 (ref 7.350–7.450)
pH, Arterial: 7.37 (ref 7.350–7.450)
pH, Arterial: 7.345 — ABNORMAL LOW (ref 7.350–7.450)
pH, Arterial: 7.36 (ref 7.350–7.450)
pO2, Arterial: 282 mmHg — ABNORMAL HIGH (ref 83.0–108.0)
pO2, Arterial: 387 mmHg — ABNORMAL HIGH (ref 83.0–108.0)
pO2, Arterial: 324 mmHg — ABNORMAL HIGH (ref 83.0–108.0)
pO2, Arterial: 74 mmHg — ABNORMAL LOW (ref 83.0–108.0)
pO2, Arterial: 79 mmHg — ABNORMAL LOW (ref 83.0–108.0)
pO2, Arterial: 117 mmHg — ABNORMAL HIGH (ref 83.0–108.0)

## 2021-07-16 LAB — CBC
HCT: 41.1 % (ref 39.0–52.0)
HCT: 28.9 % — ABNORMAL LOW (ref 39.0–52.0)
HCT: 29 % — ABNORMAL LOW (ref 39.0–52.0)
Hemoglobin: 13.7 g/dL (ref 13.0–17.0)
Hemoglobin: 9.6 g/dL — ABNORMAL LOW (ref 13.0–17.0)
Hemoglobin: 9.4 g/dL — ABNORMAL LOW (ref 13.0–17.0)
MCH: 30.8 pg (ref 26.0–34.0)
MCH: 31.1 pg (ref 26.0–34.0)
MCH: 30.6 pg (ref 26.0–34.0)
MCHC: 33.3 g/dL (ref 30.0–36.0)
MCHC: 33.2 g/dL (ref 30.0–36.0)
MCHC: 32.4 g/dL (ref 30.0–36.0)
MCV: 92.4 fL (ref 80.0–100.0)
MCV: 93.5 fL (ref 80.0–100.0)
MCV: 94.5 fL (ref 80.0–100.0)
Platelets: 161 10*3/uL (ref 150–400)
Platelets: 94 10*3/uL — ABNORMAL LOW (ref 150–400)
Platelets: 76 10*3/uL — ABNORMAL LOW (ref 150–400)
RBC: 4.45 MIL/uL (ref 4.22–5.81)
RBC: 3.09 MIL/uL — ABNORMAL LOW (ref 4.22–5.81)
RBC: 3.07 MIL/uL — ABNORMAL LOW (ref 4.22–5.81)
RDW: 12.4 % (ref 11.5–15.5)
RDW: 12.5 % (ref 11.5–15.5)
RDW: 12.4 % (ref 11.5–15.5)
WBC: 4.6 10*3/uL (ref 4.0–10.5)
WBC: 5.5 10*3/uL (ref 4.0–10.5)
WBC: 4.1 10*3/uL (ref 4.0–10.5)
nRBC: 0 % (ref 0.0–0.2)
nRBC: 0 % (ref 0.0–0.2)
nRBC: 0 % (ref 0.0–0.2)

## 2021-07-16 LAB — BASIC METABOLIC PANEL
Anion gap: 8 (ref 5–15)
Anion gap: 11 (ref 5–15)
BUN: 14 mg/dL (ref 8–23)
BUN: 12 mg/dL (ref 8–23)
CO2: 28 mmol/L (ref 22–32)
CO2: 20 mmol/L — ABNORMAL LOW (ref 22–32)
Calcium: 9.5 mg/dL (ref 8.9–10.3)
Calcium: 8.2 mg/dL — ABNORMAL LOW (ref 8.9–10.3)
Chloride: 103 mmol/L (ref 98–111)
Chloride: 106 mmol/L (ref 98–111)
Creatinine, Ser: 1.26 mg/dL — ABNORMAL HIGH (ref 0.61–1.24)
Creatinine, Ser: 1.03 mg/dL (ref 0.61–1.24)
GFR, Estimated: 59 mL/min — ABNORMAL LOW (ref >60)
GFR, Estimated: >60 mL/min (ref >60)
Glucose, Bld: 163 mg/dL — ABNORMAL HIGH (ref 70–99)
Glucose, Bld: 145 mg/dL — ABNORMAL HIGH (ref 70–99)
Potassium: 3.8 mmol/L (ref 3.5–5.1)
Potassium: 4.1 mmol/L (ref 3.5–5.1)
Sodium: 139 mmol/L (ref 135–145)
Sodium: 137 mmol/L (ref 135–145)

## 2021-07-16 LAB — GLUCOSE, CAPILLARY
Glucose-Capillary: 128 mg/dL — ABNORMAL HIGH (ref 70–99)
Glucose-Capillary: 118 mg/dL — ABNORMAL HIGH (ref 70–99)
Glucose-Capillary: 133 mg/dL — ABNORMAL HIGH (ref 70–99)
Glucose-Capillary: 125 mg/dL — ABNORMAL HIGH (ref 70–99)
Glucose-Capillary: 126 mg/dL — ABNORMAL HIGH (ref 70–99)
Glucose-Capillary: 134 mg/dL — ABNORMAL HIGH (ref 70–99)
Glucose-Capillary: 161 mg/dL — ABNORMAL HIGH (ref 70–99)
Glucose-Capillary: 196 mg/dL — ABNORMAL HIGH (ref 70–99)
Glucose-Capillary: 179 mg/dL — ABNORMAL HIGH (ref 70–99)

## 2021-07-16 LAB — POCT I-STAT, CHEM 8
BUN: 14 mg/dL (ref 8–23)
BUN: 12 mg/dL (ref 8–23)
BUN: 13 mg/dL (ref 8–23)
BUN: 11 mg/dL (ref 8–23)
BUN: 12 mg/dL (ref 8–23)
BUN: 11 mg/dL (ref 8–23)
Calcium, Ion: 1.25 mmol/L (ref 1.15–1.40)
Calcium, Ion: 1.04 mmol/L — ABNORMAL LOW (ref 1.15–1.40)
Calcium, Ion: 1.27 mmol/L (ref 1.15–1.40)
Calcium, Ion: 1.05 mmol/L — ABNORMAL LOW (ref 1.15–1.40)
Calcium, Ion: 1.04 mmol/L — ABNORMAL LOW (ref 1.15–1.40)
Calcium, Ion: 1.08 mmol/L — ABNORMAL LOW (ref 1.15–1.40)
Chloride: 104 mmol/L (ref 98–111)
Chloride: 103 mmol/L (ref 98–111)
Chloride: 105 mmol/L (ref 98–111)
Chloride: 102 mmol/L (ref 98–111)
Chloride: 102 mmol/L (ref 98–111)
Chloride: 104 mmol/L (ref 98–111)
Creatinine, Ser: 1.1 mg/dL (ref 0.61–1.24)
Creatinine, Ser: 0.9 mg/dL (ref 0.61–1.24)
Creatinine, Ser: 1 mg/dL (ref 0.61–1.24)
Creatinine, Ser: 0.9 mg/dL (ref 0.61–1.24)
Creatinine, Ser: 0.9 mg/dL (ref 0.61–1.24)
Creatinine, Ser: 1 mg/dL (ref 0.61–1.24)
Glucose, Bld: 115 mg/dL — ABNORMAL HIGH (ref 70–99)
Glucose, Bld: 109 mg/dL — ABNORMAL HIGH (ref 70–99)
Glucose, Bld: 97 mg/dL (ref 70–99)
Glucose, Bld: 114 mg/dL — ABNORMAL HIGH (ref 70–99)
Glucose, Bld: 126 mg/dL — ABNORMAL HIGH (ref 70–99)
Glucose, Bld: 151 mg/dL — ABNORMAL HIGH (ref 70–99)
HCT: 39 % (ref 39.0–52.0)
HCT: 29 % — ABNORMAL LOW (ref 39.0–52.0)
HCT: 35 % — ABNORMAL LOW (ref 39.0–52.0)
HCT: 26 % — ABNORMAL LOW (ref 39.0–52.0)
HCT: 26 % — ABNORMAL LOW (ref 39.0–52.0)
HCT: 26 % — ABNORMAL LOW (ref 39.0–52.0)
Hemoglobin: 13.3 g/dL (ref 13.0–17.0)
Hemoglobin: 11.9 g/dL — ABNORMAL LOW (ref 13.0–17.0)
Hemoglobin: 9.9 g/dL — ABNORMAL LOW (ref 13.0–17.0)
Hemoglobin: 8.8 g/dL — ABNORMAL LOW (ref 13.0–17.0)
Hemoglobin: 8.8 g/dL — ABNORMAL LOW (ref 13.0–17.0)
Hemoglobin: 8.8 g/dL — ABNORMAL LOW (ref 13.0–17.0)
Potassium: 4.3 mmol/L (ref 3.5–5.1)
Potassium: 3.9 mmol/L (ref 3.5–5.1)
Potassium: 5.2 mmol/L — ABNORMAL HIGH (ref 3.5–5.1)
Potassium: 4.9 mmol/L (ref 3.5–5.1)
Potassium: 4.8 mmol/L (ref 3.5–5.1)
Potassium: 4.1 mmol/L (ref 3.5–5.1)
Sodium: 139 mmol/L (ref 135–145)
Sodium: 139 mmol/L (ref 135–145)
Sodium: 139 mmol/L (ref 135–145)
Sodium: 136 mmol/L (ref 135–145)
Sodium: 137 mmol/L (ref 135–145)
Sodium: 138 mmol/L (ref 135–145)
TCO2: 27 mmol/L (ref 22–32)
TCO2: 25 mmol/L (ref 22–32)
TCO2: 26 mmol/L (ref 22–32)
TCO2: 26 mmol/L (ref 22–32)
TCO2: 28 mmol/L (ref 22–32)
TCO2: 23 mmol/L (ref 22–32)

## 2021-07-16 LAB — ECHO INTRAOPERATIVE TEE
Height: 72 in
Weight: 4179.2 oz

## 2021-07-16 LAB — MAGNESIUM: Magnesium: 2.9 mg/dL — ABNORMAL HIGH (ref 1.7–2.4)

## 2021-07-16 LAB — POCT I-STAT EG7
Acid-base deficit: 1 mmol/L (ref 0.0–2.0)
Acid-base deficit: 3 mmol/L — ABNORMAL HIGH (ref 0.0–2.0)
Bicarbonate: 24.7 mmol/L (ref 20.0–28.0)
Bicarbonate: 21.2 mmol/L (ref 20.0–28.0)
Calcium, Ion: 1.1 mmol/L — ABNORMAL LOW (ref 1.15–1.40)
Calcium, Ion: 1.17 mmol/L (ref 1.15–1.40)
HCT: 29 % — ABNORMAL LOW (ref 39.0–52.0)
HCT: 27 % — ABNORMAL LOW (ref 39.0–52.0)
Hemoglobin: 9.9 g/dL — ABNORMAL LOW (ref 13.0–17.0)
Hemoglobin: 9.2 g/dL — ABNORMAL LOW (ref 13.0–17.0)
O2 Saturation: 76 %
O2 Saturation: 98 %
Patient temperature: 37.4
Potassium: 4 mmol/L (ref 3.5–5.1)
Potassium: 4.1 mmol/L (ref 3.5–5.1)
Sodium: 142 mmol/L (ref 135–145)
Sodium: 140 mmol/L (ref 135–145)
TCO2: 26 mmol/L (ref 22–32)
TCO2: 22 mmol/L (ref 22–32)
pCO2, Ven: 45.8 mmHg (ref 44.0–60.0)
pCO2, Ven: 36.1 mmHg — ABNORMAL LOW (ref 44.0–60.0)
pH, Ven: 7.34 (ref 7.250–7.430)
pH, Ven: 7.378 (ref 7.250–7.430)
pO2, Ven: 44 mmHg (ref 32.0–45.0)
pO2, Ven: 102 mmHg — ABNORMAL HIGH (ref 32.0–45.0)

## 2021-07-16 LAB — APTT: aPTT: 37 seconds — ABNORMAL HIGH (ref 24–36)

## 2021-07-16 LAB — PROTIME-INR
INR: 1.5 — ABNORMAL HIGH (ref 0.8–1.2)
Prothrombin Time: 17.9 seconds — ABNORMAL HIGH (ref 11.4–15.2)

## 2021-07-16 LAB — HEMOGLOBIN AND HEMATOCRIT, BLOOD
HCT: 25.8 % — ABNORMAL LOW (ref 39.0–52.0)
Hemoglobin: 8.8 g/dL — ABNORMAL LOW (ref 13.0–17.0)

## 2021-07-16 LAB — PLATELET COUNT: Platelets: 98 10*3/uL — ABNORMAL LOW (ref 150–400)

## 2021-07-16 SURGERY — CORONARY ARTERY BYPASS GRAFTING (CABG)
Anesthesia: General | Site: Chest

## 2021-07-16 MED ORDER — LACTATED RINGERS IV SOLN: INTRAVENOUS | Status: DC | PRN

## 2021-07-16 MED ORDER — PLASMA-LYTE A IV SOLN
INTRAVENOUS | Status: DC | PRN
  Administered 2021-07-16: 1000 mL via INTRAVASCULAR

## 2021-07-16 MED ORDER — ALBUMIN HUMAN 5 % IV SOLN
INTRAVENOUS | Status: AC
  Filled 2021-07-16: qty 250

## 2021-07-16 MED ORDER — METOPROLOL TARTRATE 5 MG/5ML IV SOLN
2.5000 mg | INTRAVENOUS | Status: DC | PRN
  Administered 2021-07-19 (×2): 5 mg via INTRAVENOUS
  Administered 2021-07-19: 2.5 mg via INTRAVENOUS
  Administered 2021-07-20 – 2021-07-21 (×2): 5 mg via INTRAVENOUS
  Administered 2021-07-21: 2.5 mg via INTRAVENOUS
  Filled 2021-07-16 (×5): qty 5

## 2021-07-16 MED ORDER — ASPIRIN 81 MG PO CHEW: 324.0000 mg | CHEWABLE_TABLET | Freq: Every day | ORAL | Status: DC

## 2021-07-16 MED ORDER — DEXTROSE 50 % IV SOLN: 0.0000 mL | INTRAVENOUS | Status: DC | PRN

## 2021-07-16 MED ORDER — INSULIN REGULAR(HUMAN) IN NACL 100-0.9 UT/100ML-% IV SOLN
INTRAVENOUS | Status: DC
  Administered 2021-07-17: 5.5 [IU]/h via INTRAVENOUS
  Filled 2021-07-16: qty 100

## 2021-07-16 MED ORDER — ALBUMIN HUMAN 5 % IV SOLN
250.0000 mL | INTRAVENOUS | Status: AC | PRN
Start: 2021-07-16 — End: 2021-07-17
  Administered 2021-07-16 (×2): 12.5 g via INTRAVENOUS

## 2021-07-16 MED ORDER — PHENYLEPHRINE 40 MCG/ML (10ML) SYRINGE FOR IV PUSH (FOR BLOOD PRESSURE SUPPORT)
PREFILLED_SYRINGE | INTRAVENOUS | Status: AC
  Filled 2021-07-16: qty 10

## 2021-07-16 MED ORDER — HEPARIN SODIUM (PORCINE) 1000 UNIT/ML IJ SOLN
INTRAMUSCULAR | Status: AC
  Filled 2021-07-16: qty 1

## 2021-07-16 MED ORDER — VANCOMYCIN HCL IN DEXTROSE 1-5 GM/200ML-% IV SOLN
1000.0000 mg | Freq: Once | INTRAVENOUS | Status: AC
  Administered 2021-07-16: 1000 mg via INTRAVENOUS
  Filled 2021-07-16: qty 200

## 2021-07-16 MED ORDER — ORAL CARE MOUTH RINSE
15.0000 mL | Freq: Two times a day (BID) | OROMUCOSAL | Status: DC
  Administered 2021-07-17: 15 mL via OROMUCOSAL

## 2021-07-16 MED ORDER — TRAMADOL HCL 50 MG PO TABS
50.0000 mg | ORAL_TABLET | ORAL | Status: DC | PRN
  Administered 2021-07-17 – 2021-07-18 (×3): 50 mg via ORAL
  Filled 2021-07-16 (×3): qty 1

## 2021-07-16 MED ORDER — LACTATED RINGERS IV SOLN: 500.0000 mL | Freq: Once | INTRAVENOUS | Status: DC | PRN

## 2021-07-16 MED ORDER — ASPIRIN EC 325 MG PO TBEC
325.0000 mg | DELAYED_RELEASE_TABLET | Freq: Every day | ORAL | Status: DC
  Administered 2021-07-17 – 2021-07-20 (×4): 325 mg via ORAL
  Filled 2021-07-16 (×4): qty 1

## 2021-07-16 MED ORDER — MAGNESIUM SULFATE 4 GM/100ML IV SOLN
4.0000 g | Freq: Once | INTRAVENOUS | Status: AC
  Administered 2021-07-16: 4 g via INTRAVENOUS
  Filled 2021-07-16: qty 100

## 2021-07-16 MED ORDER — SODIUM CHLORIDE 0.45 % IV SOLN: INTRAVENOUS | Status: DC | PRN

## 2021-07-16 MED ORDER — ACETAMINOPHEN 160 MG/5ML PO SOLN: 650.0000 mg | Freq: Once | ORAL | Status: AC

## 2021-07-16 MED ORDER — LACTATED RINGERS IV SOLN: INTRAVENOUS | Status: DC

## 2021-07-16 MED ORDER — ACETAMINOPHEN 650 MG RE SUPP
650.0000 mg | Freq: Once | RECTAL | Status: AC
  Administered 2021-07-16: 650 mg via RECTAL
  Filled 2021-07-16: qty 1

## 2021-07-16 MED ORDER — CHLORHEXIDINE GLUCONATE 0.12% ORAL RINSE (MEDLINE KIT): 15.0000 mL | Freq: Two times a day (BID) | OROMUCOSAL | Status: DC

## 2021-07-16 MED ORDER — AMIODARONE HCL IN DEXTROSE 360-4.14 MG/200ML-% IV SOLN
60.0000 mg/h | INTRAVENOUS | Status: AC
  Administered 2021-07-16: 60 mg/h via INTRAVENOUS
  Filled 2021-07-16 (×2): qty 200

## 2021-07-16 MED ORDER — ALBUMIN HUMAN 5 % IV SOLN: INTRAVENOUS | Status: DC | PRN

## 2021-07-16 MED ORDER — SODIUM CHLORIDE 0.9% FLUSH: 3.0000 mL | INTRAVENOUS | Status: DC | PRN

## 2021-07-16 MED ORDER — SUCCINYLCHOLINE CHLORIDE 200 MG/10ML IV SOSY
PREFILLED_SYRINGE | INTRAVENOUS | Status: DC | PRN
  Administered 2021-07-16: 140 mg via INTRAVENOUS

## 2021-07-16 MED ORDER — FAMOTIDINE 20 MG IN NS 100 ML IVPB
20.0000 mg | Freq: Two times a day (BID) | INTRAVENOUS | Status: AC
  Administered 2021-07-16: 20 mg via INTRAVENOUS
  Filled 2021-07-16: qty 100

## 2021-07-16 MED ORDER — NITROGLYCERIN IN D5W 200-5 MCG/ML-% IV SOLN: 0.0000 ug/min | INTRAVENOUS | Status: DC

## 2021-07-16 MED ORDER — ROCURONIUM BROMIDE 10 MG/ML (PF) SYRINGE
PREFILLED_SYRINGE | INTRAVENOUS | Status: DC | PRN
  Administered 2021-07-16 (×4): 50 mg via INTRAVENOUS

## 2021-07-16 MED ORDER — HEPARIN SODIUM (PORCINE) 1000 UNIT/ML IJ SOLN
INTRAMUSCULAR | Status: DC | PRN
  Administered 2021-07-16: 40000 [IU] via INTRAVENOUS
  Administered 2021-07-16: 2000 [IU] via INTRAVENOUS

## 2021-07-16 MED ORDER — AMIODARONE HCL IN DEXTROSE 360-4.14 MG/200ML-% IV SOLN
30.0000 mg/h | INTRAVENOUS | Status: AC
  Administered 2021-07-16 – 2021-07-20 (×7): 30 mg/h via INTRAVENOUS
  Filled 2021-07-16 (×8): qty 200

## 2021-07-16 MED ORDER — LACTATED RINGERS IV SOLN
INTRAVENOUS | Status: DC
Start: 2021-07-16 — End: 2021-07-20

## 2021-07-16 MED ORDER — PHENYLEPHRINE 40 MCG/ML (10ML) SYRINGE FOR IV PUSH (FOR BLOOD PRESSURE SUPPORT)
PREFILLED_SYRINGE | INTRAVENOUS | Status: DC | PRN
  Administered 2021-07-16: 120 ug via INTRAVENOUS

## 2021-07-16 MED ORDER — SODIUM CHLORIDE 0.9 % IV SOLN: 250.0000 mL | INTRAVENOUS | Status: DC

## 2021-07-16 MED ORDER — ROCURONIUM BROMIDE 10 MG/ML (PF) SYRINGE
PREFILLED_SYRINGE | INTRAVENOUS | Status: AC
  Filled 2021-07-16: qty 10

## 2021-07-16 MED ORDER — ACETAMINOPHEN 500 MG PO TABS
1000.0000 mg | ORAL_TABLET | Freq: Four times a day (QID) | ORAL | Status: AC
  Administered 2021-07-17 – 2021-07-20 (×15): 1000 mg via ORAL
  Filled 2021-07-16 (×13): qty 2

## 2021-07-16 MED ORDER — PROPOFOL 10 MG/ML IV BOLUS
INTRAVENOUS | Status: DC | PRN
  Administered 2021-07-16: 150 mg via INTRAVENOUS

## 2021-07-16 MED ORDER — BISACODYL 5 MG PO TBEC
10.0000 mg | DELAYED_RELEASE_TABLET | Freq: Every day | ORAL | Status: DC
  Administered 2021-07-17 – 2021-07-18 (×2): 10 mg via ORAL
  Filled 2021-07-16 (×3): qty 2

## 2021-07-16 MED ORDER — AMIODARONE HCL IN DEXTROSE 360-4.14 MG/200ML-% IV SOLN
INTRAVENOUS | Status: DC | PRN
  Administered 2021-07-16: 60 mg/h via INTRAVENOUS

## 2021-07-16 MED ORDER — AMIODARONE IV BOLUS ONLY 150 MG/100ML
INTRAVENOUS | Status: DC | PRN
  Administered 2021-07-16: 150 mg via INTRAVENOUS

## 2021-07-16 MED ORDER — SODIUM CHLORIDE 0.9% FLUSH
3.0000 mL | Freq: Two times a day (BID) | INTRAVENOUS | Status: DC
  Administered 2021-07-17 – 2021-07-20 (×6): 3 mL via INTRAVENOUS

## 2021-07-16 MED ORDER — ORAL CARE MOUTH RINSE
15.0000 mL | OROMUCOSAL | Status: DC
  Administered 2021-07-16 (×2): 15 mL via OROMUCOSAL

## 2021-07-16 MED ORDER — HEMOSTATIC AGENTS (NO CHARGE) OPTIME
TOPICAL | Status: DC | PRN
  Administered 2021-07-16: 1 via TOPICAL

## 2021-07-16 MED ORDER — DOCUSATE SODIUM 100 MG PO CAPS
200.0000 mg | ORAL_CAPSULE | Freq: Every day | ORAL | Status: DC
  Administered 2021-07-17 – 2021-07-18 (×2): 200 mg via ORAL
  Filled 2021-07-16 (×3): qty 2

## 2021-07-16 MED ORDER — PHENYLEPHRINE HCL-NACL 20-0.9 MG/250ML-% IV SOLN
0.0000 ug/min | INTRAVENOUS | Status: DC
Start: 2021-07-16 — End: 2021-07-18

## 2021-07-16 MED ORDER — SODIUM CHLORIDE 0.9 % IV SOLN: INTRAVENOUS | Status: DC

## 2021-07-16 MED ORDER — ACETAMINOPHEN 160 MG/5ML PO SOLN
1000.0000 mg | Freq: Four times a day (QID) | ORAL | Status: AC
Start: 2021-07-17 — End: 2021-07-21
  Administered 2021-07-21 (×4): 1000 mg
  Filled 2021-07-16 (×5): qty 40.6

## 2021-07-16 MED ORDER — METOPROLOL TARTRATE 25 MG/10 ML ORAL SUSPENSION: 12.5000 mg | Freq: Two times a day (BID) | ORAL | Status: DC

## 2021-07-16 MED ORDER — MORPHINE SULFATE (PF) 2 MG/ML IV SOLN
1.0000 mg | INTRAVENOUS | Status: DC | PRN
  Administered 2021-07-16 – 2021-07-17 (×2): 2 mg via INTRAVENOUS
  Filled 2021-07-16 (×2): qty 1

## 2021-07-16 MED ORDER — 0.9 % SODIUM CHLORIDE (POUR BTL) OPTIME
TOPICAL | Status: DC | PRN
  Administered 2021-07-16: 1000 mL

## 2021-07-16 MED ORDER — ONDANSETRON HCL 4 MG/2ML IJ SOLN
4.0000 mg | Freq: Four times a day (QID) | INTRAMUSCULAR | Status: DC | PRN
Start: 2021-07-16 — End: 2021-07-25

## 2021-07-16 MED ORDER — FENTANYL CITRATE (PF) 250 MCG/5ML IJ SOLN
INTRAMUSCULAR | Status: DC | PRN
  Administered 2021-07-16 (×5): 100 ug via INTRAVENOUS
  Administered 2021-07-16: 200 ug via INTRAVENOUS
  Administered 2021-07-16: 50 ug via INTRAVENOUS
  Administered 2021-07-16: 100 ug via INTRAVENOUS
  Administered 2021-07-16 (×2): 50 ug via INTRAVENOUS
  Administered 2021-07-16: 100 ug via INTRAVENOUS

## 2021-07-16 MED ORDER — DOBUTAMINE IN D5W 4-5 MG/ML-% IV SOLN
2.5000 ug/kg/min | INTRAVENOUS | Status: DC
  Administered 2021-07-16: 5 ug/kg/min via INTRAVENOUS
  Filled 2021-07-16: qty 250

## 2021-07-16 MED ORDER — SODIUM CHLORIDE 0.9 % IV SOLN: INTRAVENOUS | Status: DC | PRN

## 2021-07-16 MED ORDER — "THROMBI-PAD 3""X3"" EX PADS"
1.0000 | MEDICATED_PAD | Freq: Once | CUTANEOUS | Status: DC
  Filled 2021-07-16: qty 1

## 2021-07-16 MED ORDER — SODIUM CHLORIDE (PF) 0.9 % IJ SOLN
OROMUCOSAL | Status: DC | PRN
  Administered 2021-07-16 (×3): 4 mL via TOPICAL

## 2021-07-16 MED ORDER — OXYCODONE HCL 5 MG PO TABS
5.0000 mg | ORAL_TABLET | ORAL | Status: DC | PRN
  Administered 2021-07-20 – 2021-07-24 (×3): 10 mg via ORAL
  Filled 2021-07-16 (×3): qty 2

## 2021-07-16 MED ORDER — PROTAMINE SULFATE 10 MG/ML IV SOLN
INTRAVENOUS | Status: AC
  Filled 2021-07-16: qty 50

## 2021-07-16 MED ORDER — MIDAZOLAM HCL 2 MG/2ML IJ SOLN: 2.0000 mg | INTRAMUSCULAR | Status: DC | PRN

## 2021-07-16 MED ORDER — DEXMEDETOMIDINE HCL IN NACL 400 MCG/100ML IV SOLN
0.0000 ug/kg/h | INTRAVENOUS | Status: DC
  Administered 2021-07-16: 0.5 ug/kg/h via INTRAVENOUS
  Filled 2021-07-16: qty 100

## 2021-07-16 MED ORDER — CHLORHEXIDINE GLUCONATE 0.12 % MT SOLN
15.0000 mL | OROMUCOSAL | Status: AC
  Administered 2021-07-16: 15 mL via OROMUCOSAL

## 2021-07-16 MED ORDER — CHLORHEXIDINE GLUCONATE CLOTH 2 % EX PADS
6.0000 | MEDICATED_PAD | Freq: Every day | CUTANEOUS | Status: DC
  Administered 2021-07-16 – 2021-07-20 (×5): 6 via TOPICAL

## 2021-07-16 MED ORDER — MIDAZOLAM HCL 5 MG/5ML IJ SOLN
INTRAMUSCULAR | Status: DC | PRN
  Administered 2021-07-16: 2 mg via INTRAVENOUS
  Administered 2021-07-16: 5 mg via INTRAVENOUS
  Administered 2021-07-16: 1 mg via INTRAVENOUS
  Administered 2021-07-16 (×2): 2 mg via INTRAVENOUS

## 2021-07-16 MED ORDER — PROTAMINE SULFATE 10 MG/ML IV SOLN
INTRAVENOUS | Status: DC | PRN
  Administered 2021-07-16: 400 mg via INTRAVENOUS

## 2021-07-16 MED ORDER — METOPROLOL TARTRATE 12.5 MG HALF TABLET
12.5000 mg | ORAL_TABLET | Freq: Two times a day (BID) | ORAL | Status: DC
  Administered 2021-07-17 – 2021-07-20 (×8): 12.5 mg via ORAL
  Filled 2021-07-16 (×8): qty 1

## 2021-07-16 MED ORDER — CEFAZOLIN SODIUM-DEXTROSE 2-4 GM/100ML-% IV SOLN
2.0000 g | Freq: Three times a day (TID) | INTRAVENOUS | Status: AC
  Administered 2021-07-16 – 2021-07-18 (×5): 2 g via INTRAVENOUS
  Filled 2021-07-16 (×7): qty 100

## 2021-07-16 MED ORDER — PROPOFOL 10 MG/ML IV BOLUS
INTRAVENOUS | Status: AC
  Filled 2021-07-16: qty 20

## 2021-07-16 MED ORDER — MIDAZOLAM HCL (PF) 10 MG/2ML IJ SOLN
INTRAMUSCULAR | Status: AC
  Filled 2021-07-16: qty 2

## 2021-07-16 MED ORDER — FENTANYL CITRATE (PF) 250 MCG/5ML IJ SOLN
INTRAMUSCULAR | Status: AC
  Filled 2021-07-16: qty 25

## 2021-07-16 MED ORDER — CALCIUM GLUCONATE 10 % IV SOLN
0.5000 g | Freq: Once | INTRAVENOUS | Status: AC
  Administered 2021-07-16: 0.5 g via INTRAVENOUS
  Filled 2021-07-16: qty 5

## 2021-07-16 MED ORDER — BISACODYL 10 MG RE SUPP: 10.0000 mg | Freq: Every day | RECTAL | Status: DC

## 2021-07-16 MED ORDER — POTASSIUM CHLORIDE 10 MEQ/50ML IV SOLN
10.0000 meq | INTRAVENOUS | Status: AC
Start: 2021-07-16 — End: 2021-07-16

## 2021-07-16 MED ORDER — PANTOPRAZOLE SODIUM 40 MG PO TBEC
40.0000 mg | DELAYED_RELEASE_TABLET | Freq: Every day | ORAL | Status: DC
  Administered 2021-07-18 – 2021-07-25 (×8): 40 mg via ORAL
  Filled 2021-07-16 (×8): qty 1

## 2021-07-16 MED ORDER — SUCCINYLCHOLINE CHLORIDE 200 MG/10ML IV SOSY
PREFILLED_SYRINGE | INTRAVENOUS | Status: AC
  Filled 2021-07-16: qty 10

## 2021-07-16 MED ORDER — MIDAZOLAM HCL 2 MG/2ML IJ SOLN
INTRAMUSCULAR | Status: AC
  Filled 2021-07-16: qty 2

## 2021-07-16 MED ORDER — METOCLOPRAMIDE HCL 5 MG/ML IJ SOLN
10.0000 mg | Freq: Four times a day (QID) | INTRAMUSCULAR | Status: AC
  Administered 2021-07-16 – 2021-07-17 (×3): 10 mg via INTRAVENOUS
  Filled 2021-07-16 (×2): qty 2

## 2021-07-16 SURGICAL SUPPLY — 92 items
ADH SKN CLS APL DERMABOND .7 (GAUZE/BANDAGES/DRESSINGS) ×3
BAG DECANTER FOR FLEXI CONT (MISCELLANEOUS) ×4 IMPLANT
BLADE CLIPPER SURG (BLADE) ×4 IMPLANT
BLADE STERNUM SYSTEM 6 (BLADE) ×4 IMPLANT
BLADE SURG 11 STRL SS (BLADE) ×1 IMPLANT
BNDG CMPR MED 10X6 ELC LF (GAUZE/BANDAGES/DRESSINGS) ×3
BNDG ELASTIC 4X5.8 VLCR STR LF (GAUZE/BANDAGES/DRESSINGS) ×4 IMPLANT
BNDG ELASTIC 6X10 VLCR STRL LF (GAUZE/BANDAGES/DRESSINGS) ×1 IMPLANT
BNDG ELASTIC 6X5.8 VLCR STR LF (GAUZE/BANDAGES/DRESSINGS) ×4 IMPLANT
BNDG GAUZE ELAST 4 BULKY (GAUZE/BANDAGES/DRESSINGS) ×4 IMPLANT
CANISTER SUCT 3000ML PPV (MISCELLANEOUS) ×4 IMPLANT
CANNULA EZ GLIDE 8.0 24FR (CANNULA) ×1 IMPLANT
CANNULA EZ GLIDE AORTIC 21FR (CANNULA) ×4 IMPLANT
CATH CPB KIT HENDRICKSON (MISCELLANEOUS) ×4 IMPLANT
CATH ROBINSON RED A/P 18FR (CATHETERS) ×4 IMPLANT
CATH THORACIC 36FR (CATHETERS) ×4 IMPLANT
CATH THORACIC 36FR RT ANG (CATHETERS) ×4 IMPLANT
CLIP FOGARTY SPRING 6M (CLIP) ×1 IMPLANT
CLIP TI WIDE RED SMALL 24 (CLIP) ×2 IMPLANT
CLIP VESOCCLUDE MED 24/CT (CLIP) IMPLANT
CLIP VESOCCLUDE SM WIDE 24/CT (CLIP) IMPLANT
CONTAINER PROTECT SURGISLUSH (MISCELLANEOUS) ×8 IMPLANT
DEFOGGER ANTIFOG KIT (MISCELLANEOUS) ×1 IMPLANT
DERMABOND ADVANCED (GAUZE/BANDAGES/DRESSINGS) ×1
DERMABOND ADVANCED .7 DNX12 (GAUZE/BANDAGES/DRESSINGS) IMPLANT
DRAPE CARDIOVASCULAR INCISE (DRAPES) ×4
DRAPE SRG 135X102X78XABS (DRAPES) ×3 IMPLANT
DRAPE WARM FLUID 44X44 (DRAPES) ×4 IMPLANT
DRSG COVADERM 4X14 (GAUZE/BANDAGES/DRESSINGS) ×4 IMPLANT
ELECT REM PT RETURN 9FT ADLT (ELECTROSURGICAL) ×8
ELECTRODE REM PT RTRN 9FT ADLT (ELECTROSURGICAL) ×6 IMPLANT
FELT TEFLON 1X6 (MISCELLANEOUS) ×7 IMPLANT
GAUZE SPONGE 4X4 12PLY STRL (GAUZE/BANDAGES/DRESSINGS) ×8 IMPLANT
GLOVE SURG MICRO LTX SZ6 (GLOVE) ×1 IMPLANT
GLOVE SURG MICRO LTX SZ6.5 (GLOVE) ×2 IMPLANT
GLOVE SURG SIGNA 7.5 PF LTX (GLOVE) ×12 IMPLANT
GOWN STRL REUS W/ TWL LRG LVL3 (GOWN DISPOSABLE) ×12 IMPLANT
GOWN STRL REUS W/ TWL XL LVL3 (GOWN DISPOSABLE) ×6 IMPLANT
GOWN STRL REUS W/TWL LRG LVL3 (GOWN DISPOSABLE) ×8
GOWN STRL REUS W/TWL XL LVL3 (GOWN DISPOSABLE) ×28
HEMOSTAT POWDER SURGIFOAM 1G (HEMOSTASIS) ×12 IMPLANT
HEMOSTAT SURGICEL 2X14 (HEMOSTASIS) ×4 IMPLANT
INSERT FOGARTY XLG (MISCELLANEOUS) ×1 IMPLANT
KIT BASIN OR (CUSTOM PROCEDURE TRAY) ×4 IMPLANT
KIT SUCTION CATH 14FR (SUCTIONS) ×8 IMPLANT
KIT TURNOVER KIT B (KITS) ×4 IMPLANT
KIT VASOVIEW HEMOPRO 2 VH 4000 (KITS) ×4 IMPLANT
MARKER GRAFT CORONARY BYPASS (MISCELLANEOUS) ×12 IMPLANT
NS IRRIG 1000ML POUR BTL (IV SOLUTION) ×21 IMPLANT
PACK E OPEN HEART (SUTURE) ×4 IMPLANT
PACK OPEN HEART (CUSTOM PROCEDURE TRAY) ×4 IMPLANT
PAD ARMBOARD 7.5X6 YLW CONV (MISCELLANEOUS) ×8 IMPLANT
PAD ELECT DEFIB RADIOL ZOLL (MISCELLANEOUS) ×4 IMPLANT
PENCIL BUTTON HOLSTER BLD 10FT (ELECTRODE) ×4 IMPLANT
POSITIONER HEAD DONUT 9IN (MISCELLANEOUS) ×4 IMPLANT
PUNCH AORTIC ROTATE 4.0MM (MISCELLANEOUS) ×1 IMPLANT
SET MPS 3-ND DEL (MISCELLANEOUS) ×1 IMPLANT
SPONGE T-LAP 18X18 ~~LOC~~+RFID (SPONGE) ×4 IMPLANT
SPONGE T-LAP 4X18 ~~LOC~~+RFID (SPONGE) ×1 IMPLANT
SUPPORT HEART JANKE-BARRON (MISCELLANEOUS) ×4 IMPLANT
SUT BONE WAX W31G (SUTURE) ×4 IMPLANT
SUT MNCRL AB 4-0 PS2 18 (SUTURE) ×1 IMPLANT
SUT PROLENE 3 0 SH DA (SUTURE) ×4 IMPLANT
SUT PROLENE 4 0 RB 1 (SUTURE) ×8
SUT PROLENE 4 0 SH DA (SUTURE) IMPLANT
SUT PROLENE 4-0 RB1 .5 CRCL 36 (SUTURE) IMPLANT
SUT PROLENE 5 0 C 1 36 (SUTURE) ×1 IMPLANT
SUT PROLENE 6 0 C 1 30 (SUTURE) ×11 IMPLANT
SUT PROLENE 7 0 BV 1 (SUTURE) ×6 IMPLANT
SUT PROLENE 7 0 BV1 MDA (SUTURE) ×5 IMPLANT
SUT PROLENE 8 0 BV175 6 (SUTURE) ×1 IMPLANT
SUT STEEL 6MS V (SUTURE) ×4 IMPLANT
SUT STEEL STERNAL CCS#1 18IN (SUTURE) IMPLANT
SUT STEEL SZ 6 DBL 3X14 BALL (SUTURE) ×4 IMPLANT
SUT VIC AB 1 CTX 36 (SUTURE) ×12
SUT VIC AB 1 CTX36XBRD ANBCTR (SUTURE) ×6 IMPLANT
SUT VIC AB 2-0 CT1 27 (SUTURE) ×4
SUT VIC AB 2-0 CT1 TAPERPNT 27 (SUTURE) IMPLANT
SUT VIC AB 2-0 CTX 27 (SUTURE) IMPLANT
SUT VIC AB 3-0 SH 27 (SUTURE)
SUT VIC AB 3-0 SH 27X BRD (SUTURE) IMPLANT
SUT VIC AB 3-0 X1 27 (SUTURE) IMPLANT
SUT VICRYL 4-0 PS2 18IN ABS (SUTURE) IMPLANT
SYSTEM SAHARA CHEST DRAIN ATS (WOUND CARE) ×4 IMPLANT
TAPE CLOTH SURG 4X10 WHT LF (GAUZE/BANDAGES/DRESSINGS) ×1 IMPLANT
TAPE PAPER 2X10 WHT MICROPORE (GAUZE/BANDAGES/DRESSINGS) ×1 IMPLANT
TOWEL GREEN STERILE (TOWEL DISPOSABLE) ×4 IMPLANT
TOWEL GREEN STERILE FF (TOWEL DISPOSABLE) ×4 IMPLANT
TRAY FOLEY SLVR 16FR TEMP STAT (SET/KITS/TRAYS/PACK) ×4 IMPLANT
TUBING LAP HI FLOW INSUFFLATIO (TUBING) ×4 IMPLANT
UNDERPAD 30X36 HEAVY ABSORB (UNDERPADS AND DIAPERS) ×4 IMPLANT
WATER STERILE IRR 1000ML POUR (IV SOLUTION) ×8 IMPLANT

## 2021-07-16 NOTE — Anesthesia Procedure Notes (Signed)
Central Venous Catheter Insertion Performed by: Audry Pili, MD, anesthesiologist Start/End9/10/2021 6:56 AM, 07/16/2021 7:10 AM Preanesthetic checklist: patient identified, IV checked, risks and benefits discussed, surgical consent, monitors and equipment checked, pre-op evaluation, timeout performed and anesthesia consent Position: Trendelenburg Lidocaine 1% used for infiltration and patient sedated Hand hygiene performed , maximum sterile barriers used  and Seldinger technique used Catheter size: 8.5 Fr Central line was placed.Sheath introducer Procedure performed using ultrasound guided technique. Ultrasound Notes:anatomy identified, needle tip was noted to be adjacent to the nerve/plexus identified, no ultrasound evidence of intravascular and/or intraneural injection and image(s) printed for medical record Attempts: 1 Following insertion, line sutured, dressing applied and Biopatch. Post procedure assessment: blood return through all ports, free fluid flow and no air  Patient tolerated the procedure well with no immediate complications.

## 2021-07-16 NOTE — Progress Notes (Addendum)
EVENING ROUNDS NOTE :     [address]       [place] [address]             [phone]                 Day of Surgery Procedure(s) (LRB): CORONARY ARTERY BYPASS GRAFTING (CABG) X FIVE, USING LEFT INTERNAL MAMMARY ARTERY AND RIGHT LEG GREATER SAPHENOUS VEIN HARVESTED ENDOSCOPICALLY (N/A) TRANSESOPHAGEAL ECHOCARDIOGRAM (TEE) (N/A) APPLICATION OF CELL SAVER ENDOVEIN HARVEST OF GREATER SAPHENOUS VEIN  Total Length of Stay:  LOS: 4 days  BP 107/77 (BP Location: Right Arm)   Pulse 88   Temp (!) 96.6 F (35.9 C) (Oral)   Resp 12   Ht 6' (1.829 m)   Wt 118.5 kg   SpO2 100%   BMI 35.43 kg/m   .Intake/Output      09/11 0701 09/12 0700 09/12 0701 09/13 0700   P.O. 700    I.V. (mL/kg)  2561.9 (21.6)   Blood  270   IV Piggyback  1098   Total Intake(mL/kg) 700 (5.9) 3929.9 (33.2)   Urine (mL/kg/hr) 3150 (1.1) 2025 (1.8)   Stool     Chest Tube  50   Total Output 3150 2075   Net -2450 +1854.9           sodium chloride Stopped (07/16/21 1446)   [START ON 07/17/2021] sodium chloride     sodium chloride 20 mL/hr at 07/16/21 1448   albumin human 12.5 g (07/16/21 1553)   amiodarone 60 mg/hr (07/16/21 1549)   Followed by   amiodarone      ceFAZolin (ANCEF) IV     dexmedetomidine (PRECEDEX) IV infusion 0.5 mcg/kg/hr (07/16/21 1601)   DOBUTamine 5 mcg/kg/min (07/16/21 1601)   insulin 2.4 Units/hr (07/16/21 1500)   lactated ringers     lactated ringers Stopped (07/16/21 1544)   lactated ringers 20 mL/hr at 07/16/21 1500   magnesium sulfate 20 mL/hr at 07/16/21 1500   nitroGLYCERIN 0 mcg/min (07/16/21 1415)   phenylephrine (NEO-SYNEPHRINE) Adult infusion 10 mcg/min (07/16/21 1500)   potassium chloride     vancomycin       Lab Results  Component Value Date   WBC 5.5 07/16/2021   HGB 9.6 (L) 07/16/2021   HCT 28.9 (L) 07/16/2021   PLT 94 (L) 07/16/2021   GLUCOSE 151 (H) 07/16/2021   ALT 28 07/15/2021   AST 23 07/15/2021   NA 138 07/16/2021   K 4.1  07/16/2021   CL 104 07/16/2021   CREATININE 1.00 07/16/2021   BUN 11 07/16/2021   CO2 28 07/16/2021   INR 1.5 (H) 07/16/2021   HGBA1C 5.3 07/15/2021  A paced at 90 On Dobutamine-decreased to 2.5 mcg/kg/min, Neo Synephrine, Amiodarone, Insulin, and Precedex. CO/CI 6.4/2.7.Follow up on ABGs and hope to wean to extubate over next few hours, if meets criteria    Lars Pinks PA-C 07/16/2021 4:23 PM   CXR showed Swan coiled in PA. Repositioned without difficulty  Revonda Standard. Roxan Hockey, MD [facility] [phone]

## 2021-07-16 NOTE — Hospital Course (Addendum)
History of Present Illness:   [name]  is a 75 yo male with multiple comorbidities and cardiac risk factors including moderate obesity, dyslipidemia, hypertension and type 2 diabetes mellitus.  He is also noted to have a strong family history of heart disease with his father passing from a myocardial infarction at age 69 and a brother deceased from myocardial infarction at age 48.  He is a remote smoker with  use for approximately 10 years.  He quit in 1975.  The patient describes a history of recent increase in palpitations and saw his cardiologist who did an EKG which revealed he was having PACs.  He was started on metoprolol 25 mg twice daily with notable improvement in his symptoms.  He noted improvement in both heart rate as well as blood pressure.  The patient initially denied  chest pain but does note he gets short of breath and diaphoretic very easily with minimal exertion.  This has shown progressive worsening recently.  He additionally now has some chest pressure symptoms.  It has been particularly true for the past approximate 2 weeks.  Additionally he notes recent increased fatigability. He did have Covid in July .  He has  history of cardiology evaluation in the early 2000's including catheterization in 2002 which at that time showed normal LV systolic function and three-vessel nonobstructive coronary disease.  He was treated medically and followed over the years.  He again was seen recently for these symptoms and EKG revealed sinus rhythm with PACs and left anterior fascicular block.  There was voltage criteria for LVH and lateral T wave inversions concerning for ischemia.  A CT angiogram was performed on 06/28/2021 and the quality was felt to be suboptimal however his calcium score was significantly elevated at 2381 which was 34 percentile for age and sex matched controls.  Coronary arteries were felt to be diffusely diseased with severe stenosis in the right coronary artery.  LAD and circumflex  arteries also showed significant disease.  Cardiac catheterization was recommended which he underwent on today's date.  He is found to have severe three-vessel coronary artery disease.  Cardiothoracic consultation was requested.  Hospital Course:  The patient was evaluated by Dr. Roxan Hockey who felt coronary bypass grafting would be indicated.  The patient was agreeable to proceed.  He remained chest pain free prior to surgery.  He was taken to the operating room on 07/16/2021 and underwent CABG x 5 utilizing LIMA to LAD, SVG to DIAG, SVG to PDA, SEQ SVG OM2 and OM3.  He also underwent endoscopic harvest of greater saphenous vein from her right leg.  On postop day 1 he was noted to be doing well.  He is in sinus rhythm and his dobutamine was weaned as hemodynamics allowed.  He underwent debridement of chest tubes and arterial lines and catheters. He has been started on aspirin, beta-blocker and statin.  Chest x-ray does show bibasilar atelectasis and he is doing routine pulmonary toilet.  He does show some postoperative volume overload and is begun on a course of diuresis.  Blood sugars have been controlled using insulin drip and will be transitioned using standard measures with ultimate plan to resume metformin once creatinine normalizes. He does have an expected acute blood loss anemia which is stable and is being monitored clinically.  He developed Rapid Atrial Fibrillation/Flutter.  He was treated with Amiodarone bolus and drip therapy.  Rapid atrial pacing was attempted for Atrial flutter and was ultimately unsuccessful.  He required additional Amiodarone  boluses.  He does have an expected acute blood loss anemia which is stable and is being monitored clinically.  Rapid atrial fibrillation persisted on post-op day 3 and was treated with and additional amiodarone bolus and digoxin loading. Potassium was supplemented as required and the metoprolol was also up-titrated.  He converted to NSR with PACs.  He was  transitioned to oral Amiodarone.  He developed a mild elevation in his creatinine likely due to volume overloaded state, diuretics were continued.  He was medically stable for transfer to the progressive care unit on 07/20/2021 but remained in the ICU due to bed shortage is in the stepdown unit.  He continued to have paroxysmal atrial fibrillation.  He was anticoagulated with Eliquis.He did require some additional titration of beta blocker for increased HR at times. He also required some further diuresis for volume overload. Most recent creatinine is 1.44.  Oxygen has been weaned and he maintains good saturations on room air.  Incisions are healing well without evidence of infection.  He is tolerating routine cardiac rehab modalities.  Overall, at the time of discharge the patient is felt to be quite stable.

## 2021-07-16 NOTE — Anesthesia Procedure Notes (Addendum)
Arterial Line Insertion Start/End9/10/2021 6:50 AM, 07/16/2021 6:55 AM Performed by: Belinda Block, MD, Lowella Dell, CRNA, CRNA  Patient location: Pre-op. Preanesthetic checklist: patient identified, IV checked, site marked, risks and benefits discussed, surgical consent, monitors and equipment checked, pre-op evaluation, timeout performed and anesthesia consent Lidocaine 1% used for infiltration Left, radial was placed Catheter size: 20 G Hand hygiene performed  and maximum sterile barriers used   Attempts: 2 Procedure performed without using ultrasound guided technique. Following insertion, Biopatch and dressing applied. Post procedure assessment: unchanged  Patient tolerated the procedure well with no immediate complications. Additional procedure comments: Inserted by Everlene Other SRNA.

## 2021-07-16 NOTE — Plan of Care (Signed)
  Problem: Clinical Measurements: Goal: Respiratory complications will improve Outcome: Completed/Met   Problem: Clinical Measurements: Goal: Diagnostic test results will improve Outcome: Completed/Met   Problem: Clinical Measurements: Goal: Will remain free from infection Outcome: Completed/Met   Problem: Clinical Measurements: Goal: Ability to maintain clinical measurements within normal limits will improve Outcome: Completed/Met

## 2021-07-16 NOTE — Interval H&P Note (Signed)
History and Physical Interval Note:  07/16/2021 7:10 AM  Oscar [name]  has presented today for surgery, with the diagnosis of CAD.  The various methods of treatment have been discussed with the patient and family. After consideration of risks, benefits and other options for treatment, the patient has consented to  Procedure(s): CORONARY ARTERY BYPASS GRAFTING (CABG) (N/A) TRANSESOPHAGEAL ECHOCARDIOGRAM (TEE) (N/A) as a surgical intervention.  The patient's history has been reviewed, patient examined, no change in status, stable for surgery.  I have reviewed the patient's chart and labs.  Questions were answered to the patient's satisfaction.     Melrose Nakayama

## 2021-07-16 NOTE — Anesthesia Procedure Notes (Signed)
Procedure Name: Intubation Date/Time: 07/16/2021 7:43 AM Performed by: Lowella Dell, CRNA Pre-anesthesia Checklist: Patient identified, Emergency Drugs available, Suction available and Patient being monitored Patient Re-evaluated:Patient Re-evaluated prior to induction Oxygen Delivery Method: Circle system utilized Preoxygenation: Pre-oxygenation with 100% oxygen Induction Type: IV induction Ventilation: Mask ventilation without difficulty Laryngoscope Size: Glidescope and 4 Grade View: Grade I Tube type: Oral Tube size: 8.0 mm Number of attempts: 1 Airway Equipment and Method: Stylet and Oral airway Placement Confirmation: ETT inserted through vocal cords under direct vision, positive ETCO2 and breath sounds checked- equal and bilateral Secured at: 23 cm Tube secured with: Tape Dental Injury: Teeth and Oropharynx as per pre-operative assessment  Difficulty Due To: Difficulty was anticipated Comments: Prev hx of difficult intubation, glidescope used. Intubated by Everlene Other SRNA

## 2021-07-16 NOTE — Anesthesia Procedure Notes (Signed)
Central Venous Catheter Insertion Performed by: Audry Pili, MD, anesthesiologist Start/End9/10/2021 7:05 AM, 07/16/2021 7:10 AM Patient location: Pre-op. Preanesthetic checklist: patient identified, IV checked, risks and benefits discussed, surgical consent, monitors and equipment checked, pre-op evaluation, timeout performed and anesthesia consent Position: Trendelenburg Hand hygiene performed  and maximum sterile barriers used  Total catheter length 10. PA cath was placed.Swan type:thermodilution PA Cath depth:55 Procedure performed without using ultrasound guided technique. Attempts: 1 Patient tolerated the procedure well with no immediate complications.

## 2021-07-16 NOTE — Brief Op Note (Signed)
07/12/2021 - 07/16/2021  7:08 AM  PATIENT:  Oscar [name]  75 y.o. male  PRE-OPERATIVE DIAGNOSIS:  coronary artery disease  POST-OPERATIVE DIAGNOSIS:  coronary artery disease  PROCEDURE:  Procedure(s):  CORONARY ARTERY BYPASS GRAFTING x 5 -LIMA to LAD -SVG to DIAG -SVG to PDA -SEQ SVG to OM 2 and OM 3  ENDOVEIN HARVEST OF GREATER SAPHENOUS VEIN -right leg (40/20)  TRANSESOPHAGEAL ECHOCARDIOGRAM (TEE) (N/A)  APPLICATION OF CELL SAVER  SURGEON:  Surgeon(s) and Role:    Melrose Nakayama, MD - Primary  PHYSICIAN ASSISTANT: Ellwood Handler PA-C  ASSISTANTS: Ara Kussmaul RNFA, Chelsea Primus   ANESTHESIA:   general  EBL:  per anesthesia record  BLOOD ADMINISTERED:   CC CELLSAVER  DRAINS:  Left Pleural Chest Tubes, Mediastinal Chest Drains    LOCAL MEDICATIONS USED:  BUPIVICAINE   SPECIMEN:  No Specimen  DISPOSITION OF SPECIMEN:  N/A  COUNTS:  YES  TOURNIQUET:  * No tourniquets in log *  DICTATION: .Dragon Dictation  PLAN OF CARE: Admit to inpatient   PATIENT DISPOSITION:  ICU - intubated and hemodynamically stable.   Delay start of Pharmacological VTE agent (>24hrs) due to surgical blood loss or risk of bleeding: yes

## 2021-07-16 NOTE — Transfer of Care (Signed)
Immediate Anesthesia Transfer of Care Note  Patient: Oscar [name]  Procedure(s) Performed: CORONARY ARTERY BYPASS GRAFTING (CABG) X FIVE, USING LEFT INTERNAL MAMMARY ARTERY AND RIGHT LEG GREATER SAPHENOUS VEIN HARVESTED ENDOSCOPICALLY (Chest) TRANSESOPHAGEAL ECHOCARDIOGRAM (TEE) APPLICATION OF CELL SAVER ENDOVEIN HARVEST OF GREATER SAPHENOUS VEIN  Patient Location: SICU  Anesthesia Type:General  Level of Consciousness: sedated and Patient remains intubated per anesthesia plan  Airway & Oxygen Therapy: Patient remains intubated per anesthesia plan and Patient placed on Ventilator (see vital sign flow sheet for setting)  Post-op Assessment: Report given to RN and Post -op Vital signs reviewed and stable  Post vital signs: Reviewed and stable  Last Vitals:  Vitals Value Taken Time  BP 106/81 07/16/21 1417  Temp 36 C 07/16/21 1422  Pulse 89 07/16/21 1422  Resp 14 07/16/21 1422  SpO2 97 % 07/16/21 1422  Vitals shown include unvalidated device data.  Last Pain:  Vitals:   07/16/21 0454  TempSrc: Oral  PainSc:          Complications: No notable events documented.

## 2021-07-16 NOTE — Procedures (Signed)
Extubation Procedure Note  Patient Details:   Name: Oscar [name] DOB: 1945-11-19 MRN: [id number]   Airway Documentation:    Vent end date: 07/16/21 Vent end time: 2000   Evaluation  O2 sats: stable throughout Complications: No apparent complications Patient did tolerate procedure well. Bilateral Breath Sounds: Clear, Diminished   Yes  Graciella Freer 07/16/2021, 8:02 PM   Pt extubated per rapid wean protocol, pt able to perform NIF of -26 and VC of 660 ml. Pt had positive cuff leak and is on 4 L [place] at this time.

## 2021-07-16 NOTE — Anesthesia Postprocedure Evaluation (Signed)
Anesthesia Post Note  Patient: Oscar [name]  Procedure(s) Performed: CORONARY ARTERY BYPASS GRAFTING (CABG) X FIVE, USING LEFT INTERNAL MAMMARY ARTERY AND RIGHT LEG GREATER SAPHENOUS VEIN HARVESTED ENDOSCOPICALLY (Chest) TRANSESOPHAGEAL ECHOCARDIOGRAM (TEE) APPLICATION OF CELL SAVER ENDOVEIN HARVEST OF GREATER SAPHENOUS VEIN     Patient location during evaluation: SICU Anesthesia Type: General Level of consciousness: patient remains intubated per anesthesia plan Pain management: pain level controlled Vital Signs Assessment: post-procedure vital signs reviewed and stable Respiratory status: patient remains intubated per anesthesia plan Cardiovascular status: stable Postop Assessment: no apparent nausea or vomiting Anesthetic complications: no   No notable events documented.  Last Vitals:  Vitals:   07/16/21 0721 07/16/21 1413  BP: 123/79 119/73  Pulse: (!) 59 90  Resp: 18 12  Temp:    SpO2: 98% 97%    Last Pain:  Vitals:   07/16/21 0454  TempSrc: Oral  PainSc:                  Oscar [name]

## 2021-07-17 ENCOUNTER — Encounter (HOSPITAL_COMMUNITY): Payer: Self-pay | Admitting: Thoracic Surgery (Cardiothoracic Vascular Surgery)

## 2021-07-17 ENCOUNTER — Inpatient Hospital Stay (HOSPITAL_COMMUNITY): Payer: Medicare PPO

## 2021-07-17 LAB — GLUCOSE, CAPILLARY
Glucose-Capillary: 115 mg/dL — ABNORMAL HIGH (ref 70–99)
Glucose-Capillary: 115 mg/dL — ABNORMAL HIGH (ref 70–99)
Glucose-Capillary: 119 mg/dL — ABNORMAL HIGH (ref 70–99)
Glucose-Capillary: 121 mg/dL — ABNORMAL HIGH (ref 70–99)
Glucose-Capillary: 146 mg/dL — ABNORMAL HIGH (ref 70–99)
Glucose-Capillary: 148 mg/dL — ABNORMAL HIGH (ref 70–99)
Glucose-Capillary: 151 mg/dL — ABNORMAL HIGH (ref 70–99)
Glucose-Capillary: 153 mg/dL — ABNORMAL HIGH (ref 70–99)
Glucose-Capillary: 157 mg/dL — ABNORMAL HIGH (ref 70–99)
Glucose-Capillary: 159 mg/dL — ABNORMAL HIGH (ref 70–99)
Glucose-Capillary: 159 mg/dL — ABNORMAL HIGH (ref 70–99)
Glucose-Capillary: 163 mg/dL — ABNORMAL HIGH (ref 70–99)
Glucose-Capillary: 167 mg/dL — ABNORMAL HIGH (ref 70–99)
Glucose-Capillary: 168 mg/dL — ABNORMAL HIGH (ref 70–99)
Glucose-Capillary: 170 mg/dL — ABNORMAL HIGH (ref 70–99)
Glucose-Capillary: 192 mg/dL — ABNORMAL HIGH (ref 70–99)
Glucose-Capillary: 203 mg/dL — ABNORMAL HIGH (ref 70–99)
Glucose-Capillary: 218 mg/dL — ABNORMAL HIGH (ref 70–99)

## 2021-07-17 LAB — CBC
HCT: 27.5 % — ABNORMAL LOW (ref 39.0–52.0)
HCT: 27.6 % — ABNORMAL LOW (ref 39.0–52.0)
Hemoglobin: 9.5 g/dL — ABNORMAL LOW (ref 13.0–17.0)
Hemoglobin: 9.2 g/dL — ABNORMAL LOW (ref 13.0–17.0)
MCH: 31.8 pg (ref 26.0–34.0)
MCH: 31.1 pg (ref 26.0–34.0)
MCHC: 34.5 g/dL (ref 30.0–36.0)
MCHC: 33.3 g/dL (ref 30.0–36.0)
MCV: 92 fL (ref 80.0–100.0)
MCV: 93.2 fL (ref 80.0–100.0)
Platelets: 80 10*3/uL — ABNORMAL LOW (ref 150–400)
Platelets: 92 10*3/uL — ABNORMAL LOW (ref 150–400)
RBC: 2.99 MIL/uL — ABNORMAL LOW (ref 4.22–5.81)
RBC: 2.96 MIL/uL — ABNORMAL LOW (ref 4.22–5.81)
RDW: 12.7 % (ref 11.5–15.5)
RDW: 12.9 % (ref 11.5–15.5)
WBC: 5.3 10*3/uL (ref 4.0–10.5)
WBC: 7.9 10*3/uL (ref 4.0–10.5)
nRBC: 0 % (ref 0.0–0.2)
nRBC: 0 % (ref 0.0–0.2)

## 2021-07-17 LAB — BASIC METABOLIC PANEL
Anion gap: 7 (ref 5–15)
Anion gap: 11 (ref 5–15)
BUN: 11 mg/dL (ref 8–23)
BUN: 13 mg/dL (ref 8–23)
CO2: 22 mmol/L (ref 22–32)
CO2: 21 mmol/L — ABNORMAL LOW (ref 22–32)
Calcium: 8.3 mg/dL — ABNORMAL LOW (ref 8.9–10.3)
Calcium: 8.4 mg/dL — ABNORMAL LOW (ref 8.9–10.3)
Chloride: 105 mmol/L (ref 98–111)
Chloride: 102 mmol/L (ref 98–111)
Creatinine, Ser: 1.18 mg/dL (ref 0.61–1.24)
Creatinine, Ser: 1.37 mg/dL — ABNORMAL HIGH (ref 0.61–1.24)
GFR, Estimated: >60 mL/min (ref >60)
GFR, Estimated: 54 mL/min — ABNORMAL LOW (ref >60)
Glucose, Bld: 156 mg/dL — ABNORMAL HIGH (ref 70–99)
Glucose, Bld: 108 mg/dL — ABNORMAL HIGH (ref 70–99)
Potassium: 3.8 mmol/L (ref 3.5–5.1)
Potassium: 3.8 mmol/L (ref 3.5–5.1)
Sodium: 134 mmol/L — ABNORMAL LOW (ref 135–145)
Sodium: 134 mmol/L — ABNORMAL LOW (ref 135–145)

## 2021-07-17 LAB — MAGNESIUM
Magnesium: 2.3 mg/dL (ref 1.7–2.4)
Magnesium: 2.4 mg/dL (ref 1.7–2.4)

## 2021-07-17 MED ORDER — INSULIN ASPART 100 UNIT/ML IJ SOLN
0.0000 [IU] | INTRAMUSCULAR | Status: DC
  Administered 2021-07-17 (×2): 2 [IU] via SUBCUTANEOUS
  Administered 2021-07-18: 4 [IU] via SUBCUTANEOUS
  Administered 2021-07-18 (×3): 2 [IU] via SUBCUTANEOUS
  Administered 2021-07-18: 4 [IU] via SUBCUTANEOUS
  Administered 2021-07-19: 2 [IU] via SUBCUTANEOUS
  Administered 2021-07-19: 4 [IU] via SUBCUTANEOUS
  Administered 2021-07-20: 2 [IU] via SUBCUTANEOUS
  Administered 2021-07-20: 4 [IU] via SUBCUTANEOUS
  Administered 2021-07-20 – 2021-07-21 (×2): 2 [IU] via SUBCUTANEOUS
  Administered 2021-07-21: 4 [IU] via SUBCUTANEOUS

## 2021-07-17 MED ORDER — INSULIN DETEMIR 100 UNIT/ML ~~LOC~~ SOLN
25.0000 [IU] | Freq: Two times a day (BID) | SUBCUTANEOUS | Status: DC
  Administered 2021-07-17: 25 [IU] via SUBCUTANEOUS
  Filled 2021-07-17 (×4): qty 0.25

## 2021-07-17 MED ORDER — INSULIN DETEMIR 100 UNIT/ML ~~LOC~~ SOLN
25.0000 [IU] | Freq: Two times a day (BID) | SUBCUTANEOUS | Status: DC
  Administered 2021-07-18 – 2021-07-20 (×5): 25 [IU] via SUBCUTANEOUS
  Filled 2021-07-17 (×6): qty 0.25

## 2021-07-17 MED ORDER — FUROSEMIDE 10 MG/ML IJ SOLN
40.0000 mg | Freq: Once | INTRAMUSCULAR | Status: AC
  Administered 2021-07-17: 40 mg via INTRAVENOUS
  Filled 2021-07-17: qty 4

## 2021-07-17 MED ORDER — POTASSIUM CHLORIDE 10 MEQ/50ML IV SOLN
10.0000 meq | INTRAVENOUS | Status: AC
  Administered 2021-07-17 (×4): 10 meq via INTRAVENOUS
  Filled 2021-07-17: qty 50

## 2021-07-17 NOTE — Progress Notes (Addendum)
EVENING ROUNDS NOTE :     [facility]       [place] [address]             [phone]                 1 Day Post-Op Procedure(s) (LRB): CORONARY ARTERY BYPASS GRAFTING (CABG) X FIVE, USING LEFT INTERNAL MAMMARY ARTERY AND RIGHT LEG GREATER SAPHENOUS VEIN HARVESTED ENDOSCOPICALLY (N/A) TRANSESOPHAGEAL ECHOCARDIOGRAM (TEE) (N/A) APPLICATION OF CELL SAVER ENDOVEIN HARVEST OF GREATER SAPHENOUS VEIN  Total Length of Stay:  LOS: 5 days  BP 105/74   Pulse 63   Temp 99 F (37.2 C)   Resp (!) 24   Ht 6' (1.829 m)   Wt 125.6 kg   SpO2 98%   BMI 37.55 kg/m   .Intake/Output      09/12 0701 09/13 0700 09/13 0701 09/14 0700   P.O.  240   I.V. (mL/kg) 3759 (29.9) 304 (2.4)   Blood 270    IV Piggyback 1550.3 299.9   Total Intake(mL/kg) 5579.3 (44.4) 843.9 (6.7)   Urine (mL/kg/hr) 3360 (1.1) 175 (0.1)   Chest Tube 390 90   Total Output 3750 265   Net +1829.3 +578.9           sodium chloride 10 mL/hr at 07/17/21 1500   sodium chloride Stopped (07/17/21 0833)   sodium chloride 20 mL/hr at 07/16/21 1448   amiodarone 30 mg/hr (07/17/21 1500)    ceFAZolin (ANCEF) IV Stopped (07/17/21 1445)   dexmedetomidine (PRECEDEX) IV infusion Stopped (07/16/21 1654)   DOBUTamine Stopped (07/17/21 0848)   insulin 6 Units/hr (07/17/21 1500)   lactated ringers     lactated ringers Stopped (07/16/21 1544)   lactated ringers 10 mL/hr at 07/17/21 1500   nitroGLYCERIN Stopped (07/16/21 1819)   phenylephrine (NEO-SYNEPHRINE) Adult infusion Stopped (07/16/21 1730)     Lab Results  Component Value Date   WBC 5.3 07/17/2021   HGB 9.5 (L) 07/17/2021   HCT 27.5 (L) 07/17/2021   PLT 80 (L) 07/17/2021   GLUCOSE 156 (H) 07/17/2021   ALT 28 07/15/2021   AST 23 07/15/2021   NA 134 (L) 07/17/2021   K 3.8 07/17/2021   CL 105 07/17/2021   CREATININE 1.18 07/17/2021   BUN 11 07/17/2021   CO2 22 07/17/2021   INR 1.5 (H) 07/16/2021   HGBA1C 5.3 07/15/2021    Off milrinone and  neo- aline out Getting ready to dangle and pull CT's Routine progression/steady progress  John Giovanni, PA-C   Agree with above Doing well [place]

## 2021-07-17 NOTE — Progress Notes (Signed)
1 Day Post-Op Procedure(s) (LRB): CORONARY ARTERY BYPASS GRAFTING (CABG) X FIVE, USING LEFT INTERNAL MAMMARY ARTERY AND RIGHT LEG GREATER SAPHENOUS VEIN HARVESTED ENDOSCOPICALLY (N/A) TRANSESOPHAGEAL ECHOCARDIOGRAM (TEE) (N/A) APPLICATION OF CELL SAVER ENDOVEIN HARVEST OF GREATER SAPHENOUS VEIN Subjective: Some incisional pain  Objective: Vital signs in last 24 hours: Temp:  [96.6 F (35.9 C)-100.2 F (37.9 C)] 99.3 F (37.4 C) (09/13 0700) Pulse Rate:  [59-90] 77 (09/13 0700) Cardiac Rhythm: (P) Atrial paced (09/13 0000) Resp:  [12-30] 23 (09/13 0700) BP: (90-121)/(58-91) 98/59 (09/13 0700) SpO2:  [68 %-100 %] 96 % (09/13 0700) Arterial Line BP: (104-186)/(42-84) 120/47 (09/13 0700) FiO2 (%):  [40 %-50 %] 40 % (09/12 1926) Weight:  [125.6 kg] 125.6 kg (09/13 0600)  Hemodynamic parameters for last 24 hours: PAP: (24-42)/(9-22) 29/14 CO:  [1.3 L/min-6.5 L/min] 6 L/min CI:  [1.4 L/min/m2-5.8 L/min/m2] 2.5 L/min/m2  Intake/Output from previous day: 09/12 0701 - 09/13 0700 In: 5579.3 [I.V.:3759; Blood:270; IV Piggyback:1550.3] Out: [id number] [Urine:3360; Chest Tube:390] Intake/Output this shift: No intake/output data recorded.  General appearance: alert, cooperative, and no distress Neurologic: intact Heart: regular rate and rhythm Lungs: diminished breath sounds bibasilar Abdomen: soft, nontender, hypoactive BS  Lab Results: Recent Labs    07/16/21 2000 07/16/21 2103 07/17/21 0315  WBC 4.1  --  5.3  HGB 9.4* 8.8* 9.5*  HCT 29.0* 26.0* 27.5*  PLT 76*  --  80*   BMET:  Recent Labs    07/16/21 2000 07/16/21 2103 07/17/21 0315  NA 137 140 134*  K 4.1 4.0 3.8  CL 106  --  105  CO2 20*  --  22  GLUCOSE 145*  --  156*  BUN 12  --  11  CREATININE 1.03  --  1.18  CALCIUM 8.2*  --  8.3*    PT/INR:  Recent Labs    07/16/21 1411  LABPROT 17.9*  INR 1.5*   ABG    Component Value Date/Time   PHART 7.360 07/16/2021 2103   HCO3 20.7 07/16/2021 2103   TCO2 22  07/16/2021 2103   ACIDBASEDEF 4.0 (H) 07/16/2021 2103   O2SAT 98.0 07/16/2021 2103   CBG (last 3)  Recent Labs    07/17/21 0421 07/17/21 0519 07/17/21 0621  GLUCAP 151* 153* 115*    Assessment/Plan: S/P Procedure(s) (LRB): CORONARY ARTERY BYPASS GRAFTING (CABG) X FIVE, USING LEFT INTERNAL MAMMARY ARTERY AND RIGHT LEG GREATER SAPHENOUS VEIN HARVESTED ENDOSCOPICALLY (N/A) TRANSESOPHAGEAL ECHOCARDIOGRAM (TEE) (N/A) APPLICATION OF CELL SAVER ENDOVEIN HARVEST OF GREATER SAPHENOUS VEIN - POD # 1 CV- in SR  Good hemodynamics- wean dobutamine, dc Swan and A line  ASA, beta blocker, statin RESP- bibasilar atelectasis RENAL- creatinine and lytes OK  Diurese ENDO- CBG controlled with insulin drip  Transition to levemir + SSI  Restart metformin tomorrow Anemia secondary to ABL- follow Thrombocytopenia- no bleeding issues. Monitor, no heparin SCD for DVT prophylaxis Dc chest tubes  Mobilize   LOS: 5 days    Oscar [name] 07/17/2021

## 2021-07-17 NOTE — Plan of Care (Signed)
  Problem: Education: Goal: Will demonstrate proper wound care and an understanding of methods to prevent future damage Outcome: Progressing   Problem: Activity: Goal: Risk for activity intolerance will decrease Outcome: Progressing   Problem: Cardiac: Goal: Will achieve and/or maintain hemodynamic stability Outcome: Progressing   Problem: Respiratory: Goal: Respiratory status will improve Outcome: Progressing

## 2021-07-17 NOTE — Op Note (Signed)
NAMEBRIGGSTON, STIGGERS MEDICAL RECORD NO: [id number] ACCOUNT NO: [id number] DATE OF BIRTH: 08-Jan-1946 FACILITY: [facility] LOCATION: [facility] PHYSICIAN: Revonda Standard. Roxan Hockey, MD  Operative Report   DATE OF PROCEDURE: 07/16/2021  PREOPERATIVE DIAGNOSIS:  Three-vessel coronary artery disease.  POSTOPERATIVE DIAGNOSIS:  Three-vessel coronary artery disease.  PROCEDURES PERFORMED:   Median sternotomy, extracorporeal circulation,  Coronary artery bypass grafting x 5  Left internal mammary artery to LAD,  Saphenous vein graft to first diagonal,  Saphenous vein graft to posterior descending, Sequential saphenous vein graft to obtuse marginal 2 and 3   Endoscopic vein harvest, right leg.  SURGEON:  Revonda Standard. Roxan Hockey, MD  ASSISTANT:  Ellwood Handler, PA  ANESTHESIA:  General.  FINDINGS:  Transesophageal echocardiography showed ejection fraction of approximately 50% with no significant valvular pathology, unchanged post-bypass.  Vein fair quality, mammary good quality targets, diffusely diseased heavily calcified fair quality  targets, except OM3 was a poor quality target.  CLINICAL NOTE:  Mr. Mayr is a 75 year old gentleman with multiple cardiac risk factors, who presented with accelerating angina.  He was found to have 3-vessel disease at catheterization.  He was referred for coronary artery bypass grafting.  The  indications, risks, benefits, and alternatives were discussed in detail with the patient.  He understood and accepts the risks and agreed to proceed.  DESCRIPTION OF PROCEDURE:  Mr. Delconte was brought to the preoperative holding area on 07/16/2021.  Anesthesia placed a Swan-Ganz catheter and an arterial blood pressure monitoring catheter.  He was taken to the operating room and anesthetized and  intubated.  Intravenous antibiotics were administered.  A Foley catheter was placed.  Sequential compression devices were placed.  A transesophageal echocardiography was performed by Dr. Nyoka Cowden.   Please see her separately dictated note for full details.  Findings as  noted above.  The chest, abdomen and legs were prepped and draped in the usual sterile fashion.  A timeout was performed.  An incision was made in the medial aspect of the right leg at the level of the knee.  The greater saphenous vein was harvested from the mid calf to the groin endoscopically.  Saphenous vein was of fair quality, but was  satisfactory for use as a bypass graft.  Simultaneously, a median sternotomy was performed and the left internal mammary artery was harvested using standard technique.  The mammary artery was a good quality vessel with excellent flow and divided  distally. After the conduits had been harvested, the remainder of the full heparin dose was given.  A sternal retractor was placed and opened over time.  The pericardium was opened.  The ascending aorta was inspected.  It was enlarged at about 3.8-3.9 cm, but not definitively aneurysmal.  Adequate anticoagulation was confirmed with ACT measurement.   The aorta was cannulated via concentric 2-0 Ethibond pledgeted pursestring sutures.  A dual stage venous cannula was placed via a pursestring suture in the right atrial appendage.  Cardiopulmonary bypass was initiated.  Flows were maintained per  protocol.  The patient was cooled to 32 degrees Celsius.  The coronary arteries were inspected.  The anastomotic sites were chosen.  All the coronaries were noted to be diffusely diseased and calcified vessels.  The conduits were inspected and cut to  length.  A foam pad was placed in the pericardium to insulate the heart.  A temperature probe was placed in the myocardial septum and a cardioplegia cannula was placed in the ascending aorta.  The aorta was cross clamped.  The left  ventricle was emptied via the aortic root vent.  Cardiac arrest then was achieved with a combination of cold antegrade blood cardioplegia and topical iced saline.  1 liter of cardioplegia was  administered.  There  was rapid diastolic arrest.  There was septal cooling to 11 degrees Celsius.  A reversed saphenous vein graft was placed end to side to the posterior descending branch of the right coronary.  This vessel accepted a 1.5 mm probe distally.  It was heavily calcified proximally.  There was moderate calcification at the anastomosis.  It  was a fair quality target.  The vein was anastomosed end-to-side with a running 7-0 Prolene suture.  All anastomoses were probed proximally and distally at their completion.  Cardioplegia was administered down the vein graft and there was good flow and  good hemostasis.  Additional cardioplegia was also administered via the aortic root.  Next, a reversed saphenous vein graft was placed end-to-side to the first diagonal branch of the LAD.  This was a large bifurcating vessel, was a dominant anterolateral wall branch.  It was a 1.5 mm fair quality target at the site of the anastomosis.  The  vein was anastomosed end-to-side with a running 7-0 Prolene suture.  Again, there was good flow and good hemostasis with the cardioplegia administration.  The heart was elevated exposing the lateral wall.  OM1 was too small to graft.  OM2 was a diffusely calcified vessel, but was suitable for grafting.  OM3 was borderline, but was felt to be potentially graftable.  A side-to-side anastomosis was performed to  OM2 with a running 7-0 Prolene suture.  The vein was of fair quality.  The target was fair quality.  The distal end of the vein then was beveled and anastomosed end-to-side to OM3.  This was a poor quality target that only accepts the 1 mm probe  distally.  With cardioplegia administration there was good flow through the graft and good hemostasis.  The left internal mammary artery was brought through a window in the pericardium.  The distal end was bevelled.  It was anastomosed end-to-side to the mid LAD.  It was grafted relatively distally in the mid LAD.   There was significant calcific plaque at  the site of the anastomosis and beyond, but a 1.5 mm probe did pass to the apex as well as for a good distance proximally.  The mammary was anastomosed end-to-side with a running 8-0 Prolene suture.  At the completion of the mammary to LAD anastomosis,  the bulldog clamp was briefly removed.  There was good hemostasis.  Rapid septal rewarming was noted.  The bulldog clamp was replaced.  The mammary pedicle was tacked to the epicardial surface of the heart with 6-0 Prolene sutures.  Additional cardioplegia was administered.  The cardioplegia cannula then was removed from the ascending aorta.  The proximal vein graft anastomoses were performed to 4.0 mm punch aortotomies with running 6-0 Prolene sutures.  At the completion of the  final proximal anastomosis, the patient was placed in Trendelenburg position.  Lidocaine was administered.  The aortic root was de-aired and the aortic crossclamp was removed.  Total crossclamp time was 150 minutes.  He required a single defibrillation  with 10 joules and then was in a bradycardic rhythm thereafter.  While rewarming was completed all proximal and distal anastomoses were inspected for hemostasis.  Epicardial pacing wires were placed on the right ventricle and right atrium.  When the patient had rewarmed to a core temperature  of 37 degrees Celsius he  was weaned from cardiopulmonary bypass on the first attempt.  Total bypass time was 184 minutes.  The initial cardiac index was 1.8 liters per minute per meter squared.  The patient remained hemodynamically stable throughout the post-bypass period.  The test dose of protamine was administered and was well tolerated.  The atrial and aortic cannula were removed.  The remainder of the protamine was administered without incident.  The chest was irrigated with warm saline.  Hemostasis was achieved.  The  pericardium was reapproximated over the aortic root with interrupted 3-0 silk  sutures.  It was not closed over the heart.  Left pleural and mediastinal chest tubes were placed through separate subcostal incisions.  The sternum was closed with a  combination of single and double heavy gauge stainless steel wires, the pectoralis fascia, subcutaneous tissue and skin were closed in standard fashion.  There was a transient drop in the cardiac index with chest closure that improved with volume  administration.  Transesophageal echo was unchanged after chest closure.  There were no new regional wall motion abnormalities.  All sponge, needle and instrument counts were correct at the end of the procedure, the patient was taken from the operating  room to the surgical intensive care unit, intubated and in good condition.   PUS D: 07/16/2021 7:22:41 pm T: 07/17/2021 1:39:00 am  JOB: [id number]

## 2021-07-18 ENCOUNTER — Inpatient Hospital Stay (HOSPITAL_COMMUNITY): Payer: Medicare PPO

## 2021-07-18 LAB — CBC
HCT: 28 % — ABNORMAL LOW (ref 39.0–52.0)
Hemoglobin: 9.3 g/dL — ABNORMAL LOW (ref 13.0–17.0)
MCH: 31.5 pg (ref 26.0–34.0)
MCHC: 33.2 g/dL (ref 30.0–36.0)
MCV: 94.9 fL (ref 80.0–100.0)
Platelets: 91 10*3/uL — ABNORMAL LOW (ref 150–400)
RBC: 2.95 MIL/uL — ABNORMAL LOW (ref 4.22–5.81)
RDW: 12.9 % (ref 11.5–15.5)
WBC: 7.3 10*3/uL (ref 4.0–10.5)
nRBC: 0 % (ref 0.0–0.2)

## 2021-07-18 LAB — BASIC METABOLIC PANEL
Anion gap: 12 (ref 5–15)
BUN: 14 mg/dL (ref 8–23)
CO2: 23 mmol/L (ref 22–32)
Calcium: 8.6 mg/dL — ABNORMAL LOW (ref 8.9–10.3)
Chloride: 100 mmol/L (ref 98–111)
Creatinine, Ser: 1.32 mg/dL — ABNORMAL HIGH (ref 0.61–1.24)
GFR, Estimated: 56 mL/min — ABNORMAL LOW (ref >60)
Glucose, Bld: 154 mg/dL — ABNORMAL HIGH (ref 70–99)
Potassium: 3.8 mmol/L (ref 3.5–5.1)
Sodium: 135 mmol/L (ref 135–145)

## 2021-07-18 LAB — GLUCOSE, CAPILLARY
Glucose-Capillary: 154 mg/dL — ABNORMAL HIGH (ref 70–99)
Glucose-Capillary: 148 mg/dL — ABNORMAL HIGH (ref 70–99)
Glucose-Capillary: 165 mg/dL — ABNORMAL HIGH (ref 70–99)
Glucose-Capillary: 176 mg/dL — ABNORMAL HIGH (ref 70–99)
Glucose-Capillary: 166 mg/dL — ABNORMAL HIGH (ref 70–99)

## 2021-07-18 MED ORDER — AMIODARONE IV BOLUS ONLY 150 MG/100ML: 150.0000 mg | Freq: Once | INTRAVENOUS | Status: DC

## 2021-07-18 MED ORDER — POTASSIUM CHLORIDE 10 MEQ/50ML IV SOLN
10.0000 meq | INTRAVENOUS | Status: AC
Start: 2021-07-18 — End: 2021-07-18
  Administered 2021-07-18 (×3): 10 meq via INTRAVENOUS
  Filled 2021-07-18 (×3): qty 50

## 2021-07-18 MED ORDER — AMIODARONE LOAD VIA INFUSION
150.0000 mg | Freq: Once | INTRAVENOUS | Status: AC
  Administered 2021-07-18: 150 mg via INTRAVENOUS
  Filled 2021-07-18: qty 83.34

## 2021-07-18 MED ORDER — INSULIN ASPART 100 UNIT/ML IJ SOLN
5.0000 [IU] | Freq: Three times a day (TID) | INTRAMUSCULAR | Status: DC
  Administered 2021-07-19 – 2021-07-22 (×9): 5 [IU] via SUBCUTANEOUS

## 2021-07-18 NOTE — Progress Notes (Signed)
2 Days Post-Op Procedure(s) (LRB): CORONARY ARTERY BYPASS GRAFTING (CABG) X FIVE, USING LEFT INTERNAL MAMMARY ARTERY AND RIGHT LEG GREATER SAPHENOUS VEIN HARVESTED ENDOSCOPICALLY (N/A) TRANSESOPHAGEAL ECHOCARDIOGRAM (TEE) (N/A) APPLICATION OF CELL SAVER ENDOVEIN HARVEST OF GREATER SAPHENOUS VEIN Subjective: Feels "OK" not much appetite, pain controlled  Objective: Vital signs in last 24 hours: Temp:  [98.2 F (36.8 C)-99.6 F (37.6 C)] 98.2 F (36.8 C) (09/14 0742) Pulse Rate:  [63-124] 120 (09/14 0700) Cardiac Rhythm: Normal sinus rhythm;Atrial fibrillation (09/13 1900) Resp:  [0-38] 34 (09/14 0700) BP: (98-122)/(69-90) 98/70 (09/14 0700) SpO2:  [95 %-100 %] 97 % (09/14 0700) Arterial Line BP: (114-153)/(50-57) 125/50 (09/13 1700) Weight:  [125.7 kg] 125.7 kg (09/14 0600)  Hemodynamic parameters for last 24 hours: PAP: (27-34)/(10-14) 33/13 CO:  [6.1 L/min] 6.1 L/min CI:  [2.6 L/min/m2] 2.6 L/min/m2  Intake/Output from previous day: 09/13 0701 - 09/14 0700 In: 1485.4 [P.O.:240; I.V.:744.4; IV Piggyback:501] Out: 1725 [Urine:1565; Chest Tube:160] Intake/Output this shift: No intake/output data recorded.  General appearance: alert, cooperative, and no distress Neurologic: intact Heart: irregularly irregular rhythm Lungs: diminished breath sounds bibasilar Abdomen: normal findings: soft, non-tender  Lab Results: Recent Labs    07/17/21 1651 07/18/21 0350  WBC 7.9 7.3  HGB 9.2* 9.3*  HCT 27.6* 28.0*  PLT 92* 91*   BMET:  Recent Labs    07/17/21 1651 07/18/21 0350  NA 134* 135  K 3.8 3.8  CL 102 100  CO2 21* 23  GLUCOSE 108* 154*  BUN 13 14  CREATININE 1.37* 1.32*  CALCIUM 8.4* 8.6*    PT/INR:  Recent Labs    07/16/21 1411  LABPROT 17.9*  INR 1.5*   ABG    Component Value Date/Time   PHART 7.360 07/16/2021 2103   HCO3 20.7 07/16/2021 2103   TCO2 22 07/16/2021 2103   ACIDBASEDEF 4.0 (H) 07/16/2021 2103   O2SAT 98.0 07/16/2021 2103   CBG (last  3)  Recent Labs    07/17/21 2320 07/18/21 0404 07/18/21 0700  GLUCAP 115* 154* 148*    Assessment/Plan: S/P Procedure(s) (LRB): CORONARY ARTERY BYPASS GRAFTING (CABG) X FIVE, USING LEFT INTERNAL MAMMARY ARTERY AND RIGHT LEG GREATER SAPHENOUS VEIN HARVESTED ENDOSCOPICALLY (N/A) TRANSESOPHAGEAL ECHOCARDIOGRAM (TEE) (N/A) APPLICATION OF CELL SAVER ENDOVEIN HARVEST OF GREATER SAPHENOUS VEIN POD # 2 CV- in A fib/ flutter this Am  Attempted to rapid AP out of flutter- unsuccessful  Rebolus amiodarone, continue drip, PRN metoprolol RESP- IS for atelectasis RENAL- creatinine up slightly, monitor  Supplement K ENDO- CBG elevated yesterday afternoon- add meal coverage, continue levemir  Restart metformin when creatinine improves GI- diet as tolerated Cardiac rehab Thrombocytopenia stable   LOS: 6 days    Melrose Nakayama 07/18/2021

## 2021-07-18 NOTE — Plan of Care (Signed)
  Problem: Education: Goal: Knowledge of General Education information will improve Description: Including pain rating scale, medication(s)/side effects and non-pharmacologic comfort measures Outcome: Progressing   Problem: Education: Goal: Understanding of CV disease, CV risk reduction, and recovery process will improve Outcome: Progressing   Problem: Activity: Goal: Ability to return to baseline activity level will improve Outcome: Progressing   Problem: Cardiovascular: Goal: Ability to achieve and maintain adequate cardiovascular perfusion will improve Outcome: Progressing   Problem: Education: Goal: Knowledge of the prescribed therapeutic regimen will improve Outcome: Progressing

## 2021-07-18 NOTE — Discharge Instructions (Addendum)
Discharge Instructions:  1. You may shower, please wash incisions daily with soap and water and keep dry.  If you wish to cover wounds with dressing you may do so but please keep clean and change daily.  No tub baths or swimming until incisions have completely healed.  If your incisions become red or develop any drainage please call our office at [phone]  2. No Driving until cleared by Dr. Leonarda Salon office and you are no longer using narcotic pain medications  3. Monitor your weight daily.. Please use the same scale and weigh at same time... If you gain 5-10 lbs in 48 hours with associated lower extremity swelling, please contact our office at [phone]  4. Fever of 101.5 for at least 24 hours with no source, please contact our office at [phone]  5. Activity- up as tolerated, please walk at least 3 times per day.  Avoid strenuous activity, no lifting, pushing, or pulling with your arms over 8-10 lbs for a minimum of 6 weeks  6. If any questions or concerns arise, please do not hesitate to contact our office at [phone]  Information on my medicine - ELIQUIS (apixaban)  This medication education was reviewed with me or my healthcare representative as part of my discharge preparation.  The pharmacist that spoke with me during my hospital stay was:  Einar Grad, [facility]  Why was Eliquis prescribed for you? Eliquis was prescribed for you to reduce the risk of a blood clot forming that can cause a stroke if you have a medical condition called atrial fibrillation (a type of irregular heartbeat).  What do You need to know about Eliquis ? Take your Eliquis TWICE DAILY - one tablet in the morning and one tablet in the evening with or without food. If you have difficulty swallowing the tablet whole please discuss with your pharmacist how to take the medication safely.  Take Eliquis exactly as prescribed by your doctor and DO NOT stop taking Eliquis without talking to the  doctor who prescribed the medication.  Stopping may increase your risk of developing a stroke.  Refill your prescription before you run out.  After discharge, you should have regular check-up appointments with your healthcare provider that is prescribing your Eliquis.  In the future your dose may need to be changed if your kidney function or weight changes by a significant amount or as you get older.  What do you do if you miss a dose? If you miss a dose, take it as soon as you remember on the same day and resume taking twice daily.  Do not take more than one dose of ELIQUIS at the same time to make up a missed dose.  Important Safety Information A possible side effect of Eliquis is bleeding. You should call your healthcare provider right away if you experience any of the following: Bleeding from an injury or your nose that does not stop. Unusual colored urine (red or dark brown) or unusual colored stools (red or black). Unusual bruising for unknown reasons. A serious fall or if you hit your head (even if there is no bleeding).  Some medicines may interact with Eliquis and might increase your risk of bleeding or clotting while on Eliquis. To help avoid this, consult your healthcare provider or pharmacist prior to using any new prescription or non-prescription medications, including herbals, vitamins, non-steroidal anti-inflammatory drugs (NSAIDs) and supplements.  This website has more information on Eliquis (apixaban): http://www.eliquis.com/eliquis/home

## 2021-07-18 NOTE — Progress Notes (Addendum)
      [address]       [place] [address]             [phone]      2 Days Post-Op  Procedure(s) (LRB): CORONARY ARTERY BYPASS GRAFTING (CABG) X FIVE, USING LEFT INTERNAL MAMMARY ARTERY AND RIGHT LEG GREATER SAPHENOUS VEIN HARVESTED ENDOSCOPICALLY (N/A) TRANSESOPHAGEAL ECHOCARDIOGRAM (TEE) (N/A) APPLICATION OF CELL SAVER ENDOVEIN HARVEST OF GREATER SAPHENOUS VEIN   Total Length of Stay:  LOS: 6 days    SUBJECTIVE:  Vitals:   07/18/21 1300 07/18/21 1400  BP: 113/76 99/79  Pulse: (!) 112 (!) 115  Resp: (!) 24 (!) 34  Temp:    SpO2: 99% 97%    Intake/Output      09/13 0701 09/14 0700 09/14 0701 09/15 0700   P.O. 240    I.V. (mL/kg) 744.4 (5.9) 236.8 (1.9)   Blood     IV Piggyback 501 150   Total Intake(mL/kg) 1485.4 (11.8) 386.9 (3.1)   Urine (mL/kg/hr) 1565 (0.5) 300 (0.3)   Chest Tube 160    Total Output 1725 300   Net -239.6 +86.9            sodium chloride Stopped (07/17/21 0833)   amiodarone 30 mg/hr (07/18/21 1503)    ceFAZolin (ANCEF) IV 2 g (07/18/21 1444)   lactated ringers Stopped (07/16/21 1544)   lactated ringers 10 mL/hr at 07/18/21 1400    CBC    Component Value Date/Time   WBC 7.3 07/18/2021 0350   RBC 2.95 (L) 07/18/2021 0350   HGB 9.3 (L) 07/18/2021 0350   HCT 28.0 (L) 07/18/2021 0350   PLT 91 (L) 07/18/2021 0350   MCV 94.9 07/18/2021 0350   MCH 31.5 07/18/2021 0350   MCHC 33.2 07/18/2021 0350   RDW 12.9 07/18/2021 0350   CMP     Component Value Date/Time   NA 135 07/18/2021 0350   NA 143 06/22/2021 0847   K 3.8 07/18/2021 0350   CL 100 07/18/2021 0350   CO2 23 07/18/2021 0350   GLUCOSE 154 (H) 07/18/2021 0350   BUN 14 07/18/2021 0350   BUN 12 06/22/2021 0847   CREATININE 1.32 (H) 07/18/2021 0350   CALCIUM 8.6 (L) 07/18/2021 0350   PROT 6.3 (L) 07/15/2021 0145   ALBUMIN 3.5 07/15/2021 0145   AST 23 07/15/2021 0145   ALT 28 07/15/2021 0145   ALKPHOS 67 07/15/2021 0145   BILITOT 0.5 07/15/2021 0145    GFRNONAA 56 (L) 07/18/2021 0350   GFRAA >60 08/15/2015 1833   ABG    Component Value Date/Time   PHART 7.360 07/16/2021 2103   PCO2ART 37.0 07/16/2021 2103   PO2ART 117 (H) 07/16/2021 2103   HCO3 20.7 07/16/2021 2103   TCO2 22 07/16/2021 2103   ACIDBASEDEF 4.0 (H) 07/16/2021 2103   O2SAT 98.0 07/16/2021 2103   CBG (last 3)  Recent Labs    07/18/21 0404 07/18/21 0700 07/18/21 1131  GLUCAP 154* 148* 165*     ASSESSMENT:   -Patient converted to NSR rates currently in the 60s-continue Amiodarone drip overnight, can possibly convert to oral in AM  -Diuresing well  Stable day    Ellwood Handler, PA-C 07/18/21 3:40 PM    Chart reviewed, agree with above. Rhythm is sinus 80's on IV amio.

## 2021-07-18 NOTE — Discharge Summary (Signed)
[address]       [place] [address]             [phone]    Physician Discharge Summary  Patient ID: Oscar [name] MRN: [id number] DOB/AGE: 04-27-1946 82 y.o.  Admit date: 07/12/2021 Discharge date: 07/25/2021  Admission Diagnoses:  Patient Active Problem List   Diagnosis Date Noted   Unstable angina ([place]) 07/12/2021   Coronary artery disease involving native coronary artery of native heart with unstable angina pectoris ([place])    Essential hypertension 01/02/2021   Dyslipidemia, goal LDL below 70 01/02/2021   Obesity (BMI 30-39.9) 01/02/2021   Palpitations 01/02/2021   Cold extremities 03/02/2018   Pain of lower extremity 03/02/2018   Decreased pedal pulses 03/02/2018   Dyspnea 06/28/2016   Abnormal EKG 06/28/2016   Premature atrial contractions 06/28/2016   Family history of heart disease 06/28/2016   CAD in native artery 06/28/2016   Discharge Diagnoses:  Patient Active Problem List   Diagnosis Date Noted   S/P CABG x 5    Unstable angina ([place]) 07/12/2021   Coronary artery disease involving native coronary artery of native heart with unstable angina pectoris ([place])    Essential hypertension 01/02/2021   Dyslipidemia, goal LDL below 70 01/02/2021   Obesity (BMI 30-39.9) 01/02/2021   Palpitations 01/02/2021   Cold extremities 03/02/2018   Pain of lower extremity 03/02/2018   Decreased pedal pulses 03/02/2018   Dyspnea 06/28/2016   Abnormal EKG 06/28/2016   Premature atrial contractions 06/28/2016   Family history of heart disease 06/28/2016   CAD in native artery 06/28/2016   Discharged Condition: good  History of Present Illness:   Oscar [name]  is a 75 yo male with multiple comorbidities and cardiac risk factors including moderate obesity, dyslipidemia, hypertension and type 2 diabetes mellitus.  He is also noted to have a strong family history of heart disease with his father passing from a myocardial infarction at age 1 and a brother  deceased from myocardial infarction at age 31.  He is a remote smoker with  use for approximately 10 years.  He quit in 1975.  The patient describes a history of recent increase in palpitations and saw his cardiologist who did an EKG which revealed he was having PACs.  He was started on metoprolol 25 mg twice daily with notable improvement in his symptoms.  He noted improvement in both heart rate as well as blood pressure.  The patient initially denied  chest pain but does note he gets short of breath and diaphoretic very easily with minimal exertion.  This has shown progressive worsening recently.  He additionally now has some chest pressure symptoms.  It has been particularly true for the past approximate 2 weeks.  Additionally he notes recent increased fatigability. He did have Covid in July .  He has  history of cardiology evaluation in the early 2000's including catheterization in 2002 which at that time showed normal LV systolic function and three-vessel nonobstructive coronary disease.  He was treated medically and followed over the years.  He again was seen recently for these symptoms and EKG revealed sinus rhythm with PACs and left anterior fascicular block.  There was voltage criteria for LVH and lateral T wave inversions concerning for ischemia.  A CT angiogram was performed on 06/28/2021 and the quality was felt to be suboptimal however his calcium score was significantly elevated at 2381 which was 45 percentile for age and sex matched controls.  Coronary arteries were felt to be diffusely diseased with severe stenosis in the right coronary artery.  LAD and circumflex arteries also showed significant disease.  Cardiac catheterization was recommended which he underwent on today's date.  He is found to have severe three-vessel coronary artery disease.  Cardiothoracic consultation was requested.  Hospital Course:  The patient was evaluated by Dr. Roxan Hockey who felt coronary bypass grafting would be  indicated.  The patient was agreeable to proceed.  He remained chest pain free prior to surgery.  He was taken to the operating room on 07/16/2021 and underwent CABG x 5 utilizing LIMA to LAD, SVG to DIAG, SVG to PDA, SEQ SVG OM2 and OM3.  He also underwent endoscopic harvest of greater saphenous vein from her right leg.  On postop day 1 he was noted to be doing well.  He is in sinus rhythm and his dobutamine was weaned as hemodynamics allowed.  He underwent debridement of chest tubes and arterial lines and catheters. He has been started on aspirin, beta-blocker and statin.  Chest x-ray does show bibasilar atelectasis and he is doing routine pulmonary toilet.  He does show some postoperative volume overload and is begun on a course of diuresis.  Blood sugars have been controlled using insulin drip and will be transitioned using standard measures with ultimate plan to resume metformin once creatinine normalizes. He does have an expected acute blood loss anemia which is stable and is being monitored clinically.  He developed Rapid Atrial Fibrillation/Flutter.  He was treated with Amiodarone bolus and drip therapy.  Rapid atrial pacing was attempted for Atrial flutter and was ultimately unsuccessful.  He required additional Amiodarone boluses.  He does have an expected acute blood loss anemia which is stable and is being monitored clinically.  Rapid atrial fibrillation persisted on post-op day 3 and was treated with and additional amiodarone bolus and digoxin loading. Potassium was supplemented as required and the metoprolol was also up-titrated.  He converted to NSR with PACs.  He was transitioned to oral Amiodarone.  He developed a mild elevation in his creatinine likely due to volume overloaded state, diuretics were continued.  He was medically stable for transfer to the progressive care unit on 07/20/2021 but remained in the ICU due to bed shortage is in the stepdown unit.  He continued to have paroxysmal atrial  fibrillation.  He was anticoagulated with Eliquis.He did require some additional titration of beta blocker for increased HR at times. He also required some further diuresis for volume overload. Most recent creatinine is 1.44.  Oxygen has been weaned and he maintains good saturations on room air.  Incisions are healing well without evidence of infection.  He is tolerating routine cardiac rehab modalities.  Overall, at the time of discharge the patient is felt to be quite stable.  He will be resumed back on his Lasix at his preoperative dosing which was as needed for lower extremity edema.  Consults: None  Significant Diagnostic Studies: angiography:     Prox RCA lesion is 50% stenosed.   Dist RCA lesion is 80% stenosed.   Prox Cx lesion is 50% stenosed.   1st Mrg lesion is 70% stenosed.   2nd Mrg lesion is 80% stenosed.   Mid Cx to Dist Cx lesion is 90% stenosed.   Prox LAD to Mid LAD lesion is 75% stenosed.   Mid LAD lesion is 70% stenosed.   Dist LAD lesion is 99% stenosed.   1st Diag lesion is 50% stenosed.  The left ventricular systolic function is normal.   LV end diastolic pressure is mildly elevated.   The left ventricular ejection fraction is 55-65% by visual estimate.   There is no mitral valve regurgitation.   Severe three vessel CAD The LAD is a large caliber vessel that courses to the apex giving off one large diagonal branch. The mid LAD has a severe stenosis just after the takeoff of the Diagonal branch. There is diffuse disease in the mid LAD beyond this stenosis. Severe distal LAD stenosis. The Diagonal branch has moderate non-obstructive disease.  The Circumflex gives off several moderate caliber obtuse marginal branches. The first OM branch has moderately severe proximal stenosis. The second obtuse marginal branch has moderately severe proximal stenosis. The AV groove Circumflex has severe stenosis in the mid segment beyond the takeoff of OM2.  The RCA is a large, calcified  vessel with an aneurysmal mid segment and heavily calcified, severe distal stenosis.  Normal LV systolic function    Recommendations: Will consult CT surgery for CABG in this diabetic patient with severe, multi-vessel CAD. Continue ASA, statin and beta blocker. Echo today. Admit to telemetry.   Treatments: surgery:   Operative Report    DATE OF PROCEDURE: 07/16/2021   PREOPERATIVE DIAGNOSIS:  Three-vessel coronary artery disease.   POSTOPERATIVE DIAGNOSIS:  Three-vessel coronary artery disease.   PROCEDURES PERFORMED:  Median sternotomy, extracorporeal circulation, coronary artery bypass grafting x5 (left internal mammary artery to LAD, saphenous vein graft to first diagonal, saphenous vein graft to posterior descending, sequential saphenous vein  graft to obtuse marginal 2 and 3).  Endoscopic vein harvest, right leg.   SURGEON:  Revonda Standard. Roxan Hockey, MD   ASSISTANT:  Ellwood Handler, PA-C   Discharge Exam: Blood pressure 140/81, pulse 74, temperature 99.4 F (37.4 C), temperature source Oral, resp. rate 18, height 6' (1.829 m), weight 120.4 kg, SpO2 99 %.  General appearance: alert, cooperative, and no distress Heart: regular rate and rhythm Lungs: clear to auscultation bilaterally Abdomen: benign Extremities: + LE edema Wound: incis healing well  Discharge Medications:  The patient has been discharged on:   1.Beta Blocker:  Yes [ X  ]                              No   [   ]                              If No, reason:  2.Ace Inhibitor/ARB: Yes [   ]                                     No  [  n  ]                                     If No, reason:renal insuff  3.Statin:   Yes [ X  ]                  No  [   ]                  If No, reason:  4.Ecasa:  Yes  [ X  ]  No   [   ]                  If No, reason:  Patient had ACS upon admission:  Plavix/P2Y12 inhibitor: Yes [   ]                                      No  [ n  ]     Discharge  Instructions     Discharge patient   Complete by: As directed    Discharge disposition: 01-Home or Self Care   Discharge patient date: 07/25/2021      Allergies as of 07/25/2021       Reactions   Yellow Fever Vaccine Hives        Medication List     STOP taking these medications    amLODipine 5 MG tablet Commonly known as: NORVASC   colchicine 0.6 MG tablet   losartan 50 MG tablet Commonly known as: COZAAR   metoprolol succinate 25 MG 24 hr tablet Commonly known as: TOPROL-XL   nabumetone 500 MG tablet Commonly known as: RELAFEN   nitroGLYCERIN 0.4 MG SL tablet Commonly known as: Nitrostat   pravastatin 40 MG tablet Commonly known as: PRAVACHOL   sildenafil 100 MG tablet Commonly known as: VIAGRA   traMADol 50 MG tablet Commonly known as: ULTRAM       TAKE these medications    amiodarone 400 MG tablet Commonly known as: PACERONE Take 1 tablet (400 mg total) by mouth 2 (two) times daily. For 7 days then once daily   apixaban 5 MG Tabs tablet Commonly known as: ELIQUIS Take 1 tablet (5 mg total) by mouth 2 (two) times daily.   aspirin 81 MG tablet Take 81 mg by mouth daily.   atorvastatin 80 MG tablet Commonly known as: LIPITOR Take 1 tablet (80 mg total) by mouth daily. Start taking on: July 26, 2021   digoxin 0.125 MG tablet Commonly known as: LANOXIN Take 1 tablet (0.125 mg total) by mouth daily. Start taking on: July 26, 2021   furosemide 20 MG tablet Commonly known as: LASIX Take 1 tablet (20 mg total) by mouth daily as needed for edema.   halobetasol 0.05 % cream Commonly known as: ULTRAVATE Apply 1 application topically 2 (two) times daily as needed (psoriasis).   hydrocortisone 2.5 % cream Apply topically 2 (two) times daily. What changed:  how much to take when to take this reasons to take this   Magnesium 250 MG Tabs Take 500 mg by mouth daily.   metFORMIN 500 MG tablet Commonly known as: GLUCOPHAGE Take  500-1,000 mg by mouth See admin instructions. 500 mg in the morning, 1000 mg in the evening   metoprolol tartrate 50 MG tablet Commonly known as: LOPRESSOR Take 1 tablet (50 mg total) by mouth 2 (two) times daily.   omeprazole 20 MG tablet Commonly known as: PRILOSEC OTC Take 20 mg by mouth daily.   oxyCODONE 5 MG immediate release tablet Commonly known as: Oxy IR/ROXICODONE Take 1 tablet (5 mg total) by mouth every 6 (six) hours as needed for severe pain.   vitamin B-12 500 MCG tablet Commonly known as: CYANOCOBALAMIN Take 500 mcg by mouth daily.               Durable Medical Equipment  (From admission, onward)  Start     Ordered   07/23/21 0918  For home use only DME Walker rolling  Once       Question Answer Comment  Walker: With Kinta Wheels   Patient needs a walker to treat with the following condition Physical deconditioning      07/23/21 0917            Follow-up Information     [facility]  Follow up on 08/14/2021.   Specialty: Cardiothoracic Surgery Why: Appointment is at 1:00, please get CXR at 12:30 at [place] located on first floor of our office building Contact information: [place], [place] [phone]        Pixie Casino, MD. Go on 08/17/2021.   Specialty: Cardiology Why: Your appointment is at 9:45 AM Contact information: [place] [address] [phone]                 Signed: John Giovanni, PA-C  07/25/2021, 9:21 AM

## 2021-07-18 NOTE — Plan of Care (Signed)
  Problem: Education: Goal: Knowledge of General Education information will improve Description: Including pain rating scale, medication(s)/side effects and non-pharmacologic comfort measures Outcome: Progressing   Problem: Health Behavior/Discharge Planning: Goal: Ability to manage health-related needs will improve Outcome: Progressing   Problem: Clinical Measurements: Goal: Cardiovascular complication will be avoided Outcome: Progressing   Problem: Education: Goal: Understanding of CV disease, CV risk reduction, and recovery process will improve Outcome: Progressing Goal: Individualized Educational Video(s) Outcome: Progressing   Problem: Activity: Goal: Ability to return to baseline activity level will improve Outcome: Progressing   Problem: Cardiovascular: Goal: Ability to achieve and maintain adequate cardiovascular perfusion will improve Outcome: Progressing Goal: Vascular access site(s) Level 0-1 will be maintained Outcome: Progressing   Problem: Health Behavior/Discharge Planning: Goal: Ability to safely manage health-related needs after discharge will improve Outcome: Progressing   Problem: Education: Goal: Will demonstrate proper wound care and an understanding of methods to prevent future damage Outcome: Progressing Goal: Knowledge of disease or condition will improve Outcome: Progressing Goal: Knowledge of the prescribed therapeutic regimen will improve Outcome: Progressing Goal: Individualized Educational Video(s) Outcome: Progressing   Problem: Activity: Goal: Risk for activity intolerance will decrease Outcome: Progressing   Problem: Cardiac: Goal: Will achieve and/or maintain hemodynamic stability Outcome: Progressing   Problem: Clinical Measurements: Goal: Postoperative complications will be avoided or minimized Outcome: Progressing   Problem: Respiratory: Goal: Respiratory status will improve Outcome: Progressing   Problem: Skin  Integrity: Goal: Wound healing without signs and symptoms of infection Outcome: Progressing Goal: Risk for impaired skin integrity will decrease Outcome: Progressing   Problem: Urinary Elimination: Goal: Ability to achieve and maintain adequate renal perfusion and functioning will improve Outcome: Progressing

## 2021-07-19 ENCOUNTER — Inpatient Hospital Stay (HOSPITAL_COMMUNITY): Payer: Medicare PPO

## 2021-07-19 LAB — GLUCOSE, CAPILLARY
Glucose-Capillary: 104 mg/dL — ABNORMAL HIGH (ref 70–99)
Glucose-Capillary: 131 mg/dL — ABNORMAL HIGH (ref 70–99)
Glucose-Capillary: 144 mg/dL — ABNORMAL HIGH (ref 70–99)
Glucose-Capillary: 158 mg/dL — ABNORMAL HIGH (ref 70–99)
Glucose-Capillary: 161 mg/dL — ABNORMAL HIGH (ref 70–99)
Glucose-Capillary: 95 mg/dL (ref 70–99)

## 2021-07-19 LAB — BASIC METABOLIC PANEL
Anion gap: 9 (ref 5–15)
BUN: 15 mg/dL (ref 8–23)
CO2: 24 mmol/L (ref 22–32)
Calcium: 8.4 mg/dL — ABNORMAL LOW (ref 8.9–10.3)
Chloride: 102 mmol/L (ref 98–111)
Creatinine, Ser: 1.19 mg/dL (ref 0.61–1.24)
GFR, Estimated: >60 mL/min (ref >60)
Glucose, Bld: 122 mg/dL — ABNORMAL HIGH (ref 70–99)
Potassium: 3.8 mmol/L (ref 3.5–5.1)
Sodium: 135 mmol/L (ref 135–145)

## 2021-07-19 LAB — CBC
HCT: 26.2 % — ABNORMAL LOW (ref 39.0–52.0)
Hemoglobin: 8.6 g/dL — ABNORMAL LOW (ref 13.0–17.0)
MCH: 30.9 pg (ref 26.0–34.0)
MCHC: 32.8 g/dL (ref 30.0–36.0)
MCV: 94.2 fL (ref 80.0–100.0)
Platelets: 100 10*3/uL — ABNORMAL LOW (ref 150–400)
RBC: 2.78 MIL/uL — ABNORMAL LOW (ref 4.22–5.81)
RDW: 12.9 % (ref 11.5–15.5)
WBC: 6.5 10*3/uL (ref 4.0–10.5)
nRBC: 0 % (ref 0.0–0.2)

## 2021-07-19 MED ORDER — FUROSEMIDE 40 MG PO TABS
40.0000 mg | ORAL_TABLET | Freq: Every day | ORAL | Status: DC
Start: 2021-07-19 — End: 2021-07-21
  Administered 2021-07-19 – 2021-07-21 (×3): 40 mg via ORAL
  Filled 2021-07-19 (×3): qty 1

## 2021-07-19 MED ORDER — DIGOXIN 125 MCG PO TABS
0.1250 mg | ORAL_TABLET | Freq: Every day | ORAL | Status: DC
  Administered 2021-07-20 – 2021-07-25 (×6): 0.125 mg via ORAL
  Filled 2021-07-19 (×6): qty 1

## 2021-07-19 MED ORDER — AMIODARONE LOAD VIA INFUSION
150.0000 mg | Freq: Once | INTRAVENOUS | Status: AC
  Administered 2021-07-19: 150 mg via INTRAVENOUS
  Filled 2021-07-19: qty 83.34

## 2021-07-19 MED ORDER — AMIODARONE IV BOLUS ONLY 150 MG/100ML: 150.0000 mg | Freq: Once | INTRAVENOUS | Status: DC

## 2021-07-19 MED ORDER — POTASSIUM CHLORIDE ER 10 MEQ PO TBCR: 40.0000 meq | EXTENDED_RELEASE_TABLET | Freq: Once | ORAL | Status: DC

## 2021-07-19 MED ORDER — DIGOXIN 0.25 MG/ML IJ SOLN
0.2500 mg | Freq: Every day | INTRAMUSCULAR | Status: AC
  Administered 2021-07-19: 0.25 mg via INTRAVENOUS
  Filled 2021-07-19: qty 2

## 2021-07-19 MED ORDER — POTASSIUM CHLORIDE CRYS ER 10 MEQ PO TBCR
40.0000 meq | EXTENDED_RELEASE_TABLET | Freq: Every day | ORAL | Status: DC
  Administered 2021-07-19: 40 meq via ORAL
  Filled 2021-07-19 (×3): qty 4

## 2021-07-19 MED ORDER — POTASSIUM CHLORIDE 10 MEQ/100ML IV SOLN: 10.0000 meq | INTRAVENOUS | Status: DC

## 2021-07-19 MED FILL — Mannitol IV Soln 20%: INTRAVENOUS | Qty: 500 | Status: AC

## 2021-07-19 MED FILL — Sodium Bicarbonate IV Soln 8.4%: INTRAVENOUS | Qty: 50 | Status: AC

## 2021-07-19 MED FILL — Albumin, Human Inj 5%: INTRAVENOUS | Qty: 250 | Status: AC

## 2021-07-19 MED FILL — Electrolyte-R (PH 7.4) Solution: INTRAVENOUS | Qty: 6000 | Status: AC

## 2021-07-19 MED FILL — Heparin Sodium (Porcine) Inj 1000 Unit/ML: INTRAMUSCULAR | Qty: 30 | Status: AC

## 2021-07-19 MED FILL — Potassium Chloride Inj 2 mEq/ML: INTRAVENOUS | Qty: 40 | Status: AC

## 2021-07-19 MED FILL — Sodium Chloride IV Soln 0.9%: INTRAVENOUS | Qty: 2000 | Status: AC

## 2021-07-19 MED FILL — Lidocaine HCl Local Preservative Free (PF) Inj 1%: INTRAMUSCULAR | Qty: 5 | Status: AC

## 2021-07-19 MED FILL — Magnesium Sulfate Inj 50%: INTRAMUSCULAR | Qty: 10 | Status: AC

## 2021-07-19 NOTE — Progress Notes (Signed)
EVENING ROUNDS NOTE :     [address]       [place] [address]             [phone]                 3 Days Post-Op Procedure(s) (LRB): CORONARY ARTERY BYPASS GRAFTING (CABG) X FIVE, USING LEFT INTERNAL MAMMARY ARTERY AND RIGHT LEG GREATER SAPHENOUS VEIN HARVESTED ENDOSCOPICALLY (N/A) TRANSESOPHAGEAL ECHOCARDIOGRAM (TEE) (N/A) APPLICATION OF CELL SAVER ENDOVEIN HARVEST OF GREATER SAPHENOUS VEIN   Total Length of Stay:  LOS: 7 days  Events:   Remains in afib Received another bolus Diuresing, down 5lbs today Continue amio gtt for now    BP 102/76   Pulse (!) 126   Temp 99.1 F (37.3 C) (Oral)   Resp (!) 28   Ht 6' (1.829 m)   Wt 123.8 kg   SpO2 95%   BMI 37.02 kg/m          sodium chloride Stopped (07/17/21 0833)   amiodarone 30 mg/hr (07/19/21 1500)   lactated ringers Stopped (07/16/21 1544)   lactated ringers Stopped (07/19/21 0910)    I/O last 3 completed shifts: In: 1794.7 [P.O.:240; I.V.:1100.5; IV Piggyback:454.2] Out: 1500 [Urine:1500]   CBC Latest Ref Rng & Units 07/19/2021 07/18/2021 07/17/2021  WBC 4.0 - 10.5 K/uL 6.5 7.3 7.9  Hemoglobin 13.0 - 17.0 g/dL 8.6(L) 9.3(L) 9.2(L)  Hematocrit 39.0 - 52.0 % 26.2(L) 28.0(L) 27.6(L)  Platelets 150 - 400 K/uL 100(L) 91(L) 92(L)    BMP Latest Ref Rng & Units 07/19/2021 07/18/2021 07/17/2021  Glucose 70 - 99 mg/dL 122(H) 154(H) 108(H)  BUN 8 - 23 mg/dL '15 14 13  '$ Creatinine 0.61 - 1.24 mg/dL 1.19 1.32(H) 1.37(H)  BUN/Creat Ratio 10 - 24 - - -  Sodium 135 - 145 mmol/L 135 135 134(L)  Potassium 3.5 - 5.1 mmol/L 3.8 3.8 3.8  Chloride 98 - 111 mmol/L 102 100 102  CO2 22 - 32 mmol/L 24 23 21(L)  Calcium 8.9 - 10.3 mg/dL 8.4(L) 8.6(L) 8.4(L)    ABG    Component Value Date/Time   PHART 7.360 07/16/2021 2103   PCO2ART 37.0 07/16/2021 2103   PO2ART 117 (H) 07/16/2021 2103   HCO3 20.7 07/16/2021 2103   TCO2 22 07/16/2021 2103   ACIDBASEDEF 4.0 (H) 07/16/2021 2103   O2SAT 98.0 07/16/2021  2103       Melodie Bouillon, MD 07/19/2021 4:22 PM

## 2021-07-19 NOTE — Progress Notes (Addendum)
TCTS DAILY ICU PROGRESS NOTE                   [address]            [place], [address]          [phone]   3 Days Post-Op Procedure(s) (LRB): CORONARY ARTERY BYPASS GRAFTING (CABG) X FIVE, USING LEFT INTERNAL MAMMARY ARTERY AND RIGHT LEG GREATER SAPHENOUS VEIN HARVESTED ENDOSCOPICALLY (N/A) TRANSESOPHAGEAL ECHOCARDIOGRAM (TEE) (N/A) APPLICATION OF CELL SAVER ENDOVEIN HARVEST OF GREATER SAPHENOUS VEIN  Total Length of Stay:  LOS: 7 days   Subjective: Up in the bedside chair, says he walked in the hall last evening.  Pain controlled, bowel movement earlier this morning.  Tolerating p.o.'s but appetite is still poor.    Objective: Vital signs in last 24 hours: Temp:  [98.9 F (37.2 C)-99.7 F (37.6 C)] 99.7 F (37.6 C) (09/15 0700) Pulse Rate:  [42-142] 116 (09/15 0700) Cardiac Rhythm: Atrial fibrillation (09/14 2000) Resp:  [10-40] 19 (09/15 0700) BP: (90-131)/(65-83) 103/77 (09/15 0600) SpO2:  [91 %-100 %] 96 % (09/15 0700) Weight:  [123.8 kg] 123.8 kg (09/15 0500)  Filed Weights   07/17/21 0600 07/18/21 0600 07/19/21 0500  Weight: 125.6 kg 125.7 kg 123.8 kg    Weight change: -1.9 kg     Intake/Output from previous day: 09/14 0701 - 09/15 0700 In: 1274.9 [P.O.:240; I.V.:781.8; IV Piggyback:253.1] Out: 1010 [Urine:1010]  Intake/Output this shift: No intake/output data recorded.  Current Meds: Scheduled Meds:  acetaminophen  1,000 mg Oral Q6H   Or   acetaminophen (TYLENOL) oral liquid 160 mg/5 mL  1,000 mg Per Tube Q6H   aspirin EC  325 mg Oral Daily   Or   aspirin  324 mg Per Tube Daily   atorvastatin  80 mg Oral Daily   bisacodyl  10 mg Oral Daily   Or   bisacodyl  10 mg Rectal Daily   Chlorhexidine Gluconate Cloth  6 each Topical Daily   colchicine  0.6 mg Oral Q breakfast   docusate sodium  200 mg Oral Daily   insulin aspart  0-24 Units Subcutaneous Q4H   insulin aspart  5 Units Subcutaneous TID WC   insulin detemir  25 Units  Subcutaneous BID   mouth rinse  15 mL Mouth Rinse BID   metoprolol tartrate  12.5 mg Oral BID   Or   metoprolol tartrate  12.5 mg Per Tube BID   pantoprazole  40 mg Oral Daily   sodium chloride flush  3 mL Intravenous Q12H   Thrombi-Pad  1 each Topical Once   Continuous Infusions:  sodium chloride Stopped (07/17/21 0833)   amiodarone 30 mg/hr (07/19/21 0600)   lactated ringers Stopped (07/16/21 1544)   lactated ringers 10 mL/hr at 07/19/21 0600   PRN Meds:.dextrose, metoprolol tartrate, morphine injection, ondansetron (ZOFRAN) IV, oxyCODONE, sodium chloride flush, traMADol  General appearance: alert, cooperative, and no distress Neurologic: intact Heart: irregularly irregular rhythm, monitor showing atrial fibrillation with ventricular rate 120-150's this AM. . Lungs: Breath sounds are clear, diminished. Abdomen: Soft, nontender, active bowel sounds. Extremities: Peripheral edema.  Right EVH sites are covered with dry dressings. Wound: Sternotomy incision is covered with a dry dressing.  Lab Results: CBC: Recent Labs    07/18/21 0350 07/19/21 0425  WBC 7.3 6.5  HGB 9.3* 8.6*  HCT 28.0* 26.2*  PLT 91* 100*   BMET:  Recent Labs    07/18/21 0350 07/19/21 0425  NA  135 135  K 3.8 3.8  CL 100 102  CO2 23 24  GLUCOSE 154* 122*  BUN 14 15  CREATININE 1.32* 1.19  CALCIUM 8.6* 8.4*    CMET: Lab Results  Component Value Date   WBC 6.5 07/19/2021   HGB 8.6 (L) 07/19/2021   HCT 26.2 (L) 07/19/2021   PLT 100 (L) 07/19/2021   GLUCOSE 122 (H) 07/19/2021   ALT 28 07/15/2021   AST 23 07/15/2021   NA 135 07/19/2021   K 3.8 07/19/2021   CL 102 07/19/2021   CREATININE 1.19 07/19/2021   BUN 15 07/19/2021   CO2 24 07/19/2021   INR 1.5 (H) 07/16/2021   HGBA1C 5.3 07/15/2021      PT/INR:  Recent Labs    07/16/21 1411  LABPROT 17.9*  INR 1.5*   Radiology: No results found.   Assessment/Plan: S/P Procedure(s) (LRB): CORONARY ARTERY BYPASS GRAFTING (CABG) X  FIVE, USING LEFT INTERNAL MAMMARY ARTERY AND RIGHT LEG GREATER SAPHENOUS VEIN HARVESTED ENDOSCOPICALLY (N/A) TRANSESOPHAGEAL ECHOCARDIOGRAM (TEE) (N/A) APPLICATION OF CELL SAVER ENDOVEIN HARVEST OF GREATER SAPHENOUS VEIN  -CV-postop day 3 CABG x5 after presenting with accelerating angina.  Preop EF 50%.  Stable hemodynamics. Remains in A. fib with RVR 24 hours after starting IV amiodarone load. RAP attempted this morning.  K+ 3.8, will replace.  Last Mg++ 2.4 on 9/13. Considering increasing the metoprolol and re-bolus the amiodarone.   -Pulm-mild left basilar atelectasis, he is maintaining good oxygen saturation on room air.  Encouraging pulmonary hygiene, advance activity when HR controlled.   -Heme-expected acute blood loss anemia, no indication for transfusion.  Platelet count recovering.  Monitor.  -Renal-creatinine trending toward baseline  -Endo- No h/o DM, A1C 5.3.  Glucose control adequate on current regimen.  Continue Levemir, NovoLog, and SSI     Antony Odea, PA-C [phone] 07/19/2021 8:52 AM  Back in A fib/ flutter this AM. Initially appeared flutter, rapid AP unsuccessful, now appears to be A fib Will rebolus amiodarone and start dig as well Increase metoprolol to 25 BID  Remo Lipps C. Roxan Hockey, MD [facility] [phone]

## 2021-07-19 NOTE — Progress Notes (Signed)
[place] PA notified of patient not having voided yet since foley removed at approximately 0630. Bladder scan revealed 172 mL of urine. Orders received to continue bladder scanning every couple of hours and to monitor patient's respiratory status closely. Patient currently on room air.

## 2021-07-20 LAB — CBC
HCT: 25.9 % — ABNORMAL LOW (ref 39.0–52.0)
Hemoglobin: 8.5 g/dL — ABNORMAL LOW (ref 13.0–17.0)
MCH: 30.8 pg (ref 26.0–34.0)
MCHC: 32.8 g/dL (ref 30.0–36.0)
MCV: 93.8 fL (ref 80.0–100.0)
Platelets: 139 10*3/uL — ABNORMAL LOW (ref 150–400)
RBC: 2.76 MIL/uL — ABNORMAL LOW (ref 4.22–5.81)
RDW: 13.2 % (ref 11.5–15.5)
WBC: 5.9 10*3/uL (ref 4.0–10.5)
nRBC: 0.3 % — ABNORMAL HIGH (ref 0.0–0.2)

## 2021-07-20 LAB — GLUCOSE, CAPILLARY
Glucose-Capillary: 101 mg/dL — ABNORMAL HIGH (ref 70–99)
Glucose-Capillary: 111 mg/dL — ABNORMAL HIGH (ref 70–99)
Glucose-Capillary: 119 mg/dL — ABNORMAL HIGH (ref 70–99)
Glucose-Capillary: 124 mg/dL — ABNORMAL HIGH (ref 70–99)
Glucose-Capillary: 157 mg/dL — ABNORMAL HIGH (ref 70–99)
Glucose-Capillary: 165 mg/dL — ABNORMAL HIGH (ref 70–99)
Glucose-Capillary: 168 mg/dL — ABNORMAL HIGH (ref 70–99)

## 2021-07-20 LAB — BASIC METABOLIC PANEL
Anion gap: 11 (ref 5–15)
BUN: 19 mg/dL (ref 8–23)
CO2: 25 mmol/L (ref 22–32)
Calcium: 8.6 mg/dL — ABNORMAL LOW (ref 8.9–10.3)
Chloride: 100 mmol/L (ref 98–111)
Creatinine, Ser: 1.28 mg/dL — ABNORMAL HIGH (ref 0.61–1.24)
GFR, Estimated: 58 mL/min — ABNORMAL LOW (ref >60)
Glucose, Bld: 122 mg/dL — ABNORMAL HIGH (ref 70–99)
Potassium: 3.7 mmol/L (ref 3.5–5.1)
Sodium: 136 mmol/L (ref 135–145)

## 2021-07-20 MED ORDER — ZOLPIDEM TARTRATE 5 MG PO TABS: 5.0000 mg | ORAL_TABLET | Freq: Every evening | ORAL | Status: DC | PRN

## 2021-07-20 MED ORDER — SODIUM CHLORIDE 0.9% FLUSH
3.0000 mL | Freq: Two times a day (BID) | INTRAVENOUS | Status: DC
  Administered 2021-07-20 – 2021-07-25 (×10): 3 mL via INTRAVENOUS

## 2021-07-20 MED ORDER — AMIODARONE HCL 200 MG PO TABS
400.0000 mg | ORAL_TABLET | Freq: Two times a day (BID) | ORAL | Status: DC
  Administered 2021-07-20 – 2021-07-25 (×11): 400 mg via ORAL
  Filled 2021-07-20 (×11): qty 2

## 2021-07-20 MED ORDER — CHLORHEXIDINE GLUCONATE CLOTH 2 % EX PADS
6.0000 | MEDICATED_PAD | Freq: Every day | CUTANEOUS | Status: DC
  Administered 2021-07-21 – 2021-07-22 (×2): 6 via TOPICAL

## 2021-07-20 MED ORDER — INSULIN DETEMIR 100 UNIT/ML ~~LOC~~ SOLN
20.0000 [IU] | Freq: Two times a day (BID) | SUBCUTANEOUS | Status: DC
  Administered 2021-07-20: 20 [IU] via SUBCUTANEOUS
  Filled 2021-07-20 (×3): qty 0.2

## 2021-07-20 MED ORDER — POTASSIUM CHLORIDE CRYS ER 20 MEQ PO TBCR
40.0000 meq | EXTENDED_RELEASE_TABLET | Freq: Two times a day (BID) | ORAL | Status: DC
  Administered 2021-07-20 – 2021-07-21 (×4): 40 meq via ORAL
  Filled 2021-07-20 (×4): qty 2

## 2021-07-20 MED ORDER — ~~LOC~~ CARDIAC SURGERY, PATIENT & FAMILY EDUCATION: Freq: Once | Status: AC

## 2021-07-20 MED ORDER — SODIUM CHLORIDE 0.9 % IV SOLN: 250.0000 mL | INTRAVENOUS | Status: DC | PRN

## 2021-07-20 MED ORDER — SODIUM CHLORIDE 0.9% FLUSH
3.0000 mL | INTRAVENOUS | Status: DC | PRN
  Administered 2021-07-20: 3 mL via INTRAVENOUS

## 2021-07-20 MED ORDER — ALUM & MAG HYDROXIDE-SIMETH 200-200-20 MG/5ML PO SUSP: 15.0000 mL | Freq: Four times a day (QID) | ORAL | Status: DC | PRN

## 2021-07-20 MED ORDER — MAGNESIUM HYDROXIDE 400 MG/5ML PO SUSP: 30.0000 mL | Freq: Every day | ORAL | Status: DC | PRN

## 2021-07-20 NOTE — Progress Notes (Signed)
CARDIAC REHAB PHASE I   PRE:  Rate/Rhythm: 72 SR with PACs    BP: sitting 134/78    SaO2: 97 RA  MODE:  Ambulation: 270 ft   POST:  Rate/Rhythm: 110 ST with PACs    BP: sitting 135/78     SaO2: 98 RA  Pt brushed teeth before walk in bed. Moved to EOB independently from raised position. Used [place], standby assist. HR stable. Pt generally anxious, needed reminders to relax his shoulders and use legs more. To recliner. VSS. Encouraged IS, x3 walks a day.  [place], ACSM 07/20/2021 3:23 PM

## 2021-07-20 NOTE — Plan of Care (Signed)
  Problem: Education: Goal: Knowledge of General Education information will improve Description: Including pain rating scale, medication(s)/side effects and non-pharmacologic comfort measures Outcome: Progressing   Problem: Health Behavior/Discharge Planning: Goal: Ability to manage health-related needs will improve Outcome: Progressing   Problem: Clinical Measurements: Goal: Cardiovascular complication will be avoided Outcome: Progressing   Problem: Education: Goal: Understanding of CV disease, CV risk reduction, and recovery process will improve Outcome: Progressing Goal: Individualized Educational Video(s) Outcome: Progressing   Problem: Activity: Goal: Ability to return to baseline activity level will improve Outcome: Progressing   Problem: Cardiovascular: Goal: Ability to achieve and maintain adequate cardiovascular perfusion will improve Outcome: Progressing Goal: Vascular access site(s) Level 0-1 will be maintained Outcome: Progressing   Problem: Health Behavior/Discharge Planning: Goal: Ability to safely manage health-related needs after discharge will improve Outcome: Progressing   Problem: Education: Goal: Will demonstrate proper wound care and an understanding of methods to prevent future damage Outcome: Progressing Goal: Knowledge of disease or condition will improve Outcome: Progressing Goal: Knowledge of the prescribed therapeutic regimen will improve Outcome: Progressing Goal: Individualized Educational Video(s) Outcome: Progressing   Problem: Activity: Goal: Risk for activity intolerance will decrease Outcome: Progressing   Problem: Cardiac: Goal: Will achieve and/or maintain hemodynamic stability Outcome: Progressing   Problem: Clinical Measurements: Goal: Postoperative complications will be avoided or minimized Outcome: Progressing   Problem: Respiratory: Goal: Respiratory status will improve Outcome: Progressing   Problem: Skin  Integrity: Goal: Wound healing without signs and symptoms of infection Outcome: Progressing Goal: Risk for impaired skin integrity will decrease Outcome: Progressing   Problem: Urinary Elimination: Goal: Ability to achieve and maintain adequate renal perfusion and functioning will improve Outcome: Progressing

## 2021-07-20 NOTE — Progress Notes (Signed)
4 Days Post-Op Procedure(s) (LRB): CORONARY ARTERY BYPASS GRAFTING (CABG) X FIVE, USING LEFT INTERNAL MAMMARY ARTERY AND RIGHT LEG GREATER SAPHENOUS VEIN HARVESTED ENDOSCOPICALLY (N/A) TRANSESOPHAGEAL ECHOCARDIOGRAM (TEE) (N/A) APPLICATION OF CELL SAVER ENDOVEIN HARVEST OF GREATER SAPHENOUS VEIN Subjective: No complaints this AM  Objective: Vital signs in last 24 hours: Temp:  [98.9 F (37.2 C)-99.4 F (37.4 C)] 99.2 F (37.3 C) (09/16 0640) Pulse Rate:  [38-132] 38 (09/16 0700) Cardiac Rhythm: Atrial fibrillation (09/15 2000) Resp:  [17-40] 34 (09/16 0700) BP: (97-136)/(68-116) 109/79 (09/16 0633) SpO2:  [92 %-98 %] 98 % (09/16 0700) Weight:  [123.7 kg] 123.7 kg (09/16 0500)  Hemodynamic parameters for last 24 hours:    Intake/Output from previous day: 09/15 0701 - 09/16 0700 In: 547.6 [I.V.:547.6] Out: 1125 [Urine:1125] Intake/Output this shift: No intake/output data recorded.  General appearance: alert, cooperative, and no distress Neurologic: intact Heart: slightly irregular Lungs: diminished breath sounds bibasilar Abdomen: normal findings: soft, non-tender  Lab Results: Recent Labs    07/19/21 0425 07/20/21 0508  WBC 6.5 5.9  HGB 8.6* 8.5*  HCT 26.2* 25.9*  PLT 100* 139*   BMET:  Recent Labs    07/19/21 0425 07/20/21 0508  NA 135 136  K 3.8 3.7  CL 102 100  CO2 24 25  GLUCOSE 122* 122*  BUN 15 19  CREATININE 1.19 1.28*  CALCIUM 8.4* 8.6*    PT/INR: No results for input(s): LABPROT, INR in the last 72 hours. ABG    Component Value Date/Time   PHART 7.360 07/16/2021 2103   HCO3 20.7 07/16/2021 2103   TCO2 22 07/16/2021 2103   ACIDBASEDEF 4.0 (H) 07/16/2021 2103   O2SAT 98.0 07/16/2021 2103   CBG (last 3)  Recent Labs    07/20/21 0013 07/20/21 0341 07/20/21 0638  GLUCAP 124* 119* 101*    Assessment/Plan: S/P Procedure(s) (LRB): CORONARY ARTERY BYPASS GRAFTING (CABG) X FIVE, USING LEFT INTERNAL MAMMARY ARTERY AND RIGHT LEG GREATER  SAPHENOUS VEIN HARVESTED ENDOSCOPICALLY (N/A) TRANSESOPHAGEAL ECHOCARDIOGRAM (TEE) (N/A) APPLICATION OF CELL SAVER ENDOVEIN HARVEST OF GREATER SAPHENOUS VEIN - In Sr this AM, wtill with PACs- change amiodarone to PO  Continue digoxin and lopressor Continue IS Creatinine up slightly- continue PO Lasix for volume overload CBG well controlled, recheck renal function in Am before restarting metformin Continue cardiac rehab Transfer to 4E   LOS: 8 days    Melrose Nakayama 07/20/2021

## 2021-07-20 NOTE — Progress Notes (Addendum)
      [address]       [place], [address]             [phone]      4 Days Post-Op  Procedure(s) (LRB): CORONARY ARTERY BYPASS GRAFTING (CABG) X FIVE, USING LEFT INTERNAL MAMMARY ARTERY AND RIGHT LEG GREATER SAPHENOUS VEIN HARVESTED ENDOSCOPICALLY (N/A) TRANSESOPHAGEAL ECHOCARDIOGRAM (TEE) (N/A) APPLICATION OF CELL SAVER ENDOVEIN HARVEST OF GREATER SAPHENOUS VEIN   Total Length of Stay:  LOS: 8 days    SUBJECTIVE:  No new problems.   Vitals:   07/20/21 1600 07/20/21 1700  BP: 135/80   Pulse: 72 74  Resp: (!) 27 (!) 24  Temp:    SpO2: 97% 97%    Intake/Output      09/15 0701 09/16 0700 09/16 0701 09/17 0700   P.O.     I.V. (mL/kg) 547.6 (4.4) 72.3 (0.6)   IV Piggyback     Total Intake(mL/kg) 547.6 (4.4) 72.3 (0.6)   Urine (mL/kg/hr) 1125 (0.4) 900 (0.6)   Stool     Total Output 1125 900   Net -577.4 -827.7        Urine Occurrence 2 x 1 x       sodium chloride      CBC    Component Value Date/Time   WBC 5.9 07/20/2021 0508   RBC 2.76 (L) 07/20/2021 0508   HGB 8.5 (L) 07/20/2021 0508   HCT 25.9 (L) 07/20/2021 0508   PLT 139 (L) 07/20/2021 0508   MCV 93.8 07/20/2021 0508   MCH 30.8 07/20/2021 0508   MCHC 32.8 07/20/2021 0508   RDW 13.2 07/20/2021 0508   CMP     Component Value Date/Time   NA 136 07/20/2021 0508   NA 143 06/22/2021 0847   K 3.7 07/20/2021 0508   CL 100 07/20/2021 0508   CO2 25 07/20/2021 0508   GLUCOSE 122 (H) 07/20/2021 0508   BUN 19 07/20/2021 0508   BUN 12 06/22/2021 0847   CREATININE 1.28 (H) 07/20/2021 0508   CALCIUM 8.6 (L) 07/20/2021 0508   PROT 6.3 (L) 07/15/2021 0145   ALBUMIN 3.5 07/15/2021 0145   AST 23 07/15/2021 0145   ALT 28 07/15/2021 0145   ALKPHOS 67 07/15/2021 0145   BILITOT 0.5 07/15/2021 0145   GFRNONAA 58 (L) 07/20/2021 0508   GFRAA >60 08/15/2015 1833   ABG    Component Value Date/Time   PHART 7.360 07/16/2021 2103   PCO2ART 37.0 07/16/2021 2103   PO2ART 117 (H)  07/16/2021 2103   HCO3 20.7 07/16/2021 2103   TCO2 22 07/16/2021 2103   ACIDBASEDEF 4.0 (H) 07/16/2021 2103   O2SAT 98.0 07/16/2021 2103   CBG (last 3)  Recent Labs    07/20/21 0638 07/20/21 1103 07/20/21 1532  GLUCAP 101* 157* 111*     ASSESSMENT: Progressing with mobility.  Maintaining SR on oral amiodarone.  Sats good on RA.  Awaiting transfer to 4E. Anticipate he will be ready for discharge in 1-2 days.    Antony Odea, PA-C  Patient seen and examined, agree with above Waiting for tele bed  Revonda Standard. Roxan Hockey, MD [facility] [phone]

## 2021-07-21 ENCOUNTER — Inpatient Hospital Stay (HOSPITAL_COMMUNITY): Payer: Medicare PPO

## 2021-07-21 DIAGNOSIS — N179 Acute kidney failure, unspecified: Secondary | ICD-10-CM

## 2021-07-21 DIAGNOSIS — Z951 Presence of aortocoronary bypass graft: Secondary | ICD-10-CM | POA: Diagnosis not present

## 2021-07-21 DIAGNOSIS — I4891 Unspecified atrial fibrillation: Secondary | ICD-10-CM | POA: Diagnosis not present

## 2021-07-21 DIAGNOSIS — I25110 Atherosclerotic heart disease of native coronary artery with unstable angina pectoris: Secondary | ICD-10-CM | POA: Diagnosis not present

## 2021-07-21 LAB — BASIC METABOLIC PANEL
Anion gap: 12 (ref 5–15)
Anion gap: 8 (ref 5–15)
BUN: 25 mg/dL — ABNORMAL HIGH (ref 8–23)
BUN: 25 mg/dL — ABNORMAL HIGH (ref 8–23)
CO2: 23 mmol/L (ref 22–32)
CO2: 23 mmol/L (ref 22–32)
Calcium: 8.8 mg/dL — ABNORMAL LOW (ref 8.9–10.3)
Calcium: 8.7 mg/dL — ABNORMAL LOW (ref 8.9–10.3)
Chloride: 102 mmol/L (ref 98–111)
Chloride: 105 mmol/L (ref 98–111)
Creatinine, Ser: 1.32 mg/dL — ABNORMAL HIGH (ref 0.61–1.24)
Creatinine, Ser: 1.38 mg/dL — ABNORMAL HIGH (ref 0.61–1.24)
GFR, Estimated: 56 mL/min — ABNORMAL LOW (ref >60)
GFR, Estimated: 53 mL/min — ABNORMAL LOW (ref >60)
Glucose, Bld: 185 mg/dL — ABNORMAL HIGH (ref 70–99)
Glucose, Bld: 173 mg/dL — ABNORMAL HIGH (ref 70–99)
Potassium: 4 mmol/L (ref 3.5–5.1)
Potassium: 4.2 mmol/L (ref 3.5–5.1)
Sodium: 137 mmol/L (ref 135–145)
Sodium: 136 mmol/L (ref 135–145)

## 2021-07-21 LAB — GLUCOSE, CAPILLARY
Glucose-Capillary: 127 mg/dL — ABNORMAL HIGH (ref 70–99)
Glucose-Capillary: 105 mg/dL — ABNORMAL HIGH (ref 70–99)
Glucose-Capillary: 163 mg/dL — ABNORMAL HIGH (ref 70–99)
Glucose-Capillary: 134 mg/dL — ABNORMAL HIGH (ref 70–99)
Glucose-Capillary: 160 mg/dL — ABNORMAL HIGH (ref 70–99)
Glucose-Capillary: 172 mg/dL — ABNORMAL HIGH (ref 70–99)

## 2021-07-21 LAB — CBC
HCT: 27.6 % — ABNORMAL LOW (ref 39.0–52.0)
Hemoglobin: 9 g/dL — ABNORMAL LOW (ref 13.0–17.0)
MCH: 30.6 pg (ref 26.0–34.0)
MCHC: 32.6 g/dL (ref 30.0–36.0)
MCV: 93.9 fL (ref 80.0–100.0)
Platelets: 165 10*3/uL (ref 150–400)
RBC: 2.94 MIL/uL — ABNORMAL LOW (ref 4.22–5.81)
RDW: 13.3 % (ref 11.5–15.5)
WBC: 5.4 10*3/uL (ref 4.0–10.5)
nRBC: 0 % (ref 0.0–0.2)

## 2021-07-21 LAB — PROTIME-INR
INR: 1.2 (ref 0.8–1.2)
Prothrombin Time: 15.3 seconds — ABNORMAL HIGH (ref 11.4–15.2)

## 2021-07-21 LAB — MAGNESIUM: Magnesium: 1.8 mg/dL (ref 1.7–2.4)

## 2021-07-21 MED ORDER — INSULIN ASPART 100 UNIT/ML IJ SOLN
0.0000 [IU] | Freq: Three times a day (TID) | INTRAMUSCULAR | Status: DC
  Administered 2021-07-21: 3 [IU] via SUBCUTANEOUS
  Administered 2021-07-21 – 2021-07-24 (×5): 2 [IU] via SUBCUTANEOUS

## 2021-07-21 MED ORDER — INSULIN ASPART 100 UNIT/ML IJ SOLN: 0.0000 [IU] | Freq: Every day | INTRAMUSCULAR | Status: DC

## 2021-07-21 MED ORDER — AMIODARONE HCL 150 MG/3ML IV SOLN: 150.0000 mg | Freq: Once | INTRAVENOUS | Status: DC

## 2021-07-21 MED ORDER — AMIODARONE IV BOLUS ONLY 150 MG/100ML
150.0000 mg | Freq: Once | INTRAVENOUS | Status: AC
  Administered 2021-07-21: 150 mg via INTRAVENOUS
  Filled 2021-07-21: qty 100

## 2021-07-21 MED ORDER — APIXABAN 5 MG PO TABS
5.0000 mg | ORAL_TABLET | Freq: Two times a day (BID) | ORAL | Status: DC
Start: 2021-07-21 — End: 2021-07-25
  Administered 2021-07-21 – 2021-07-25 (×8): 5 mg via ORAL
  Filled 2021-07-21 (×8): qty 1

## 2021-07-21 MED ORDER — FUROSEMIDE 20 MG PO TABS: 20.0000 mg | ORAL_TABLET | Freq: Every day | ORAL | Status: DC

## 2021-07-21 MED ORDER — ASPIRIN EC 81 MG PO TBEC
81.0000 mg | DELAYED_RELEASE_TABLET | Freq: Every day | ORAL | Status: DC
  Administered 2021-07-21 – 2021-07-25 (×5): 81 mg via ORAL
  Filled 2021-07-21 (×5): qty 1

## 2021-07-21 MED ORDER — METFORMIN HCL 500 MG PO TABS
1000.0000 mg | ORAL_TABLET | Freq: Every day | ORAL | Status: DC
  Administered 2021-07-21 – 2021-07-24 (×4): 1000 mg via ORAL
  Filled 2021-07-21 (×4): qty 2

## 2021-07-21 MED ORDER — METOPROLOL TARTRATE 25 MG PO TABS
25.0000 mg | ORAL_TABLET | Freq: Two times a day (BID) | ORAL | Status: DC
  Administered 2021-07-21 (×2): 25 mg via ORAL
  Filled 2021-07-21 (×2): qty 1

## 2021-07-21 MED ORDER — METFORMIN HCL 500 MG PO TABS
500.0000 mg | ORAL_TABLET | Freq: Every day | ORAL | Status: DC
  Administered 2021-07-21 – 2021-07-25 (×5): 500 mg via ORAL
  Filled 2021-07-21 (×5): qty 1

## 2021-07-21 NOTE — Progress Notes (Signed)
      [address]       [place] [address]             [phone]      A fib still limiting activity  BP 105/70 (BP Location: Left Arm)   Pulse (!) 113   Temp 98.8 F (37.1 C) (Oral)   Resp (!) 36   Ht 6' (1.829 m)   Wt 123.7 kg   SpO2 97%   BMI 36.99 kg/m   Intake/Output Summary (Last 24 hours) at 07/21/2021 1715 Last data filed at 07/21/2021 1500 Gross per 24 hour  Intake 328.81 ml  Output 725 ml  Net -396.19 ml   Still awaiting bed on 4e  To start [place]. Roxan Hockey, MD [facility] [phone]

## 2021-07-21 NOTE — Progress Notes (Signed)
[place] for apixaban Indication: atrial fibrillation  Allergies  Allergen Reactions   Yellow Fever Vaccine Hives    Patient Measurements: Height: 6' (182.9 cm) Weight: 123.7 kg (272 lb 11.3 oz) IBW/kg (Calculated) : 77.6  Vital Signs: Temp: 99.1 F (37.3 C) (09/17 0650) Temp Source: Oral (09/17 0650) BP: 118/67 (09/17 0700) Pulse Rate: 76 (09/17 0700)  Labs: Recent Labs    07/19/21 0425 07/20/21 0508 07/21/21 0114  HGB 8.6* 8.5* 9.0*  HCT 26.2* 25.9* 27.6*  PLT 100* 139* 165  CREATININE 1.19 1.28* 1.32*    Estimated Creatinine Clearance: 65.7 mL/min (A) (by C-G formula based on SCr of 1.32 mg/dL (H)).   Medical History: Past Medical History:  Diagnosis Date   Anesthesia complication    PER PT, "HARD TO WAKE' UP PAST SHOULDER SURGERY IN 2016   Arthritis    lumbar, spondylosis, DDD,stenosis    Atrial premature depolarization    Chronic right hip pain    and right leg pain   Coronary artery disease    Diabetes mellitus without complication (HCC)    Dysrhythmia    pt. reports that he has been told in the past that he has  irreg. heattbeat    Edema    Gout    bil ankles   Hyperlipidemia    Hypertension    Rotator cuff tear    right   Shortness of breath    Swelling of extremity    bil feet   Type 2 diabetes mellitus with hyperglycemia ([place])     Assessment: 75 yoM s/p CABG 9/12 w/ post-op AFib. Pharmacy asked to begin apixaban for anticoagulation this evening. SCr 1.3, age 75, wt, >100kg.    Plan:  Apixaban '5mg'$  BID starting tonight Pharmacy will sign off, reconsult as needed  Arrie Senate, PharmD, BCPS, [facility] Clinical Pharmacist [phone] Please check AMION for all [place] numbers 07/21/2021

## 2021-07-21 NOTE — Progress Notes (Signed)
Progress Note  Patient Name: Oscar [name] Date of Encounter: 07/21/2021  [facility] Cardiologist: Pixie Casino, MD   Patient Profile     75 y.o. male with history of DM 2, obesity, HLD and HTN. Presents with 3 week history of progressive chest tightness. CTA c/w severe CAD Cath 3V CAD  s/p CABG EF 55%  Subjective   Without chest pain or SOB - noted to have afib with RVR - has been on amiodarone, digoxin and metoprolol. Some NSVT as well - he is 4.5L negative on 40 mg daily lasix -was on PRN 20 mg lasix at home - LVEF normal on echo.  Inpatient Medications    Scheduled Meds:  acetaminophen  1,000 mg Oral Q6H   Or   acetaminophen (TYLENOL) oral liquid 160 mg/5 mL  1,000 mg Per Tube Q6H   amiodarone  400 mg Oral BID   apixaban  5 mg Oral BID   aspirin EC  81 mg Oral Daily   atorvastatin  80 mg Oral Daily   bisacodyl  10 mg Oral Daily   Or   bisacodyl  10 mg Rectal Daily   Chlorhexidine Gluconate Cloth  6 each Topical Daily   colchicine  0.6 mg Oral Q breakfast   digoxin  0.125 mg Oral Daily   docusate sodium  200 mg Oral Daily   furosemide  40 mg Oral Daily   insulin aspart  0-15 Units Subcutaneous TID WC   insulin aspart  0-5 Units Subcutaneous QHS   insulin aspart  5 Units Subcutaneous TID WC   metFORMIN  1,000 mg Oral Q supper   metFORMIN  500 mg Oral Q breakfast   metoprolol tartrate  25 mg Oral BID   pantoprazole  40 mg Oral Daily   potassium chloride  40 mEq Oral BID   sodium chloride flush  3 mL Intravenous Q12H   Thrombi-Pad  1 each Topical Once   Continuous Infusions:  sodium chloride     PRN Meds: sodium chloride, alum & mag hydroxide-simeth, magnesium hydroxide, metoprolol tartrate, ondansetron (ZOFRAN) IV, oxyCODONE, sodium chloride flush, zolpidem   Vital Signs    Vitals:   07/21/21 0700 07/21/21 0800 07/21/21 0900 07/21/21 1000  BP: 118/67 118/70 (!) 129/102 125/82  Pulse: 76 77 (!) 110 (!) 125  Resp: 20 (!) 26 (!) 51 (!) 25  Temp:       TempSrc:      SpO2: 97% 96% 99% 96%  Weight:      Height:        Intake/Output Summary (Last 24 hours) at 07/21/2021 1147 Last data filed at 07/21/2021 0600 Gross per 24 hour  Intake 245.81 ml  Output 1075 ml  Net -829.19 ml   Last 3 Weights 07/21/2021 07/20/2021 07/19/2021  Weight (lbs) 272 lb 11.3 oz 272 lb 11.3 oz 272 lb 14.9 oz  Weight (kg) 123.7 kg 123.7 kg 123.8 kg      Telemetry    Afib with RVR, runs of NSVT- Personally Reviewed  ECG   N/A  Physical Exam   General appearance: alert, no distress, and moderately obese Neck: no carotid bruit, no JVD, and thyroid not enlarged, symmetric, no tenderness/mass/nodules Lungs: clear to auscultation bilaterally Heart: irregularly irregular rhythm and tachycardic, midline sternotomy scar noted Abdomen: obese Extremities: extremities normal, atraumatic, no cyanosis or edema Pulses: 2+ and symmetric Skin: Skin color, texture, turgor normal. No rashes or lesions Neurologic: Grossly normal Psych: Pleasant   Labs  High Sensitivity Troponin:  No results for input(s): TROPONINIHS in the last 720 hours.    Chemistry Recent Labs  Lab 07/15/21 0145 07/16/21 0416 07/19/21 0425 07/20/21 0508 07/21/21 0114  NA 139   < > 135 136 137  K 4.4   < > 3.8 3.7 4.0  CL 106   < > 102 100 102  CO2 26   < > '24 25 23  '$ GLUCOSE 123*   < > 122* 122* 185*  BUN 14   < > 15 19 25*  CREATININE 1.32*   < > 1.19 1.28* 1.32*  CALCIUM 9.1   < > 8.4* 8.6* 8.8*  PROT 6.3*  --   --   --   --   ALBUMIN 3.5  --   --   --   --   AST 23  --   --   --   --   ALT 28  --   --   --   --   ALKPHOS 67  --   --   --   --   BILITOT 0.5  --   --   --   --   GFRNONAA 56*   < > >60 58* 56*  ANIONGAP 7   < > '9 11 12   '$ < > = values in this interval not displayed.     Hematology Recent Labs  Lab 07/19/21 0425 07/20/21 0508 07/21/21 0114  WBC 6.5 5.9 5.4  RBC 2.78* 2.76* 2.94*  HGB 8.6* 8.5* 9.0*  HCT 26.2* 25.9* 27.6*  MCV 94.2 93.8 93.9  MCH 30.9  30.8 30.6  MCHC 32.8 32.8 32.6  RDW 12.9 13.2 13.3  PLT 100* 139* 165    BNPNo results for input(s): BNP, PROBNP in the last 168 hours.   DDimer No results for input(s): DDIMER in the last 168 hours.   Radiology    DG Chest Port 1 View  Result Date: 07/21/2021 CLINICAL DATA:  Chest pain.  Evaluate for atelectasis. EXAM: PORTABLE CHEST 1 VIEW COMPARISON:  July 19, 2021 FINDINGS: No pneumothorax. Stable cardiomegaly. The hila and mediastinum are unremarkable. Small effusion and underlying atelectasis in the left base. No other acute abnormalities. IMPRESSION: Small effusion and underlying atelectasis in the left base. No other abnormalities. Electronically Signed   By: Dorise Bullion III M.D.   On: 07/21/2021 10:00    Cardiac Studies   Procedures  LEFT HEART CATH AND CORONARY ANGIOGRAPHY   Conclusion      Prox RCA lesion is 50% stenosed.   Dist RCA lesion is 80% stenosed.   Prox Cx lesion is 50% stenosed.   1st Mrg lesion is 70% stenosed.   2nd Mrg lesion is 80% stenosed.   Mid Cx to Dist Cx lesion is 90% stenosed.   Prox LAD to Mid LAD lesion is 75% stenosed.   Mid LAD lesion is 70% stenosed.   Dist LAD lesion is 99% stenosed.   1st Diag lesion is 50% stenosed.   The left ventricular systolic function is normal.   LV end diastolic pressure is mildly elevated.   The left ventricular ejection fraction is 55-65% by visual estimate.   There is no mitral valve regurgitation.   Severe three vessel CAD The LAD is a large caliber vessel that courses to the apex giving off one large diagonal branch. The mid LAD has a severe stenosis just after the takeoff of the Diagonal branch. There is diffuse disease in the mid LAD beyond  this stenosis. Severe distal LAD stenosis. The Diagonal branch has moderate non-obstructive disease.  The Circumflex gives off several moderate caliber obtuse marginal branches. The first OM branch has moderately severe proximal stenosis. The second obtuse  marginal branch has moderately severe proximal stenosis. The AV groove Circumflex has severe stenosis in the mid segment beyond the takeoff of OM2.  The RCA is a large, calcified vessel with an aneurysmal mid segment and heavily calcified, severe distal stenosis.  Normal LV systolic function    Recommendations: Will consult CT surgery for CABG in this diabetic patient with severe, multi-vessel CAD. Continue ASA, statin and beta blocker. Echo today. Admit to telemetry.    Coronary Diagrams  Diagnostic Dominance: Right Intervention    Assessment & Plan    Progressive angina - Cardiac cath demonstrates severe segmental 3 vessel disease. Poorly suited for PCI - now s/p CABG., POD #5. No chest pain, improving. Echo showed normal LV function. Afib with RVR - this is a post-operative issue. Has been on amiodarone, digoxin and metoprolol. Continue po amiodarone 400 mg BID. Will give extra 150 mg IV amiodarone today. Continue digoxin and metoprolol. Check dig level after 5 days. Continue low dose aspirin and Eliquis 5 mg BID. DM - resume metformin. HTN - controlled HLD - On high dose crestor now.  AKI - creatinine trending up to 1.32 yesterday - would stop lasix. He was using it PRN prior to arrival. He is 4.4L negative since admission. Echo showed normal LV function. May be getting dry which could contribute to worsening afib.   Pixie Casino, MD, [facility], [place] Director of the Advanced Lipid Disorders &  Cardiovascular Risk Reduction Clinic Diplomate of the American Board of Clinical Lipidology Attending Cardiologist  Direct Dial: [phone]  Fax: [phone]  Website:  [email or web address]  Pixie Casino, MD  07/21/2021, 11:47 AM

## 2021-07-21 NOTE — Progress Notes (Signed)
CARDIAC REHAB PHASE I   Returned to offer to walk with pt. Pt finally with IV access, but still symptomatic with HR/rhythm. Pt states he stood and marched in place with RN, became fatigued and SOB. Pt returned to bed. Encouraged continued IS use, sitting in recliner, and walk later after medications administered as able. Provided support and encouragement. Will continue to follow.  [id number] Rufina Falco, RN BSN 07/21/2021 1:28 PM

## 2021-07-21 NOTE — Progress Notes (Signed)
CARDIAC REHAB PHASE I   Offered to walk with pt. Pt requesting to walk closer to lunch. Will return to ambulate.  Rufina Falco, RN BSN 07/21/2021 9:14 AM

## 2021-07-21 NOTE — Progress Notes (Signed)
5 Days Post-Op Procedure(s) (LRB): CORONARY ARTERY BYPASS GRAFTING (CABG) X FIVE, USING LEFT INTERNAL MAMMARY ARTERY AND RIGHT LEG GREATER SAPHENOUS VEIN HARVESTED ENDOSCOPICALLY (N/A) TRANSESOPHAGEAL ECHOCARDIOGRAM (TEE) (N/A) APPLICATION OF CELL SAVER ENDOVEIN HARVEST OF GREATER SAPHENOUS VEIN Subjective: Tired this AM  Objective: Vital signs in last 24 hours: Temp:  [98.6 F (37 C)-99.4 F (37.4 C)] 99.1 F (37.3 C) (09/17 0650) Pulse Rate:  [51-133] 76 (09/17 0700) Cardiac Rhythm: Normal sinus rhythm (09/17 0400) Resp:  [17-40] 20 (09/17 0700) BP: (109-149)/(65-98) 118/67 (09/17 0700) SpO2:  [93 %-97 %] 97 % (09/17 0700) Weight:  [123.7 kg] 123.7 kg (09/17 0500)  Hemodynamic parameters for last 24 hours:    Intake/Output from previous day: 09/16 0701 - 09/17 0700 In: 312.3 [P.O.:240; I.V.:72.3] Out: 1325 [Urine:1325] Intake/Output this shift: No intake/output data recorded.  General appearance: alert, cooperative, and no distress Neurologic: intact Heart: irregularly irregular rhythm Lungs: diminished breath sounds bibasilar Wound: clean and dry  Lab Results: Recent Labs    07/20/21 0508 07/21/21 0114  WBC 5.9 5.4  HGB 8.5* 9.0*  HCT 25.9* 27.6*  PLT 139* 165   BMET:  Recent Labs    07/20/21 0508 07/21/21 0114  NA 136 137  K 3.7 4.0  CL 100 102  CO2 25 23  GLUCOSE 122* 185*  BUN 19 25*  CREATININE 1.28* 1.32*  CALCIUM 8.6* 8.8*    PT/INR: No results for input(s): LABPROT, INR in the last 72 hours. ABG    Component Value Date/Time   PHART 7.360 07/16/2021 2103   HCO3 20.7 07/16/2021 2103   TCO2 22 07/16/2021 2103   ACIDBASEDEF 4.0 (H) 07/16/2021 2103   O2SAT 98.0 07/16/2021 2103   CBG (last 3)  Recent Labs    07/20/21 2337 07/21/21 0414 07/21/21 0613  GLUCAP 165* 127* 105*    Assessment/Plan: S/P Procedure(s) (LRB): CORONARY ARTERY BYPASS GRAFTING (CABG) X FIVE, USING LEFT INTERNAL MAMMARY ARTERY AND RIGHT LEG GREATER SAPHENOUS VEIN  HARVESTED ENDOSCOPICALLY (N/A) TRANSESOPHAGEAL ECHOCARDIOGRAM (TEE) (N/A) APPLICATION OF CELL SAVER ENDOVEIN HARVEST OF GREATER SAPHENOUS VEIN POD # 5 CV- still going in and out of A fib  Continue amiodarone, dig and lopressor, increase lopressor to 25 BID  Will decrease ASA to 81 mg and start Eliquis since A fib is persistent RESP_ continue IS RENAL- creatinine stable, continue PO Lasix ENDO- CBG mildly elevated on levemir  Stop levemir, restart metformin GI- tolerating PO Continue ambulation   LOS: 9 days    [place] Nakayama 07/21/2021

## 2021-07-22 DIAGNOSIS — I480 Paroxysmal atrial fibrillation: Secondary | ICD-10-CM

## 2021-07-22 LAB — BASIC METABOLIC PANEL
Anion gap: 10 (ref 5–15)
BUN: 23 mg/dL (ref 8–23)
CO2: 22 mmol/L (ref 22–32)
Calcium: 8.6 mg/dL — ABNORMAL LOW (ref 8.9–10.3)
Chloride: 107 mmol/L (ref 98–111)
Creatinine, Ser: 1.4 mg/dL — ABNORMAL HIGH (ref 0.61–1.24)
GFR, Estimated: 52 mL/min — ABNORMAL LOW (ref >60)
Glucose, Bld: 142 mg/dL — ABNORMAL HIGH (ref 70–99)
Potassium: 4.5 mmol/L (ref 3.5–5.1)
Sodium: 139 mmol/L (ref 135–145)

## 2021-07-22 LAB — GLUCOSE, CAPILLARY
Glucose-Capillary: 126 mg/dL — ABNORMAL HIGH (ref 70–99)
Glucose-Capillary: 113 mg/dL — ABNORMAL HIGH (ref 70–99)
Glucose-Capillary: 114 mg/dL — ABNORMAL HIGH (ref 70–99)
Glucose-Capillary: 121 mg/dL — ABNORMAL HIGH (ref 70–99)

## 2021-07-22 MED ORDER — POTASSIUM CHLORIDE CRYS ER 20 MEQ PO TBCR
20.0000 meq | EXTENDED_RELEASE_TABLET | Freq: Every day | ORAL | Status: DC
  Administered 2021-07-22: 20 meq via ORAL
  Filled 2021-07-22: qty 1

## 2021-07-22 MED ORDER — METOPROLOL TARTRATE 25 MG PO TABS
37.5000 mg | ORAL_TABLET | Freq: Two times a day (BID) | ORAL | Status: DC
  Administered 2021-07-22 – 2021-07-23 (×4): 37.5 mg via ORAL
  Filled 2021-07-22 (×4): qty 1

## 2021-07-22 MED ORDER — MAGNESIUM OXIDE -MG SUPPLEMENT 400 (240 MG) MG PO TABS
200.0000 mg | ORAL_TABLET | Freq: Every day | ORAL | Status: DC
  Administered 2021-07-22 – 2021-07-25 (×4): 200 mg via ORAL
  Filled 2021-07-22 (×4): qty 1

## 2021-07-22 MED ORDER — LOPERAMIDE HCL 1 MG/7.5ML PO SUSP
1.0000 mg | ORAL | Status: DC | PRN
  Filled 2021-07-22: qty 7.5

## 2021-07-22 NOTE — Plan of Care (Signed)
  Problem: Education: Goal: Knowledge of General Education information will improve Description: Including pain rating scale, medication(s)/side effects and non-pharmacologic comfort measures Outcome: Progressing   Problem: Health Behavior/Discharge Planning: Goal: Ability to manage health-related needs will improve Outcome: Progressing   Problem: Clinical Measurements: Goal: Cardiovascular complication will be avoided Outcome: Progressing   Problem: Activity: Goal: Ability to return to baseline activity level will improve Outcome: Progressing

## 2021-07-22 NOTE — Progress Notes (Addendum)
Patient arrived from [facility] to [id number]. Patient assisted to restroom from wheel chair. Report given to oncoming RN  Dinah Lupa, Bettina Gavia RN

## 2021-07-22 NOTE — Progress Notes (Signed)
      [address]       [place] [address]             [phone]      BP 118/76   Pulse (!) 111   Temp 98.7 F (37.1 C) (Oral)   Resp (!) 21   Ht 6' (1.829 m)   Wt 123.7 kg   SpO2 95%   BMI 36.99 kg/m  In SR currently   Intake/Output Summary (Last 24 hours) at 07/22/2021 1724 Last data filed at 07/22/2021 0800 Gross per 24 hour  Intake 240 ml  Output 300 ml  Net -60 ml   Stable day - has bed on tele unit  Remo Lipps C. Roxan Hockey, MD [facility] [phone]

## 2021-07-22 NOTE — Progress Notes (Signed)
   Progress Note  Patient Name: Oscar [name] Date of Encounter: 07/22/2021  Primary Cardiologist: Pixie Casino, MD  Telemetry reviewed.  Patient in sinus rhythm this morning.  Still has had bursts of atrial ectopy and PAF in the last 24 hours.  Medications reviewed, has been started back on Eliquis.  Inpatient Medications    Scheduled Meds:  amiodarone  400 mg Oral BID   apixaban  5 mg Oral BID   aspirin EC  81 mg Oral Daily   atorvastatin  80 mg Oral Daily   bisacodyl  10 mg Oral Daily   Or   bisacodyl  10 mg Rectal Daily   Chlorhexidine Gluconate Cloth  6 each Topical Daily   colchicine  0.6 mg Oral Q breakfast   digoxin  0.125 mg Oral Daily   docusate sodium  200 mg Oral Daily   insulin aspart  0-15 Units Subcutaneous TID WC   insulin aspart  0-5 Units Subcutaneous QHS   insulin aspart  5 Units Subcutaneous TID WC   metFORMIN  1,000 mg Oral Q supper   metFORMIN  500 mg Oral Q breakfast   metoprolol tartrate  25 mg Oral BID   pantoprazole  40 mg Oral Daily   potassium chloride  40 mEq Oral BID   sodium chloride flush  3 mL Intravenous Q12H   Thrombi-Pad  1 each Topical Once   No change in present amiodarone dosing, also on Lopressor and Lanoxin.  Signed, Rozann Lesches, MD  07/22/2021, 7:51 AM

## 2021-07-22 NOTE — Progress Notes (Signed)
6 Days Post-Op Procedure(s) (LRB): CORONARY ARTERY BYPASS GRAFTING (CABG) X FIVE, USING LEFT INTERNAL MAMMARY ARTERY AND RIGHT LEG GREATER SAPHENOUS VEIN HARVESTED ENDOSCOPICALLY (N/A) TRANSESOPHAGEAL ECHOCARDIOGRAM (TEE) (N/A) APPLICATION OF CELL SAVER ENDOVEIN HARVEST OF GREATER SAPHENOUS VEIN Subjective: C/o diarrhea over night and this AM  Objective: Vital signs in last 24 hours: Temp:  [98.7 F (37.1 C)-99.5 F (37.5 C)] 99.5 F (37.5 C) (09/18 0400) Pulse Rate:  [51-125] 73 (09/18 0500) Cardiac Rhythm: Heart block;Other (Comment) (09/17 1930) Resp:  [17-51] 21 (09/18 0500) BP: (102-156)/(61-132) 145/73 (09/18 0500) SpO2:  [91 %-99 %] 96 % (09/18 0500) Weight:  [123.7 kg] 123.7 kg (09/18 0500)  Hemodynamic parameters for last 24 hours:    Intake/Output from previous day: 09/17 0701 - 09/18 0700 In: 88.8 [I.V.:88.8] Out: 800 [Urine:800] Intake/Output this shift: No intake/output data recorded.  General appearance: alert, cooperative, and no distress Neurologic: intact Heart: irregularly irregular rhythm Lungs: diminished breath sounds bibasilar Abdomen: normal findings: soft, non-tender Wound: clean and dry  Lab Results: Recent Labs    07/20/21 0508 07/21/21 0114  WBC 5.9 5.4  HGB 8.5* 9.0*  HCT 25.9* 27.6*  PLT 139* 165   BMET:  Recent Labs    07/21/21 1101 07/22/21 0535  NA 136 139  K 4.2 4.5  CL 105 107  CO2 23 22  GLUCOSE 173* 142*  BUN 25* 23  CREATININE 1.38* 1.40*  CALCIUM 8.7* 8.6*    PT/INR:  Recent Labs    07/21/21 1101  LABPROT 15.3*  INR 1.2   ABG    Component Value Date/Time   PHART 7.360 07/16/2021 2103   HCO3 20.7 07/16/2021 2103   TCO2 22 07/16/2021 2103   ACIDBASEDEF 4.0 (H) 07/16/2021 2103   O2SAT 98.0 07/16/2021 2103   CBG (last 3)  Recent Labs    07/21/21 1920 07/21/21 2107 07/22/21 0657  GLUCAP 160* 172* 126*    Assessment/Plan: S/P Procedure(s) (LRB): CORONARY ARTERY BYPASS GRAFTING (CABG) X FIVE, USING  LEFT INTERNAL MAMMARY ARTERY AND RIGHT LEG GREATER SAPHENOUS VEIN HARVESTED ENDOSCOPICALLY (N/A) TRANSESOPHAGEAL ECHOCARDIOGRAM (TEE) (N/A) APPLICATION OF CELL SAVER ENDOVEIN HARVEST OF GREATER SAPHENOUS VEIN - CV- in and out of A fib, on amiodarone, digoxin and metoprolol  Increase metoprolol to 37.5 mg BID  Started on Eliquis last night RESP- continue IS RENAL- creatinine stable and lytes OK  Decrease K dose ENDO- CBG reasonable control with restarting metformin GI- diarrhea, will give immodium PRN Continue cardiac rehab   LOS: 10 days    Melrose Nakayama 07/22/2021

## 2021-07-23 LAB — BASIC METABOLIC PANEL
Anion gap: 10 (ref 5–15)
BUN: 22 mg/dL (ref 8–23)
CO2: 23 mmol/L (ref 22–32)
Calcium: 8.7 mg/dL — ABNORMAL LOW (ref 8.9–10.3)
Chloride: 103 mmol/L (ref 98–111)
Creatinine, Ser: 1.32 mg/dL — ABNORMAL HIGH (ref 0.61–1.24)
GFR, Estimated: 56 mL/min — ABNORMAL LOW (ref >60)
Glucose, Bld: 110 mg/dL — ABNORMAL HIGH (ref 70–99)
Potassium: 4.2 mmol/L (ref 3.5–5.1)
Sodium: 136 mmol/L (ref 135–145)

## 2021-07-23 LAB — GLUCOSE, CAPILLARY
Glucose-Capillary: 124 mg/dL — ABNORMAL HIGH (ref 70–99)
Glucose-Capillary: 118 mg/dL — ABNORMAL HIGH (ref 70–99)
Glucose-Capillary: 137 mg/dL — ABNORMAL HIGH (ref 70–99)
Glucose-Capillary: 136 mg/dL — ABNORMAL HIGH (ref 70–99)

## 2021-07-23 MED ORDER — FUROSEMIDE 40 MG PO TABS
40.0000 mg | ORAL_TABLET | Freq: Every day | ORAL | Status: DC
  Administered 2021-07-23 – 2021-07-25 (×3): 40 mg via ORAL
  Filled 2021-07-23 (×3): qty 1

## 2021-07-23 MED ORDER — POTASSIUM CHLORIDE CRYS ER 20 MEQ PO TBCR
20.0000 meq | EXTENDED_RELEASE_TABLET | Freq: Every day | ORAL | Status: DC
  Administered 2021-07-23 – 2021-07-25 (×3): 20 meq via ORAL
  Filled 2021-07-23 (×3): qty 1

## 2021-07-23 NOTE — Progress Notes (Addendum)
[address]       [place] [address]             [phone]      7 Days Post-Op Procedure(s) (LRB): CORONARY ARTERY BYPASS GRAFTING (CABG) X FIVE, USING LEFT INTERNAL MAMMARY ARTERY AND RIGHT LEG GREATER SAPHENOUS VEIN HARVESTED ENDOSCOPICALLY (N/A) TRANSESOPHAGEAL ECHOCARDIOGRAM (TEE) (N/A) APPLICATION OF CELL SAVER ENDOVEIN HARVEST OF GREATER SAPHENOUS VEIN Subjective:  Feels pretty well  Objective: Vital signs in last 24 hours: Temp:  [98.7 F (37.1 C)-99.6 F (37.6 C)] 99 F (37.2 C) (09/19 0340) Pulse Rate:  [48-111] 68 (09/19 0340) Cardiac Rhythm: Normal sinus rhythm;Other (Comment) (09/18 1924) Resp:  [13-31] 18 (09/19 0340) BP: (90-137)/(58-82) 116/69 (09/19 0340) SpO2:  [95 %-100 %] 99 % (09/19 0340) Weight:  [121.7 kg-122.3 kg] 122.3 kg (09/19 0559)  Hemodynamic parameters for last 24 hours:    Intake/Output from previous day: 09/18 0701 - 09/19 0700 In: 480 [P.O.:480] Out: 450 [Urine:450] Intake/Output this shift: No intake/output data recorded.  General appearance: alert, cooperative, and no distress Heart: irregularly irregular rhythm Lungs: min dim in bases Abdomen: benign Extremities: minor edema Wound: incis healing well  Lab Results: Recent Labs    07/21/21 0114  WBC 5.4  HGB 9.0*  HCT 27.6*  PLT 165   BMET:  Recent Labs    07/22/21 0535 07/23/21 0256  NA 139 136  K 4.5 4.2  CL 107 103  CO2 22 23  GLUCOSE 142* 110*  BUN 23 22  CREATININE 1.40* 1.32*  CALCIUM 8.6* 8.7*    PT/INR:  Recent Labs    07/21/21 1101  LABPROT 15.3*  INR 1.2   ABG    Component Value Date/Time   PHART 7.360 07/16/2021 2103   HCO3 20.7 07/16/2021 2103   TCO2 22 07/16/2021 2103   ACIDBASEDEF 4.0 (H) 07/16/2021 2103   O2SAT 98.0 07/16/2021 2103   CBG (last 3)  Recent Labs    07/22/21 1529 07/22/21 2137 07/23/21 0542  GLUCAP 114* 121* 124*    Meds Scheduled Meds:  amiodarone  400 mg Oral BID   apixaban  5 mg  Oral BID   aspirin EC  81 mg Oral Daily   atorvastatin  80 mg Oral Daily   Chlorhexidine Gluconate Cloth  6 each Topical Daily   colchicine  0.6 mg Oral Q breakfast   digoxin  0.125 mg Oral Daily   insulin aspart  0-15 Units Subcutaneous TID WC   insulin aspart  0-5 Units Subcutaneous QHS   insulin aspart  5 Units Subcutaneous TID WC   magnesium oxide  200 mg Oral Daily   metFORMIN  1,000 mg Oral Q supper   metFORMIN  500 mg Oral Q breakfast   metoprolol tartrate  37.5 mg Oral BID   pantoprazole  40 mg Oral Daily   potassium chloride  20 mEq Oral Daily   sodium chloride flush  3 mL Intravenous Q12H   Thrombi-Pad  1 each Topical Once   Continuous Infusions:  sodium chloride     PRN Meds:.sodium chloride, alum & mag hydroxide-simeth, loperamide HCl, metoprolol tartrate, ondansetron (ZOFRAN) IV, oxyCODONE, sodium chloride flush, zolpidem  Xrays DG Chest Port 1 View  Result Date: 07/21/2021 CLINICAL DATA:  Chest pain.  Evaluate for atelectasis. EXAM: PORTABLE CHEST 1 VIEW COMPARISON:  July 19, 2021 FINDINGS: No pneumothorax. Stable cardiomegaly. The hila and mediastinum are unremarkable. Small effusion and underlying atelectasis in the left base. No other  acute abnormalities. IMPRESSION: Small effusion and underlying atelectasis in the left base. No other abnormalities. Electronically Signed   By: Dorise Bullion III M.D.   On: 07/21/2021 10:00    Assessment/Plan: S/P Procedure(s) (LRB): CORONARY ARTERY BYPASS GRAFTING (CABG) X FIVE, USING LEFT INTERNAL MAMMARY ARTERY AND RIGHT LEG GREATER SAPHENOUS VEIN HARVESTED ENDOSCOPICALLY (N/A) TRANSESOPHAGEAL ECHOCARDIOGRAM (TEE) (N/A) APPLICATION OF CELL SAVER ENDOVEIN HARVEST OF GREATER SAPHENOUS VEIN  POD#7  1 Tmax 99.6, VSS s BP 90's-130's, sinus with PAC's on amio, dig and eliquis 2 sats good on RA 3 UOP appears adeq, unmeasured voids. Weight stable- some edema, not currently on diuretic 4 creat stable, improved from prev level  but fluctuating in 1.2-1.4 range 5 BS adeq control on metformin, SSI/small doses of meal coverage- Hg A1c well controlled 5.3 6 cont rehab/pulm toilet- has been somewhat slow with ambulation but is improving 7 home soon    LOS: 11 days    John Giovanni PA-C Pager [phone] 07/23/2021   Patient seen and examined, agree with above He does need lasix ( was on PRN at home)  Remo Lipps C. Roxan Hockey, MD [facility] [phone]

## 2021-07-23 NOTE — Progress Notes (Signed)
CARDIAC REHAB PHASE I   PRE:  Rate/Rhythm: 65 SR    BP: sitting 97/68    SaO2: 97 RA  MODE:  Ambulation: 260 ft   POST:  Rate/Rhythm: 82 SR with occ PAC    BP: sitting 134/78     SaO2: 96 RA  Pt just woke, ready to walk. Min-mod assist to get to EOB and stand. Used RW, needed verbal cues for correct position. Pt unsteady at times with RW. C/o DOE and needed rest stops often, approximately every 40 ft. To recliner, VSS. Encouraged x2 more walks and IS. [id number]   Darrick Meigs CES, ACSM 07/23/2021 11:21 AM

## 2021-07-23 NOTE — Progress Notes (Addendum)
Mobility Specialist Progress Note   07/23/21 1620  Mobility  Activity Ambulated in hall  Level of Assistance Contact guard assist, steadying assist  [place] wheel walker  Distance Ambulated (ft) 355 ft  Mobility Ambulated with assistance in hallway  Mobility Response Tolerated well  Mobility performed by Mobility specialist  $Mobility charge 1 Mobility   Received pt in recliner stating having no sx. Agreeable to mobility session. Mod I sit < > stand. x3 standing breaks due to obvious signs of dyspnea and fatigue. After practicing pursed lip breathing techniques irregular breathing quickly resolved. Pt returned back to BR w/ NT notified for pericare and wife in the room.     Pre Mobility: 71 HR, 113/78 BP During Mobility: 97 HR  Post Mobility: 87 HR, 123/ 73 BP, 100% SpO2  Holland Falling Mobility Specialist Phone Number [phone]

## 2021-07-24 ENCOUNTER — Inpatient Hospital Stay (HOSPITAL_COMMUNITY): Payer: Medicare PPO

## 2021-07-24 LAB — DIGOXIN LEVEL: Digoxin Level: 0.4 ng/mL — ABNORMAL LOW (ref 0.8–2.0)

## 2021-07-24 LAB — BASIC METABOLIC PANEL
Anion gap: 12 (ref 5–15)
BUN: 25 mg/dL — ABNORMAL HIGH (ref 8–23)
CO2: 21 mmol/L — ABNORMAL LOW (ref 22–32)
Calcium: 8.7 mg/dL — ABNORMAL LOW (ref 8.9–10.3)
Chloride: 102 mmol/L (ref 98–111)
Creatinine, Ser: 1.45 mg/dL — ABNORMAL HIGH (ref 0.61–1.24)
GFR, Estimated: 50 mL/min — ABNORMAL LOW (ref >60)
Glucose, Bld: 140 mg/dL — ABNORMAL HIGH (ref 70–99)
Potassium: 4.1 mmol/L (ref 3.5–5.1)
Sodium: 135 mmol/L (ref 135–145)

## 2021-07-24 LAB — GLUCOSE, CAPILLARY
Glucose-Capillary: 112 mg/dL — ABNORMAL HIGH (ref 70–99)
Glucose-Capillary: 149 mg/dL — ABNORMAL HIGH (ref 70–99)
Glucose-Capillary: 134 mg/dL — ABNORMAL HIGH (ref 70–99)
Glucose-Capillary: 129 mg/dL — ABNORMAL HIGH (ref 70–99)
Glucose-Capillary: 115 mg/dL — ABNORMAL HIGH (ref 70–99)

## 2021-07-24 MED ORDER — METOPROLOL TARTRATE 50 MG PO TABS
50.0000 mg | ORAL_TABLET | Freq: Two times a day (BID) | ORAL | Status: DC
  Administered 2021-07-24 – 2021-07-25 (×3): 50 mg via ORAL
  Filled 2021-07-24 (×3): qty 1

## 2021-07-24 NOTE — Consult Note (Signed)
   [facility] Inpatient Consult   07/24/2021  PENNY FRISBIE October 15, 1946 [id number]  [place] [facility] [ACO] Patient: [place] Medicare  LLOS: Review for needs  Patient screened for hospitalization length of stay to assess for potential [place] Management service needs for post hospital transition.  Review of patient's medical record reveals patient is a s/p CABG x 5, reviewed Cardiac Rehab notes.   Plan:  Continue to follow progress and disposition to assess for post hospital care management needs.    For questions contact:   Natividad Brood, RN BSN [facility] Liaison  [phone] business mobile phone Toll free office [phone]  Fax number: [phone] [email or web address]

## 2021-07-24 NOTE — Progress Notes (Signed)
Mobility Specialist Progress Note   07/24/21 1713  Mobility  Activity Ambulated in hall  Level of Assistance Modified independent, requires aide device or extra time  Assistive Device Front wheel walker  Distance Ambulated (ft) 470 ft  Mobility Ambulated with assistance in hallway  Mobility Response Tolerated well  Mobility performed by Mobility specialist  $Mobility charge 1 Mobility   Received pt sitting in recliner. Pt asx and agreeable to mobility session. Mod I sit < > stand, x2 standing breaks d/t fatigue. Pt returned back EOB with call bell by side and family members in room.   Pre Mobility: 106 HR, 93/78 BP During Mobility: 126 HR Post Mobility: 98 HR, 113/77 BP  Holland Falling Mobility Specialist Phone Number [phone]

## 2021-07-24 NOTE — Progress Notes (Signed)
Discussed with pt, wife, and daughter IS, sternal precautions, exercise, diet, and CRPII. Also discussed afib and gave [employer] book. Answered many questions. Will refer to [place].  [place], ACSM 3:21 PM 07/24/2021

## 2021-07-24 NOTE — Progress Notes (Addendum)
[address]       [place] [address]             [phone]      8 Days Post-Op Procedure(s) (LRB): CORONARY ARTERY BYPASS GRAFTING (CABG) X FIVE, USING LEFT INTERNAL MAMMARY ARTERY AND RIGHT LEG GREATER SAPHENOUS VEIN HARVESTED ENDOSCOPICALLY (N/A) TRANSESOPHAGEAL ECHOCARDIOGRAM (TEE) (N/A) APPLICATION OF CELL SAVER ENDOVEIN HARVEST OF GREATER SAPHENOUS VEIN Subjective: Some right ankle discomfort  Objective: Vital signs in last 24 hours: Temp:  [97.9 F (36.6 C)-99.5 F (37.5 C)] 98.1 F (36.7 C) (09/20 0358) Pulse Rate:  [73-110] 85 (09/20 0358) Cardiac Rhythm: Normal sinus rhythm (09/20 0254) Resp:  [16-18] 17 (09/20 0358) BP: (105-146)/(62-88) 105/63 (09/20 0358) SpO2:  [94 %-100 %] 97 % (09/20 0358)  Hemodynamic parameters for last 24 hours:    Intake/Output from previous day: 09/19 0701 - 09/20 0700 In: 360 [P.O.:360] Out: 1200 [Urine:1200] Intake/Output this shift: No intake/output data recorded.  General appearance: alert, cooperative, and no distress Heart: irregularly irregular rhythm Lungs: min dim in bases Abdomen: benign Extremities: + LE edema, mostly ankle/foot, no erethema or tenderness Wound: healing well  Lab Results: No results for input(s): WBC, HGB, HCT, PLT in the last 72 hours. BMET:  Recent Labs    07/23/21 0256 07/24/21 0120  NA 136 135  K 4.2 4.1  CL 103 102  CO2 23 21*  GLUCOSE 110* 140*  BUN 22 25*  CREATININE 1.32* 1.45*  CALCIUM 8.7* 8.7*    PT/INR:  Recent Labs    07/21/21 1101  LABPROT 15.3*  INR 1.2   ABG    Component Value Date/Time   PHART 7.360 07/16/2021 2103   HCO3 20.7 07/16/2021 2103   TCO2 22 07/16/2021 2103   ACIDBASEDEF 4.0 (H) 07/16/2021 2103   O2SAT 98.0 07/16/2021 2103   CBG (last 3)  Recent Labs    07/23/21 1558 07/23/21 2053 07/24/21 0612  GLUCAP 137* 136* 112*    Meds Scheduled Meds:  amiodarone  400 mg Oral BID   apixaban  5 mg Oral BID   aspirin EC  81  mg Oral Daily   atorvastatin  80 mg Oral Daily   colchicine  0.6 mg Oral Q breakfast   digoxin  0.125 mg Oral Daily   furosemide  40 mg Oral Daily   insulin aspart  0-15 Units Subcutaneous TID WC   insulin aspart  0-5 Units Subcutaneous QHS   insulin aspart  5 Units Subcutaneous TID WC   magnesium oxide  200 mg Oral Daily   metFORMIN  1,000 mg Oral Q supper   metFORMIN  500 mg Oral Q breakfast   metoprolol tartrate  37.5 mg Oral BID   pantoprazole  40 mg Oral Daily   potassium chloride  20 mEq Oral Daily   sodium chloride flush  3 mL Intravenous Q12H   Thrombi-Pad  1 each Topical Once   Continuous Infusions:  sodium chloride     PRN Meds:.sodium chloride, alum & mag hydroxide-simeth, loperamide HCl, metoprolol tartrate, ondansetron (ZOFRAN) IV, oxyCODONE, sodium chloride flush, zolpidem  Xrays No results found.  Assessment/Plan: S/P Procedure(s) (LRB): CORONARY ARTERY BYPASS GRAFTING (CABG) X FIVE, USING LEFT INTERNAL MAMMARY ARTERY AND RIGHT LEG GREATER SAPHENOUS VEIN HARVESTED ENDOSCOPICALLY (N/A) TRANSESOPHAGEAL ECHOCARDIOGRAM (TEE) (N/A) APPLICATION OF CELL SAVER ENDOVEIN HARVEST OF GREATER SAPHENOUS VEIN  1 afeb, VSS, except tachy at times upto 140's-mostly sinus with PAC's,  will up titrate beta blocker 2 sats  good on RA 3 good UOP ,  on lasix, creat did bump a little to 1.45 from 1.32- will repeat in am, may need to ad 4 BS adeq control 5 ankle discomfort not similat to previous gout - monitor clinically 6 rehab/pulm toilet- routine- ambulation is improving   LOS: 12 days    John Giovanni PA-C Pager [phone] 07/24/2021  Patient seen and examined. Agree with above Possibly home tomorrow if creatinine stable  Remo Lipps C. Roxan Hockey, MD [facility] [phone]

## 2021-07-24 NOTE — Progress Notes (Addendum)
CARDIAC REHAB PHASE I   PRE:  Rate/Rhythm: 80 afib with occasional sinus beat    BP: sitting 121/75    SaO2: 94 RA  MODE:  Ambulation: 470 ft   POST:  Rate/Rhythm: 141 afib    BP: sitting 134/97     SaO2: 99 RA  Pt moved out of bed with min assist, stood with min assist. To BR with min assist. Ambulated with RW, in consistent afib with activity, HR up to 140 while walking. Pt more SOB initially with frequent rest. Then able to walk longer distance. Increased distance today. To recliner. Tolerating afib better. Pt needs a RW for home. [place], ACSM 07/24/2021 11:24 AM

## 2021-07-24 NOTE — Care Management Important Message (Signed)
Important Message  Patient Details  Name: Oscar [name] MRN: [id number] Date of Birth: Apr 07, 1946   Medicare Important Message Given:  Yes     [name] 07/24/2021, 10:56 AM

## 2021-07-25 LAB — BASIC METABOLIC PANEL
Anion gap: 10 (ref 5–15)
BUN: 25 mg/dL — ABNORMAL HIGH (ref 8–23)
CO2: 23 mmol/L (ref 22–32)
Calcium: 8.6 mg/dL — ABNORMAL LOW (ref 8.9–10.3)
Chloride: 102 mmol/L (ref 98–111)
Creatinine, Ser: 1.44 mg/dL — ABNORMAL HIGH (ref 0.61–1.24)
GFR, Estimated: 51 mL/min — ABNORMAL LOW (ref >60)
Glucose, Bld: 102 mg/dL — ABNORMAL HIGH (ref 70–99)
Potassium: 4.3 mmol/L (ref 3.5–5.1)
Sodium: 135 mmol/L (ref 135–145)

## 2021-07-25 LAB — GLUCOSE, CAPILLARY: Glucose-Capillary: 88 mg/dL (ref 70–99)

## 2021-07-25 MED ORDER — AMIODARONE HCL 400 MG PO TABS: 400.0000 mg | ORAL_TABLET | Freq: Two times a day (BID) | ORAL | 1 refills | Status: DC

## 2021-07-25 MED ORDER — APIXABAN 5 MG PO TABS: 5.0000 mg | ORAL_TABLET | Freq: Two times a day (BID) | ORAL | 1 refills | Status: DC

## 2021-07-25 MED ORDER — METOPROLOL TARTRATE 50 MG PO TABS: 50.0000 mg | ORAL_TABLET | Freq: Two times a day (BID) | ORAL | 1 refills | Status: DC

## 2021-07-25 MED ORDER — DIGOXIN 125 MCG PO TABS: 0.1250 mg | ORAL_TABLET | Freq: Every day | ORAL | 1 refills | Status: DC

## 2021-07-25 MED ORDER — ATORVASTATIN CALCIUM 80 MG PO TABS: 80.0000 mg | ORAL_TABLET | Freq: Every day | ORAL | 1 refills | Status: DC

## 2021-07-25 MED ORDER — OXYCODONE HCL 5 MG PO TABS: 5.0000 mg | ORAL_TABLET | Freq: Four times a day (QID) | ORAL | 0 refills | Status: DC | PRN

## 2021-07-25 MED ORDER — FUROSEMIDE 20 MG PO TABS: 20.0000 mg | ORAL_TABLET | Freq: Every day | ORAL | 1 refills | Status: DC | PRN

## 2021-07-25 NOTE — Progress Notes (Addendum)
[address]       [place] [address]             [phone]      9 Days Post-Op Procedure(s) (LRB): CORONARY ARTERY BYPASS GRAFTING (CABG) X FIVE, USING LEFT INTERNAL MAMMARY ARTERY AND RIGHT LEG GREATER SAPHENOUS VEIN HARVESTED ENDOSCOPICALLY (N/A) TRANSESOPHAGEAL ECHOCARDIOGRAM (TEE) (N/A) APPLICATION OF CELL SAVER ENDOVEIN HARVEST OF GREATER SAPHENOUS VEIN Subjective: Feels well  Objective: Vital signs in last 24 hours: Temp:  [98.4 F (36.9 C)-99.8 F (37.7 C)] 98.4 F (36.9 C) (09/21 0342) Pulse Rate:  [69-105] 96 (09/21 0342) Cardiac Rhythm: Normal sinus rhythm (09/20 2049) Resp:  [18-20] 18 (09/21 0342) BP: (101-126)/(61-84) 101/67 (09/21 0342) SpO2:  [94 %-100 %] 95 % (09/21 0342) Weight:  [120.4 kg] 120.4 kg (09/21 3825)  Hemodynamic parameters for last 24 hours:    Intake/Output from previous day: 09/20 0701 - 09/21 0700 In: 240 [P.O.:240] Out: 500 [Urine:500] Intake/Output this shift: No intake/output data recorded.  General appearance: alert, cooperative, and no distress Heart: regular rate and rhythm Lungs: clear to auscultation bilaterally Abdomen: benign Extremities: + LE edema Wound: incis healing well  Lab Results: No results for input(s): WBC, HGB, HCT, PLT in the last 72 hours. BMET:  Recent Labs    07/24/21 0120 07/25/21 0140  NA 135 135  K 4.1 4.3  CL 102 102  CO2 21* 23  GLUCOSE 140* 102*  BUN 25* 25*  CREATININE 1.45* 1.44*  CALCIUM 8.7* 8.6*    PT/INR: No results for input(s): LABPROT, INR in the last 72 hours. ABG    Component Value Date/Time   PHART 7.360 07/16/2021 2103   HCO3 20.7 07/16/2021 2103   TCO2 22 07/16/2021 2103   ACIDBASEDEF 4.0 (H) 07/16/2021 2103   O2SAT 98.0 07/16/2021 2103   CBG (last 3)  Recent Labs    07/24/21 1801 07/24/21 2155 07/25/21 0622  GLUCAP 129* 115* 88    Meds Scheduled Meds:  amiodarone  400 mg Oral BID   apixaban  5 mg Oral BID   aspirin EC  81 mg Oral  Daily   atorvastatin  80 mg Oral Daily   colchicine  0.6 mg Oral Q breakfast   digoxin  0.125 mg Oral Daily   furosemide  40 mg Oral Daily   insulin aspart  0-15 Units Subcutaneous TID WC   insulin aspart  0-5 Units Subcutaneous QHS   insulin aspart  5 Units Subcutaneous TID WC   magnesium oxide  200 mg Oral Daily   metFORMIN  1,000 mg Oral Q supper   metFORMIN  500 mg Oral Q breakfast   metoprolol tartrate  50 mg Oral BID   pantoprazole  40 mg Oral Daily   potassium chloride  20 mEq Oral Daily   sodium chloride flush  3 mL Intravenous Q12H   Thrombi-Pad  1 each Topical Once   Continuous Infusions:  sodium chloride     PRN Meds:.sodium chloride, alum & mag hydroxide-simeth, loperamide HCl, metoprolol tartrate, ondansetron (ZOFRAN) IV, oxyCODONE, sodium chloride flush, zolpidem  Xrays DG Chest 2 View  Result Date: 07/24/2021 CLINICAL DATA:  Recent open cardiac surgery, sore chest, atelectasis EXAM: CHEST - 2 VIEW COMPARISON:  07/21/2021 chest radiograph. FINDINGS: Intact sternotomy wires. Stable cardiomediastinal silhouette with mild cardiomegaly. No pneumothorax. Small left pleural effusion, stable. No right pleural effusion. Low lung volumes and moderate left lower lobe atelectasis, similar. No overt pulmonary edema. IMPRESSION: 1. Stable small  left pleural effusion and moderate left lower lobe atelectasis. 2. Stable mild cardiomegaly without overt pulmonary edema. Electronically Signed   By: Ilona Sorrel M.D.   On: 07/24/2021 10:05    Assessment/Plan: S/P Procedure(s) (LRB): CORONARY ARTERY BYPASS GRAFTING (CABG) X FIVE, USING LEFT INTERNAL MAMMARY ARTERY AND RIGHT LEG GREATER SAPHENOUS VEIN HARVESTED ENDOSCOPICALLY (N/A) TRANSESOPHAGEAL ECHOCARDIOGRAM (TEE) (N/A) APPLICATION OF CELL SAVER ENDOVEIN HARVEST OF GREATER SAPHENOUS VEIN  1 afeb, VSS, HR mostly well controlled, some afib rate into 140's early yesterday, mostly sinus with some PAC's 2 sats good on RA 3 creat stable at  1.44 4 BS adeq controlled 5 prob home later today, will d/w MD     LOS: 13 days    John Giovanni PA-C Pager [phone] 07/25/2021   Patient seen and examined, agree with above Will arrange [facility] for restorative care  Remo Lipps C. Roxan Hockey, MD [facility] [phone]

## 2021-07-25 NOTE — TOC Transition Note (Addendum)
Transition of Care [facility]) - CM/SW Discharge Note   Patient Details  Name: Oscar [name] MRN: [id number] Date of Birth: May 15, 1946  [facility] CM/SW Contact:  Cyndi Bender, RN Phone Number: 07/25/2021, 11:47 AM   Clinical Narrative:    Patient stable for discharge. Orders for DME and home health. Spoke to patient and wife regarding transition needs. Choice offered with list provide.Per CMS guidelines from medicare.gov website with star ratings (copy placed in shadow chart) Patient and wife selected [facility] as 1st choice and [facility] for home health needs. Agreeable to use in house provider for DME. Address, phone number and PCP confirmed.   Called made to [facility] for DME needs. Rolling walker to be delivered to room prior to discharge.  Called [facility] unable to reach. Call made to [facility] and spoke to [facility] for home health referral excepted.  Eliquis card given to patient to take to pharmacy.   Final next level of care: [place] Barriers to Discharge: Barriers Resolved   Patient Goals and CMS Choice Patient states their goals for this hospitalization and ongoing recovery are:: return home CMS Medicare.gov Compare Post Acute Care list provided to:: Patient Choice offered to / list presented to : Spouse  Discharge Placement             Home with home health          Discharge Plan and Services In-house Referral: NA Discharge Planning Services: CM Consult Post Acute Care Choice: Home Health, Durable Medical Equipment          DME Arranged: Walker rolling DME Agency: [facility], [facility] Date DME Agency Contacted: 07/25/21 Time DME Agency Contacted: 1000 Representative spoke with at DME Agency: [place]: RN [place] Agency: [place] Date [place]: 07/25/21 Time [place]: 1146 Representative spoke with at [place]: [place] ([place]) Interventions      Readmission Risk Interventions No flowsheet data found.

## 2021-07-25 NOTE — Progress Notes (Signed)
D/C instructions given to pt. Medications and wound care reviewed. All questions answered. IV removed, clean and intact. Wife to escort pt home with walker.  Clyde Canterbury, RN

## 2021-07-25 NOTE — Progress Notes (Signed)
Pt without questions, feeling well. In NSR right now.  [id number] [place], ACSM 11:35 AM 07/25/2021

## 2021-07-25 NOTE — Progress Notes (Signed)
Mobility Specialist Progress Note    07/25/21 1220  Mobility  Activity Refused mobility   Pt refused d/t d/c departure.  Holland Falling Mobility Specialist Phone Number [phone]

## 2021-07-27 DIAGNOSIS — Z09 Encounter for follow-up examination after completed treatment for conditions other than malignant neoplasm: Secondary | ICD-10-CM | POA: Diagnosis not present

## 2021-07-27 DIAGNOSIS — Z951 Presence of aortocoronary bypass graft: Secondary | ICD-10-CM | POA: Diagnosis not present

## 2021-07-27 DIAGNOSIS — D6869 Other thrombophilia: Secondary | ICD-10-CM | POA: Diagnosis not present

## 2021-07-27 DIAGNOSIS — I4891 Unspecified atrial fibrillation: Secondary | ICD-10-CM | POA: Diagnosis not present

## 2021-07-29 DIAGNOSIS — I119 Hypertensive heart disease without heart failure: Secondary | ICD-10-CM | POA: Diagnosis not present

## 2021-07-29 DIAGNOSIS — G8929 Other chronic pain: Secondary | ICD-10-CM | POA: Diagnosis not present

## 2021-07-29 DIAGNOSIS — I491 Atrial premature depolarization: Secondary | ICD-10-CM | POA: Diagnosis not present

## 2021-07-29 DIAGNOSIS — I25110 Atherosclerotic heart disease of native coronary artery with unstable angina pectoris: Secondary | ICD-10-CM | POA: Diagnosis not present

## 2021-07-29 DIAGNOSIS — I493 Ventricular premature depolarization: Secondary | ICD-10-CM | POA: Diagnosis not present

## 2021-07-29 DIAGNOSIS — Z48812 Encounter for surgical aftercare following surgery on the circulatory system: Secondary | ICD-10-CM | POA: Diagnosis not present

## 2021-07-29 DIAGNOSIS — M109 Gout, unspecified: Secondary | ICD-10-CM | POA: Diagnosis not present

## 2021-07-29 DIAGNOSIS — E119 Type 2 diabetes mellitus without complications: Secondary | ICD-10-CM | POA: Diagnosis not present

## 2021-07-29 DIAGNOSIS — I441 Atrioventricular block, second degree: Secondary | ICD-10-CM | POA: Diagnosis not present

## 2021-07-30 ENCOUNTER — Telehealth: Payer: Self-pay | Admitting: Internal Medicine

## 2021-07-30 ENCOUNTER — Telehealth: Payer: Self-pay

## 2021-07-30 NOTE — Telephone Encounter (Signed)
Pt called to report that they spoke with the surgeons office over the weekend for HR 40 and he was told to hold his metoprolol when his HR is that low.. I reassured them that the surgeons office was correct... he has not taken today and his BP now is 118/70 HR 55.   He has had some ankle edema but no SOB... he has been taking the Lasix daily as opposed to PRN... I have advised him to only use it as needed, to elevate his feet when sitting, to hold off on compression stockings that have not been recommended yet by a provider until he is seen and his wound is reassessed.   He will watch his NA intake but to be sure that he is still getting some hydration.   He says he is feeling well just tired.. he will continue to monitor his VS, eat and drinking well, and keep his appt on 08/01/21 with Roderic Palau NP.

## 2021-07-30 NOTE — Telephone Encounter (Signed)
Pt c/o medication issue:  1. Name of Medication: metoprolol tartrate (LOPRESSOR) 50 MG tablet  2. How are you currently taking this medication (dosage and times per day)? As directed  3. Are you having a reaction (difficulty breathing--STAT)? no  4. What is your medication issue? Per pt's wife, Dr. Kipp Brood from [facility] advised pt to stop taking this medicine if his pulse rate stayed about 40 on Sunday 07/29/21

## 2021-07-30 NOTE — Telephone Encounter (Signed)
[name] and daughter, Oscar [name] contacted the office with concerns about parameters if [name] should continue or stop Metoprolol. He stated that he called the on call physician over the weekend and was advised to stop Metoprolol over the weekend but they were unsure if they should restart it back.  [name] is s/p CABG 07/16/21 with Dr. Roxan Hockey and since being home he has been very tired, headaches at times and his heart rate would be in the 40's.  He stated that he did not take the medication on Sunday and it was "a much better day". While on the phone [name] stated that his heart rate was 68, then it went to 42. States that he does have Afib and he is consistently taking Amiodarone. He occasionally describes chest tightness and his heart beating out of his chest.   Advised [name] per PA, Enid Cutter, PA to stop taking Metoprolol for now. [name] does have a follow-up with Cardiology this Wednesday 08/01/21 and advised to let Cardiology know that he has stopped Metoprolol. He acknowledged receipt.

## 2021-07-31 ENCOUNTER — Other Ambulatory Visit (HOSPITAL_COMMUNITY): Payer: Self-pay | Admitting: *Deleted

## 2021-07-31 DIAGNOSIS — M109 Gout, unspecified: Secondary | ICD-10-CM | POA: Diagnosis not present

## 2021-07-31 DIAGNOSIS — I493 Ventricular premature depolarization: Secondary | ICD-10-CM | POA: Diagnosis not present

## 2021-07-31 DIAGNOSIS — G8929 Other chronic pain: Secondary | ICD-10-CM | POA: Diagnosis not present

## 2021-07-31 DIAGNOSIS — I441 Atrioventricular block, second degree: Secondary | ICD-10-CM | POA: Diagnosis not present

## 2021-07-31 DIAGNOSIS — I25110 Atherosclerotic heart disease of native coronary artery with unstable angina pectoris: Secondary | ICD-10-CM | POA: Diagnosis not present

## 2021-07-31 DIAGNOSIS — E119 Type 2 diabetes mellitus without complications: Secondary | ICD-10-CM | POA: Diagnosis not present

## 2021-07-31 DIAGNOSIS — Z951 Presence of aortocoronary bypass graft: Secondary | ICD-10-CM

## 2021-07-31 DIAGNOSIS — Z48812 Encounter for surgical aftercare following surgery on the circulatory system: Secondary | ICD-10-CM | POA: Diagnosis not present

## 2021-07-31 DIAGNOSIS — I491 Atrial premature depolarization: Secondary | ICD-10-CM | POA: Diagnosis not present

## 2021-07-31 DIAGNOSIS — I119 Hypertensive heart disease without heart failure: Secondary | ICD-10-CM | POA: Diagnosis not present

## 2021-08-01 ENCOUNTER — Other Ambulatory Visit: Payer: Self-pay

## 2021-08-01 ENCOUNTER — Ambulatory Visit (HOSPITAL_COMMUNITY)
Admission: RE | Admit: 2021-08-01 | Discharge: 2021-08-01 | Disposition: A | Discharge status: Home / Self Care | Payer: Medicare PPO | Source: Ambulatory Visit | Attending: Nurse Practitioner | Admitting: Nurse Practitioner

## 2021-08-01 VITALS — BP 128/60 | HR 97 | Ht 72.0 in | Wt 262.6 lb

## 2021-08-01 DIAGNOSIS — Z951 Presence of aortocoronary bypass graft: Secondary | ICD-10-CM | POA: Diagnosis not present

## 2021-08-01 DIAGNOSIS — Z79899 Other long term (current) drug therapy: Secondary | ICD-10-CM | POA: Insufficient documentation

## 2021-08-01 DIAGNOSIS — D6869 Other thrombophilia: Secondary | ICD-10-CM | POA: Diagnosis not present

## 2021-08-01 DIAGNOSIS — I10 Essential (primary) hypertension: Secondary | ICD-10-CM | POA: Insufficient documentation

## 2021-08-01 DIAGNOSIS — Z7901 Long term (current) use of anticoagulants: Secondary | ICD-10-CM | POA: Diagnosis not present

## 2021-08-01 DIAGNOSIS — Z8249 Family history of ischemic heart disease and other diseases of the circulatory system: Secondary | ICD-10-CM | POA: Insufficient documentation

## 2021-08-01 DIAGNOSIS — I4891 Unspecified atrial fibrillation: Secondary | ICD-10-CM

## 2021-08-01 DIAGNOSIS — I2510 Atherosclerotic heart disease of native coronary artery without angina pectoris: Secondary | ICD-10-CM | POA: Diagnosis not present

## 2021-08-01 DIAGNOSIS — Z87891 Personal history of nicotine dependence: Secondary | ICD-10-CM | POA: Insufficient documentation

## 2021-08-01 MED ORDER — METOPROLOL SUCCINATE ER 25 MG PO TB24: 12.5000 mg | ORAL_TABLET | Freq: Every day | ORAL | 3 refills | Status: DC

## 2021-08-01 NOTE — Patient Instructions (Signed)
Start metoprolol succinate 12.5mg  at bedtime

## 2021-08-01 NOTE — Progress Notes (Signed)
Primary Care Physician: Sueanne Margarita, DO Referring Physician: Dr. [name] is a 75 y.o. male with CAD/s/p bypass 9/8 - 9/21 in afib clinic for f/u onset afib post op bypass. He was started on IV amiodarone and transitioned to po and discharged on  400 mg bid x one week and he  is now on 200 mg daily. He is in SR with frequent PAC's. The family called over the weekend as his heart rates were in the 40's and advised to stop metoprolol tartrate 50 mg bid. Wife reports that they were trying to count the pulse at the wrist.   Today, ekg shows sinus rhythm at 97 bpm, with frequent PAC's. I question if the heart rates were truly in the 40's or the pac's were not picked up counting at the wrist. I believe he may be better off to start back on 12.5 of metoprolol succinate at hs to help discourage the frequents PAC's/afib. They have since bought a pulse ox monitor and will check his pulse with this going forward.   He feels he is improving slowly from surgery. Leg and chest incisions healing well. He has mild pedal edema. He is sleeping in a recliner  Today, he denies symptoms of palpitations, chest pain, shortness of breath, orthopnea, PND, +mild lower extremity edema, dizziness, presyncope, syncope, or neurologic sequela. The patient is tolerating medications without difficulties and is otherwise without complaint today.   Past Medical History:  Diagnosis Date   Anesthesia complication    PER PT, "HARD TO WAKE' UP PAST SHOULDER SURGERY IN 2016   Arthritis    lumbar, spondylosis, DDD,stenosis    Atrial premature depolarization    Chronic right hip pain    and right leg pain   Coronary artery disease    Diabetes mellitus without complication ([place])    Dysrhythmia    pt. reports that he has been told in the past that he has  irreg. heattbeat    Edema    Gout    bil ankles   Hyperlipidemia    Hypertension    Rotator cuff tear    right   Shortness of breath    Swelling of  extremity    bil feet   Type 2 diabetes mellitus with hyperglycemia [facility])    Past Surgical History:  Procedure Laterality Date   BACK SURGERY     January 2014   CARDIAC CATHETERIZATION     2004, outcome good, to be followed for/with medical therapy    CORONARY ARTERY BYPASS GRAFT N/A 07/16/2021   Procedure: CORONARY ARTERY BYPASS GRAFTING (CABG) X FIVE, USING LEFT INTERNAL MAMMARY ARTERY AND RIGHT LEG GREATER SAPHENOUS VEIN HARVESTED ENDOSCOPICALLY;  Surgeon: Melrose Nakayama, MD;  Location: [place];  Service: Open Heart Surgery;  Laterality: N/A;   ELBOW BURSA SURGERY     removed bursacs both elbows   ENDOVEIN HARVEST OF GREATER SAPHENOUS VEIN  07/16/2021   Procedure: ENDOVEIN HARVEST OF GREATER SAPHENOUS VEIN;  Surgeon: Melrose Nakayama, MD;  Location: [facility] OR;  Service: Open Heart Surgery;;   FASCIECTOMY Left 12/12/2020   Procedure: FASCIECTOMY LEFT SMALL FINGER;  Surgeon: Daryll Brod, MD;  Location: [place];  Service: Orthopedics;  Laterality: Left;   LEFT HEART CATH AND CORONARY ANGIOGRAPHY N/A 07/12/2021   Procedure: LEFT HEART CATH AND CORONARY ANGIOGRAPHY;  Surgeon: Burnell Blanks, MD;  Location: [place] [facility];  Service: Cardiovascular;  Laterality: N/A;   SHOULDER ARTHROSCOPY  WITH ROTATOR CUFF REPAIR AND SUBACROMIAL DECOMPRESSION Right 08/21/2015   Procedure: SHOULDER ARTHROSCOPY WITH ROTATOR CUFF REPAIR S UBACROMIAL DECOMPRESSION, ;  Surgeon: Tania Ade, MD;  Location: [place];  Service: Orthopedics;  Laterality: Right;  Right shoulder arthroscopy rotator cuff repair vs debridement, subacromial decompression, possible biceps tenotomy.   TEE WITHOUT CARDIOVERSION N/A 07/16/2021   Procedure: TRANSESOPHAGEAL ECHOCARDIOGRAM (TEE);  Surgeon: Melrose Nakayama, MD;  Location: [place];  Service: Open Heart Surgery;  Laterality: N/A;    Current Outpatient Medications  Medication Sig Dispense Refill   amiodarone (PACERONE) 400 MG  tablet Take 1 tablet (400 mg total) by mouth 2 (two) times daily. For 7 days then once daily 37 tablet 1   apixaban (ELIQUIS) 5 MG TABS tablet Take 1 tablet (5 mg total) by mouth 2 (two) times daily. 60 tablet 1   aspirin 81 MG tablet Take 81 mg by mouth daily.     atorvastatin (LIPITOR) 80 MG tablet Take 1 tablet (80 mg total) by mouth daily. 60 tablet 1   digoxin (LANOXIN) 0.125 MG tablet Take 1 tablet (0.125 mg total) by mouth daily. 30 tablet 1   furosemide (LASIX) 20 MG tablet Take 1 tablet (20 mg total) by mouth daily as needed for edema. 30 tablet 1   halobetasol (ULTRAVATE) 0.05 % cream Apply 1 application topically 2 (two) times daily as needed (psoriasis).     hydrocortisone 2.5 % cream Apply topically 2 (two) times daily. (Patient taking differently: Apply 1 application topically 2 (two) times daily as needed (psoriasis).) 30 g 4   Magnesium 250 MG TABS Take 500 mg by mouth daily.     metFORMIN (GLUCOPHAGE) 500 MG tablet Take 500-1,000 mg by mouth See admin instructions. 500 mg in the morning, 1000 mg in the evening     metoprolol succinate (TOPROL XL) 25 MG 24 hr tablet Take 0.5 tablets (12.5 mg total) by mouth at bedtime. 30 tablet 3   omeprazole (PRILOSEC OTC) 20 MG tablet Take 20 mg by mouth daily.     oxyCODONE (OXY IR/ROXICODONE) 5 MG immediate release tablet Take 1 tablet (5 mg total) by mouth every 6 (six) hours as needed for severe pain. 28 tablet 0   vitamin B-12 (CYANOCOBALAMIN) 500 MCG tablet Take 500 mcg by mouth daily.     No current facility-administered medications for this encounter.   Facility-Administered Medications Ordered in Other Encounters  Medication Dose Route Frequency Provider Last Rate Last Admin   technetium tetrofosmin (TC-MYOVIEW) injection 74.1 millicurie  28.7 millicurie Intravenous Once PRN Crenshaw, Denice Bors, MD        Allergies  Allergen Reactions   Yellow Fever Vaccine Hives    Social History   Socioeconomic History   Marital status:  Married    Spouse name: Not on file   Number of children: Not on file   Years of education: Not on file   Highest education level: Not on file  Occupational History   Not on file  Tobacco Use   Smoking status: Former    Packs/day: 0.30    Years: 10.00    Pack years: 3.00    Types: Cigarettes    Quit date: 11/04/1973    Years since quitting: 47.7   Smokeless tobacco: Never  Vaping Use   Vaping Use: Never used  Substance and Sexual Activity   Alcohol use: Not Currently    Comment: rare. wine or beer   Drug use: No   Sexual activity: Not  on file  Other Topics Concern   Not on file  Social History Narrative   Not on file   Social Determinants of Health   Financial Resource Strain: Not on file  Food Insecurity: Not on file  Transportation Needs: Not on file  Physical Activity: Not on file  Stress: Not on file  Social Connections: Not on file  Intimate Partner Violence: Not on file    Family History  Problem Relation Age of Onset   Heart disease Father    Diabetes Father    Heart disease Brother    Diabetes Paternal Grandmother    Colon cancer Neg Hx    Esophageal cancer Neg Hx    Rectal cancer Neg Hx    Stomach cancer Neg Hx     ROS- All systems are reviewed and negative except as per the HPI above  Physical Exam: Vitals:   08/01/21 0956  BP: 128/60  Pulse: 97  Weight: 119.1 kg  Height: 6' (1.829 m)   Wt Readings from Last 3 Encounters:  08/01/21 119.1 kg  07/25/21 120.4 kg  07/05/21 119.5 kg    Labs: Lab Results  Component Value Date   NA 135 07/25/2021   K 4.3 07/25/2021   CL 102 07/25/2021   CO2 23 07/25/2021   GLUCOSE 102 (H) 07/25/2021   BUN 25 (H) 07/25/2021   CREATININE 1.44 (H) 07/25/2021   CALCIUM 8.6 (L) 07/25/2021   MG 1.8 07/21/2021   Lab Results  Component Value Date   INR 1.2 07/21/2021   No results found for: CHOL, HDL, LDLCALC, TRIG   GEN- The patient is well appearing, alert and oriented x 3 today.   Head-  normocephalic, atraumatic Eyes-  Sclera clear, conjunctiva pink Ears- hearing intact Oropharynx- clear Neck- supple, no JVP Lymph- no cervical lymphadenopathy Lungs- Clear to ausculation bilaterally, normal work of breathing Heart- Regular rate and rhythm, no murmurs, rubs or gallops, PMI not laterally displaced, sternotomy incision haling well  GI- soft, NT, ND, + BS Extremities- no clubbing, cyanosis, or mild  edema of rt foot  MS- no significant deformity or atrophy Skin- no rash or lesion Psych- euthymic mood, full affect Neuro- strength and sensation are intact  EKG-sinus rhythm with frequent PAC's at 97 bpm, pr int 190 ms, qrs int 104 ms, qtc 40- ms   Epic records reviewed   1. New onset afib  In setting of post op CABG x 5  Continue amiodarone 200 mg daily I will add back in metoprolol succinate 25 mg 1/2 tab at hs to smooth over some of the PAC's/maintain SR  Assess  pulse going forward with Pulse OX as with frequent PAC's, checking with  blood pressure cuff or counting at wrist may make pulse look abnormally low   2. HTN Stable   3.[id number]  score of at least 4 Continue eliquis 5 mg bid    F/u with Cardiac surgeon 10/11 and Dr. Debara Pickett 10/11  Geroge Baseman. Jamal Pavon, [facility] [address] [place], [place] [address] [phone]

## 2021-08-02 ENCOUNTER — Telehealth (HOSPITAL_COMMUNITY): Payer: Self-pay

## 2021-08-02 DIAGNOSIS — G8929 Other chronic pain: Secondary | ICD-10-CM | POA: Diagnosis not present

## 2021-08-02 DIAGNOSIS — I119 Hypertensive heart disease without heart failure: Secondary | ICD-10-CM | POA: Diagnosis not present

## 2021-08-02 DIAGNOSIS — I441 Atrioventricular block, second degree: Secondary | ICD-10-CM | POA: Diagnosis not present

## 2021-08-02 DIAGNOSIS — I491 Atrial premature depolarization: Secondary | ICD-10-CM | POA: Diagnosis not present

## 2021-08-02 DIAGNOSIS — E119 Type 2 diabetes mellitus without complications: Secondary | ICD-10-CM | POA: Diagnosis not present

## 2021-08-02 DIAGNOSIS — I493 Ventricular premature depolarization: Secondary | ICD-10-CM | POA: Diagnosis not present

## 2021-08-02 DIAGNOSIS — I25110 Atherosclerotic heart disease of native coronary artery with unstable angina pectoris: Secondary | ICD-10-CM | POA: Diagnosis not present

## 2021-08-02 DIAGNOSIS — M109 Gout, unspecified: Secondary | ICD-10-CM | POA: Diagnosis not present

## 2021-08-02 DIAGNOSIS — Z48812 Encounter for surgical aftercare following surgery on the circulatory system: Secondary | ICD-10-CM | POA: Diagnosis not present

## 2021-08-02 NOTE — Telephone Encounter (Signed)
Pt insurance is active and benefits verified through [employer] $20, DED 0/0 met, out of pocket $3,300/$719 met, co-insurance 0%. no pre-authorization required. Passport, 08/02/2021@9 :43am, REF# [id number]   Will contact patient to see if he is interested in the Cardiac Rehab Program. If interested, patient will need to complete follow up appt. Once completed, patient will be contacted for scheduling upon review by the RN Navigator.

## 2021-08-02 NOTE — Telephone Encounter (Signed)
Called patient to see if he is interested in the Cardiac Rehab Program. Patient expressed interest. Explained scheduling process and went over insurance, patient verbalized understanding. Will contact patient for scheduling once f/u has been completed.  °

## 2021-08-07 DIAGNOSIS — I493 Ventricular premature depolarization: Secondary | ICD-10-CM | POA: Diagnosis not present

## 2021-08-07 DIAGNOSIS — G8929 Other chronic pain: Secondary | ICD-10-CM | POA: Diagnosis not present

## 2021-08-07 DIAGNOSIS — Z48812 Encounter for surgical aftercare following surgery on the circulatory system: Secondary | ICD-10-CM | POA: Diagnosis not present

## 2021-08-07 DIAGNOSIS — I441 Atrioventricular block, second degree: Secondary | ICD-10-CM | POA: Diagnosis not present

## 2021-08-07 DIAGNOSIS — I119 Hypertensive heart disease without heart failure: Secondary | ICD-10-CM | POA: Diagnosis not present

## 2021-08-07 DIAGNOSIS — M109 Gout, unspecified: Secondary | ICD-10-CM | POA: Diagnosis not present

## 2021-08-07 DIAGNOSIS — I25110 Atherosclerotic heart disease of native coronary artery with unstable angina pectoris: Secondary | ICD-10-CM | POA: Diagnosis not present

## 2021-08-07 DIAGNOSIS — I491 Atrial premature depolarization: Secondary | ICD-10-CM | POA: Diagnosis not present

## 2021-08-07 DIAGNOSIS — E119 Type 2 diabetes mellitus without complications: Secondary | ICD-10-CM | POA: Diagnosis not present

## 2021-08-10 DIAGNOSIS — M109 Gout, unspecified: Secondary | ICD-10-CM | POA: Diagnosis not present

## 2021-08-10 DIAGNOSIS — Z48812 Encounter for surgical aftercare following surgery on the circulatory system: Secondary | ICD-10-CM | POA: Diagnosis not present

## 2021-08-10 DIAGNOSIS — I25110 Atherosclerotic heart disease of native coronary artery with unstable angina pectoris: Secondary | ICD-10-CM | POA: Diagnosis not present

## 2021-08-10 DIAGNOSIS — E119 Type 2 diabetes mellitus without complications: Secondary | ICD-10-CM | POA: Diagnosis not present

## 2021-08-10 DIAGNOSIS — I119 Hypertensive heart disease without heart failure: Secondary | ICD-10-CM | POA: Diagnosis not present

## 2021-08-10 DIAGNOSIS — I491 Atrial premature depolarization: Secondary | ICD-10-CM | POA: Diagnosis not present

## 2021-08-10 DIAGNOSIS — I441 Atrioventricular block, second degree: Secondary | ICD-10-CM | POA: Diagnosis not present

## 2021-08-10 DIAGNOSIS — I493 Ventricular premature depolarization: Secondary | ICD-10-CM | POA: Diagnosis not present

## 2021-08-10 DIAGNOSIS — G8929 Other chronic pain: Secondary | ICD-10-CM | POA: Diagnosis not present

## 2021-08-13 ENCOUNTER — Ambulatory Visit: Payer: Medicare PPO

## 2021-08-13 ENCOUNTER — Other Ambulatory Visit: Payer: Self-pay | Admitting: *Deleted

## 2021-08-13 DIAGNOSIS — Z951 Presence of aortocoronary bypass graft: Secondary | ICD-10-CM

## 2021-08-14 ENCOUNTER — Ambulatory Visit
Admission: RE | Admit: 2021-08-14 | Discharge: 2021-08-14 | Disposition: A | Discharge status: Home / Self Care | Payer: Medicare PPO | Source: Ambulatory Visit | Attending: Thoracic Surgery (Cardiothoracic Vascular Surgery) | Admitting: Thoracic Surgery (Cardiothoracic Vascular Surgery)

## 2021-08-14 ENCOUNTER — Ambulatory Visit (INDEPENDENT_AMBULATORY_CARE_PROVIDER_SITE_OTHER): Payer: Self-pay | Admitting: Physician Assistant

## 2021-08-14 ENCOUNTER — Other Ambulatory Visit: Payer: Self-pay

## 2021-08-14 VITALS — Ht 72.0 in | Wt 253.0 lb

## 2021-08-14 DIAGNOSIS — Z951 Presence of aortocoronary bypass graft: Secondary | ICD-10-CM | POA: Diagnosis not present

## 2021-08-14 DIAGNOSIS — I517 Cardiomegaly: Secondary | ICD-10-CM | POA: Diagnosis not present

## 2021-08-14 DIAGNOSIS — J90 Pleural effusion, not elsewhere classified: Secondary | ICD-10-CM | POA: Diagnosis not present

## 2021-08-14 MED ORDER — POTASSIUM CHLORIDE CRYS ER 20 MEQ PO TBCR: 20.0000 meq | EXTENDED_RELEASE_TABLET | Freq: Every day | ORAL | 0 refills | Status: DC

## 2021-08-14 NOTE — Progress Notes (Addendum)
[address]       [place] [address]             [phone]     Oscar [name] is a 75 y.o. male patient underwent a CABG x 5 on 9/12. He did develop rapid atrial fibrillation post op and was started on Amio IV protocol.  He required additional Amiodarone boluses. Rapid atrial fibrillation persisted on post-op day 3 and was treated with and additional amiodarone bolus and digoxin loading. Potassium was supplemented as required and the metoprolol was also up-titrated.  He converted to NSR with PACs.  He was transitioned to oral Amiodarone. He was anticoagulated with Eliquis.We did add back his lasix for fluid overload.    Today, he returns to the office for his routine post-op appointment after his hospitalization. Overall he is doing well. His pain is well controlled and he has been doing some walking around the house and outside. No significant SOB or fluttering in his chest.    No diagnosis found. Past Medical History:  Diagnosis Date   Anesthesia complication    PER PT, "HARD TO WAKE' UP PAST SHOULDER SURGERY IN 2016   Arthritis    lumbar, spondylosis, DDD,stenosis    Atrial premature depolarization    Chronic right hip pain    and right leg pain   Coronary artery disease    Diabetes mellitus without complication (HCC)    Dysrhythmia    pt. reports that he has been told in the past that he has  irreg. heattbeat    Edema    Gout    bil ankles   Hyperlipidemia    Hypertension    Rotator cuff tear    right   Shortness of breath    Swelling of extremity    bil feet   Type 2 diabetes mellitus with hyperglycemia ([place])    No past surgical history pertinent negatives on file. Scheduled Meds: Current Outpatient Medications on File Prior to Visit  Medication Sig Dispense Refill   amiodarone (PACERONE) 400 MG tablet Take 1 tablet (400 mg total) by mouth 2 (two) times daily. For 7 days then once daily 37 tablet 1   apixaban (ELIQUIS) 5 MG TABS tablet Take 1  tablet (5 mg total) by mouth 2 (two) times daily. 60 tablet 1   aspirin 81 MG tablet Take 81 mg by mouth daily.     atorvastatin (LIPITOR) 80 MG tablet Take 1 tablet (80 mg total) by mouth daily. 60 tablet 1   digoxin (LANOXIN) 0.125 MG tablet Take 1 tablet (0.125 mg total) by mouth daily. 30 tablet 1   furosemide (LASIX) 20 MG tablet Take 1 tablet (20 mg total) by mouth daily as needed for edema. 30 tablet 1   halobetasol (ULTRAVATE) 0.05 % cream Apply 1 application topically 2 (two) times daily as needed (psoriasis).     hydrocortisone 2.5 % cream Apply topically 2 (two) times daily. (Patient taking differently: Apply 1 application topically 2 (two) times daily as needed (psoriasis).) 30 g 4   Magnesium 250 MG TABS Take 500 mg by mouth daily.     metFORMIN (GLUCOPHAGE) 500 MG tablet Take 500-1,000 mg by mouth See admin instructions. 500 mg in the morning, 1000 mg in the evening     metoprolol succinate (TOPROL XL) 25 MG 24 hr tablet Take 0.5 tablets (12.5 mg total) by mouth at bedtime. 30 tablet 3   omeprazole (PRILOSEC OTC) 20 MG tablet  Take 20 mg by mouth daily.     oxyCODONE (OXY IR/ROXICODONE) 5 MG immediate release tablet Take 1 tablet (5 mg total) by mouth every 6 (six) hours as needed for severe pain. 28 tablet 0   vitamin B-12 (CYANOCOBALAMIN) 500 MCG tablet Take 500 mcg by mouth daily.     Current Facility-Administered Medications on File Prior to Visit  Medication Dose Route Frequency Provider Last Rate Last Admin   technetium tetrofosmin (TC-MYOVIEW) injection 16.1 millicurie  09.6 millicurie Intravenous Once PRN Crenshaw, Denice Bors, MD         Allergies  Allergen Reactions   Yellow Fever Vaccine Hives   Active Problems:   * No active hospital problems. *   Cor: RRR, no murmur Pulm: CTA bilaterally and in all fields Abd: no tenderness, + bowel sounds Ext: 1-2 bilateral pitting edema Wound: clean and dry, no signs of infection.    Assessment & Plan  He overall feels okay  today. He has been staying mostly downstairs for fear of following while climbing the stairs therefore has only been doing "bird baths". Steri strips removed and incision cleaned with betadine.   He may drive from our standpoint since he is off narcotic medication and hasn't taken any for a few weeks He is to shower daily with soap and water. He can move outside he "tube" at this point.  He is singed up for cardiac rehab and on the waiting list He needs to walk 3x daily which he has been doing but can increase  We discussed using his incentive spirometer to assist with residual fluid and atelectasis.  Fluid overload-increase lasix to 40mg  for the next three days with potassium replacement. Then return to 20mg  daily. He will need a BMP ordered by his primary or cardiology. He sees cardiology on Friday. I want to see him back in the office in 1-2 weeks with a repeat CXR.  NSR today but hx of afib. Continue current medication regimen   Plan: f/u in 1-2 weeks with CXR. Please take all prescribed medications. Follow-up with the Afib clinic. We increased his lasix for a few days to assist with fluid overload.   Oscar [name] 08/14/2021

## 2021-08-16 ENCOUNTER — Telehealth: Payer: Self-pay

## 2021-08-16 DIAGNOSIS — I119 Hypertensive heart disease without heart failure: Secondary | ICD-10-CM | POA: Diagnosis not present

## 2021-08-16 DIAGNOSIS — I25110 Atherosclerotic heart disease of native coronary artery with unstable angina pectoris: Secondary | ICD-10-CM | POA: Diagnosis not present

## 2021-08-16 DIAGNOSIS — I491 Atrial premature depolarization: Secondary | ICD-10-CM | POA: Diagnosis not present

## 2021-08-16 DIAGNOSIS — G8929 Other chronic pain: Secondary | ICD-10-CM | POA: Diagnosis not present

## 2021-08-16 DIAGNOSIS — I493 Ventricular premature depolarization: Secondary | ICD-10-CM | POA: Diagnosis not present

## 2021-08-16 DIAGNOSIS — M109 Gout, unspecified: Secondary | ICD-10-CM | POA: Diagnosis not present

## 2021-08-16 DIAGNOSIS — Z48812 Encounter for surgical aftercare following surgery on the circulatory system: Secondary | ICD-10-CM | POA: Diagnosis not present

## 2021-08-16 DIAGNOSIS — E119 Type 2 diabetes mellitus without complications: Secondary | ICD-10-CM | POA: Diagnosis not present

## 2021-08-16 DIAGNOSIS — I441 Atrioventricular block, second degree: Secondary | ICD-10-CM | POA: Diagnosis not present

## 2021-08-16 NOTE — Telephone Encounter (Signed)
Oscar Haring, RN with [facility] contacted the office [phone] to state that she did see patient in the home today and she is to cancel one home health visit this week. Patient has multiple appointment with physicians and will not be able to see home health this week.

## 2021-08-17 ENCOUNTER — Encounter: Payer: Self-pay | Admitting: Internal Medicine

## 2021-08-17 ENCOUNTER — Ambulatory Visit: Payer: Medicare PPO | Admitting: Internal Medicine

## 2021-08-17 ENCOUNTER — Other Ambulatory Visit: Payer: Self-pay

## 2021-08-17 VITALS — BP 112/64 | HR 85 | Ht 72.0 in | Wt 251.2 lb

## 2021-08-17 DIAGNOSIS — R0602 Shortness of breath: Secondary | ICD-10-CM

## 2021-08-17 DIAGNOSIS — Z79899 Other long term (current) drug therapy: Secondary | ICD-10-CM

## 2021-08-17 DIAGNOSIS — Z951 Presence of aortocoronary bypass graft: Secondary | ICD-10-CM | POA: Diagnosis not present

## 2021-08-17 DIAGNOSIS — I2510 Atherosclerotic heart disease of native coronary artery without angina pectoris: Secondary | ICD-10-CM

## 2021-08-17 DIAGNOSIS — E785 Hyperlipidemia, unspecified: Secondary | ICD-10-CM | POA: Diagnosis not present

## 2021-08-17 MED ORDER — AMIODARONE HCL 200 MG PO TABS: 200.0000 mg | ORAL_TABLET | Freq: Every day | ORAL | 3 refills | Status: DC

## 2021-08-17 NOTE — Patient Instructions (Signed)
Medication Instructions:  STOP digoxin TAKE lasix 20mg  daily   *If you need a refill on your cardiac medications before your next appointment, please call your pharmacy*   Lab Work: CMET, TSH, BNP today  If you have labs (blood work) drawn today and your tests are completely normal, you will receive your results only by: [place] (if you have MyChart) OR A paper copy in the mail If you have any lab test that is abnormal or we need to change your treatment, we will call you to review the results.   Testing/Procedures: NONE   Follow-Up: At [facility], you and your health needs are our priority.  As part of our continuing mission to provide you with exceptional heart care, we have created designated Provider Care Teams.  These Care Teams include your primary Cardiologist (physician) and Advanced Practice Providers (APPs -  Physician Assistants and Nurse Practitioners) who all work together to provide you with the care you need, when you need it.  We recommend signing up for the patient portal called "MyChart".  Sign up information is provided on this After Visit Summary.  MyChart is used to connect with patients for Virtual Visits (Telemedicine).  Patients are able to view lab/test results, encounter notes, upcoming appointments, etc.  Non-urgent messages can be sent to your provider as well.   To learn more about what you can do with MyChart, go to [email or web address].    Your next appointment:   4 month(s)  The format for your next appointment:   In Person  Provider:   You may see Pixie Casino, MD or one of the following Advanced Practice Providers on your designated Care Team:   Almyra Deforest, PA-C Fabian Sharp, PA-C or  Roby Lofts, [place]   Other Instructions

## 2021-08-17 NOTE — Progress Notes (Signed)
OFFICE NOTE  Chief Complaint:  Follow-up CABG, afib  Primary Care Physician: Sueanne Margarita, DO  HPI:  Oscar [name] is a 75 y.o. male who is a history of moderate obesity, dyslipidemia, hypertension and type 2 diabetes. There is a strong family history of heart disease with his father who died of a massive heart attack at age 84 and a brother who died of an MI at age 55. Recently he's noted some increase in palpitations. He saw his primary care provider who did EKGs and noted he was having PACs. He was started on metoprolol tartrate 25 mg twice a day. He reports marked improvement in his symptoms over his EKG still shows PACs. Heart rate however is lower and blood pressure which was in the 096G and 836 systolic is now 629/47 in the office today. Oscar [name] denies any chest pain but notes that he does get short of breath and sweats very easily with minimal exertion. Although his overweight his weight is been elevated for some time and not necessarily increasing to explain the new change in his symptoms. He's felt more fatigued recently as well. Of note he previously seen [facility] in the early 2000's. He underwent heart catheterization 2002, which showed normal LV systolic function and three-vessel nonobstructive coronary disease. He was noted to have a 30% ostial LAD stenosis, a 50% first diagonal branch stenosis, and apical 70% LAD stenosis. The left circumflex had a 40% stenosis in the mid vessel and diffuse 20% distal stenosis. Right coronary artery was codominant and noted to have 50% diffuse stenosis in the mid vessel and a tubular 60% distal stenosis. Medical therapy was recommended. EKG today shows sinus rhythm with PACs and left anterior fascicular block. There was voltage criteria for LVH and lateral T-wave inversions concerning for ischemia.  07/24/2016  Oscar [name] returns today for follow-up of his studies. He underwent nuclear stress testing and ultimately had an echocardiogram  due to abnormal nuclear findings. Nuclear stress test showed no reversible ischemia and a fixed basal inferior defect concerning for probable artifact. LVEF was decreased at 42% however the nuclear readers suggested that the EF appeared to be normal. Subsequent echocardiography showed normal LV function without regional wall motion abnormalities and mild diastolic dysfunction. I shared those results of Oscar [name] today and feel that they're fairly reassuring. He's not describing chest pain but does get short of breath and sweats easily with minimal exertion. This could be weight related. He does note improvement with metoprolol with regards to his PACs. He says he no longer has any palpitations. He is on optimal medical therapy for diabetes, cholesterol and is on daily aspirin.  01/21/2017  Oscar [name] was seen today in follow-up. He denies any worsening shortness of breath or chest pain. His EKG is unchanged showing persistent T-wave inversions. Echo which showed mild diastolic dysfunction with normal LVEF. I've encouraged him to work on exercise and weight loss however there is not been a lot of that. He recently played golf the other weekend said that he was able to walk around without any difficulty but then had to walk several blocks to go to dinner and became a little short of breath. He reports his blood sugars been well controlled with hemoglobin A1c is 6. Cholesterol is at goal as well. He takes only half tablet of Lasix for some intermittent swelling of his legs mostly related to venous hypertension.  03/02/2018  Oscar [name] returns today for follow-up.  Overall he is  done well.  He denies any worsening chest pain or shortness of breath.  He has been playing some golf.  Recently has been having problems with his back.  He is followed by Dr. Sherwood Gambler.  He has had some leg pain which is unclear if it is related to his back or peripheral arterial disease.  He reports cool feet and has answered yes to some  notable lower extremity hair loss.  He has a history of hypertension and dyslipidemia as described.  His EKG actually looks somewhat better today, but does show sinus rhythm with PACs, LVH and nonspecific T wave changes laterally.  Lab work is pending from his PCP, however he said his hemoglobin A1c was 6.3 on metformin.  05/30/2020  Oscar [name] seen today in follow-up.  Is been 2 years since I last saw him.  He has followed up with his PCP back in June.  The time his A1c was 7.9.  Cholesterol was assessed in January.  Total was 172, HDL 37, triglycerides 115 and LDL 112.  His target LDL should probably be less than 70 given diabetes and known CAD by cath in 2002.  He is on pravastatin.  We discussed the possibility of adding ezetimibe or more aggressive dietary invention and weight loss.  It does appear that he is down a few pounds since his visit in June.  He has some difficulty exercising due to back issues.  He recently was started on vitamin B12 with some improvement in what is thought to be peripheral neuropathy as he is described "cool feet" but had essentially normal lower extremity arterial Dopplers in 2019.  08/21/2020  Oscar [name] seen today for follow-up.  He has reported some progressive shortness of breath and fatigue. He occasionally gets tingling in his left arm and left anterior chest wall, sometimes worse with exertion.  Blood pressure was elevated today however at 161/84.  He reports home blood pressure readings are better.  EKG today shows a sinus rhythm with some PACs.  He denies any palpitations or arrhythmias.  He had cholesterol testing in January 2021 showed total cholesterol 172, HDL 37, LDL 112 and triglycerides 115.  Hemoglobin A1c as of June was 7.9.  He is working with his PCP on his blood sugars.  LDL still remains above target.  He has ongoing complaints of cool feet which feel warm but are subjectively cool and has had diagnosis of neuropathy.  He also had lower extremity arterial  Dopplers in the past which were normal.  10/09/2020  Oscar [name] returns today for follow-up.  He underwent stress testing on November 16.  There was a small inferior attenuation artifact which improved with upright imaging.  This is more consistent with bowel shadowing.  No ischemia was noted.  EF was at 49% however an echo in the past that showed a low normal EF around 50%.  He really denies any shortness of breath.  He had been complaining of left arm symptoms really concerning for a radiculopathy.  He saw Dr.Kuzma who did nerve conduction testing and from what he explained to me it sounds like he may have left ulnar nerve entrapment at the elbow and may need surgery.  From a cardiac standpoint he is at acceptable risk for this.  06/22/2021  Oscar [name] is seen today as an urgent follow-up.  He called in the office with concerns about worsening shortness of breath and chest pressure.  This is developed over the past week or so.  He did have COVID-19 I think in July.  He said he did recover fully from that but the symptoms developed afterwards.  He has had chest discomfort symptoms before.  He had some known mild coronary disease by catheter remotely in 2002 but did have a family history of early onset heart disease and has numerous cardiovascular risk factors including obesity, diabetes, dyslipidemia and hypertension.  He denies any particular chest pain.  EKG today actually shows a normal sinus rhythm with left anterior fascicular block at 67.  Blood pressure is excellent 118/72.  Recent lab work included a lipid profile showing total cholesterol 173, HDL 41, LDL 113 and triglycerides 97.  A1c of 6.2, liver enzymes were normal.  07/05/2021  Oscar [name] returns for follow-up.  He was seen just a few weeks ago for progressive dyspnea on exertion and chest pressure.  This had presented and worsened over the past week.  He thought it was related initially to COVID-19 but he did recover somewhat from the symptoms.  I  referred him for coronary CT angiogram which was performed on 06/28/2021.  Image quality was felt to be suboptimal however the calcium score was significantly elevated at 2381.  97th percentile for age and sex matched controls.  Coronary arteries were thought to be diffusely diseased with severe stenosis in the right coronary artery, LAD and circumflex arteries-CAD RADS 4B.  Based on these findings, cardiac catheterization was recommended.  I reviewed the images and I agree with this recommendation.  I discussed this with the patient and his wife today as well as his daughter via teleconference.  We did discuss the procedure which will likely be radial catheterization at length including the risk, benefits and alternatives today and he is agreeable to proceed.  08/17/2021  Oscar [name] returns today for follow-up.  He underwent fairly urgent cardiac catheterization for symptoms concerning for unstable angina.  He had a coronary CT angiogram which showed significant calcium over 2000 and probable severe stenosis in the right coronary artery LAD and circumflex arteries.  Cath did demonstrate severe three-vessel coronary disease.  He was referred for coronary artery bypass grafting.  He underwent CABG x5 on 07/16/2021 with LIMA to LAD, SVG to diagonal, SVG to PDA and sequential SVG to OM 2 and OM 3.  Overall he tolerated this well however developed atrial fibrillation perioperatively.  He was treated with amiodarone and developed atrial flutter for which he had rapid atrial pacing but did not convert him.  He then went in and out of atrial fibrillation but ultimately converted back to a sinus rhythm.  He was recently seen in follow-up and his amiodarone was decreased further to 200 mg daily.  He also was maintained on digoxin.  Weight has been pretty stable at around 247 pounds at home.  He reports some fatigue but is generally improving.  PMHx:  Past Medical History:  Diagnosis Date   Anesthesia complication     PER PT, "HARD TO WAKE' UP PAST SHOULDER SURGERY IN 2016   Arthritis    lumbar, spondylosis, DDD,stenosis    Atrial premature depolarization    Chronic right hip pain    and right leg pain   Coronary artery disease    Diabetes mellitus without complication (HCC)    Dysrhythmia    pt. reports that he has been told in the past that he has  irreg. heattbeat    Edema    Gout    bil ankles   Hyperlipidemia  Hypertension    Rotator cuff tear    right   Shortness of breath    Swelling of extremity    bil feet   Type 2 diabetes mellitus with hyperglycemia [facility])     Past Surgical History:  Procedure Laterality Date   BACK SURGERY     January 2014   CARDIAC CATHETERIZATION     2004, outcome good, to be followed for/with medical therapy    CORONARY ARTERY BYPASS GRAFT N/A 07/16/2021   Procedure: CORONARY ARTERY BYPASS GRAFTING (CABG) X FIVE, USING LEFT INTERNAL MAMMARY ARTERY AND RIGHT LEG GREATER SAPHENOUS VEIN HARVESTED ENDOSCOPICALLY;  Surgeon: Melrose Nakayama, MD;  Location: [facility];  Service: Open Heart Surgery;  Laterality: N/A;   ELBOW BURSA SURGERY     removed bursacs both elbows   ENDOVEIN HARVEST OF GREATER SAPHENOUS VEIN  07/16/2021   Procedure: ENDOVEIN HARVEST OF GREATER SAPHENOUS VEIN;  Surgeon: Melrose Nakayama, MD;  [facility];  Service: Open Heart Surgery;;   FASCIECTOMY Left 12/12/2020   Procedure: FASCIECTOMY LEFT SMALL FINGER;  Surgeon: Daryll Brod, MD;  Location: [facility];  Service: Orthopedics;  Laterality: Left;   LEFT HEART CATH AND CORONARY ANGIOGRAPHY N/A 07/12/2021   Procedure: LEFT HEART CATH AND CORONARY ANGIOGRAPHY;  Surgeon: Burnell Blanks, MD;  Location: [facility];  Service: Cardiovascular;  Laterality: N/A;   SHOULDER ARTHROSCOPY WITH ROTATOR CUFF REPAIR AND SUBACROMIAL DECOMPRESSION Right 08/21/2015   Procedure: SHOULDER ARTHROSCOPY WITH ROTATOR CUFF REPAIR S UBACROMIAL DECOMPRESSION, ;  Surgeon: Tania Ade, MD;  Location: [facility];  Service: Orthopedics;  Laterality: Right;  Right shoulder arthroscopy rotator cuff repair vs debridement, subacromial decompression, possible biceps tenotomy.   TEE WITHOUT CARDIOVERSION N/A 07/16/2021   Procedure: TRANSESOPHAGEAL ECHOCARDIOGRAM (TEE);  Surgeon: Melrose Nakayama, MD;  Location: [facility];  Service: Open Heart Surgery;  Laterality: N/A;    FAMHx:  Family History  Problem Relation Age of Onset   Heart disease Father    Diabetes Father    Heart disease Brother    Diabetes Paternal Grandmother    Colon cancer Neg Hx    Esophageal cancer Neg Hx    Rectal cancer Neg Hx    Stomach cancer Neg Hx     SOCHx:   reports that he quit smoking about 47 years ago. His smoking use included cigarettes. He has a 3.00 pack-year smoking history. He has never used smokeless tobacco. He reports that he does not currently use alcohol. He reports that he does not use drugs.  ALLERGIES:  Allergies  Allergen Reactions   Yellow Fever Vaccine Hives    ROS: Pertinent items noted in HPI and remainder of comprehensive ROS otherwise negative.  HOME MEDS: Current Outpatient Medications  Medication Sig Dispense Refill   amiodarone (PACERONE) 400 MG tablet Take 1 tablet (400 mg total) by mouth 2 (two) times daily. For 7 days then once daily 37 tablet 1   apixaban (ELIQUIS) 5 MG TABS tablet Take 1 tablet (5 mg total) by mouth 2 (two) times daily. 60 tablet 1   aspirin 81 MG tablet Take 81 mg by mouth daily.     atorvastatin (LIPITOR) 80 MG tablet Take 1 tablet (80 mg total) by mouth daily. 60 tablet 1   digoxin (LANOXIN) 0.125 MG tablet Take 1 tablet (0.125 mg total) by mouth daily. 30 tablet 1   furosemide (LASIX) 20 MG tablet Take 1 tablet (20 mg total) by mouth daily as  needed for edema. 30 tablet 1   halobetasol (ULTRAVATE) 0.05 % cream Apply 1 application topically 2 (two) times daily as needed (psoriasis).     hydrocortisone 2.5 % cream  Apply topically 2 (two) times daily. (Patient taking differently: Apply 1 application topically 2 (two) times daily as needed (psoriasis).) 30 g 4   Magnesium 250 MG TABS Take 500 mg by mouth daily.     metFORMIN (GLUCOPHAGE) 500 MG tablet Take 500-1,000 mg by mouth See admin instructions. 500 mg in the morning, 1000 mg in the evening     metoprolol succinate (TOPROL XL) 25 MG 24 hr tablet Take 0.5 tablets (12.5 mg total) by mouth at bedtime. 30 tablet 3   potassium chloride SA (KLOR-CON) 20 MEQ tablet Take 1 tablet (20 mEq total) by mouth daily. 3 tablet 0   vitamin B-12 (CYANOCOBALAMIN) 500 MCG tablet Take 500 mcg by mouth daily.     omeprazole (PRILOSEC OTC) 20 MG tablet Take 20 mg by mouth daily. (Patient not taking: Reported on 08/17/2021)     oxyCODONE (OXY IR/ROXICODONE) 5 MG immediate release tablet Take 1 tablet (5 mg total) by mouth every 6 (six) hours as needed for severe pain. (Patient not taking: Reported on 08/17/2021) 28 tablet 0   No current facility-administered medications for this visit.   Facility-Administered Medications Ordered in Other Visits  Medication Dose Route Frequency Provider Last Rate Last Admin   technetium tetrofosmin (TC-MYOVIEW) injection 70.7 millicurie  86.7 millicurie Intravenous Once PRN Lelon Perla, MD        LABS/IMAGING: No results found for this or any previous visit (from the past 48 hour(s)). No results found.  WEIGHTS: Wt Readings from Last 3 Encounters:  08/17/21 251 lb 3.2 oz (113.9 kg)  08/14/21 253 lb (114.8 kg)  08/01/21 262 lb 9.6 oz (119.1 kg)    VITALS: BP 112/64 (BP Location: Left Arm, Patient Position: Sitting, Cuff Size: Large)   Pulse 85   Ht 6' (1.829 m)   Wt 251 lb 3.2 oz (113.9 kg)   SpO2 94%   BMI 34.07 kg/m   EXAM: General appearance: alert and no distress Neck: no carotid bruit, no JVD, and thyroid not enlarged, symmetric, no tenderness/mass/nodules Lungs: clear to auscultation bilaterally Heart: regular  rate and rhythm, S1, S2 normal, no murmur, click, rub or gallop and well-healed midline scar Abdomen: soft, non-tender; bowel sounds normal; no masses,  no organomegaly Extremities: extremities normal, atraumatic, no cyanosis or edema Pulses: 2+ and symmetric Skin: Skin color, texture, turgor normal. No rashes or lesions Neurologic: Grossly normal Psych: Pleasant  EKG: Normal sinus rhythm at 85, left anterior fascicular block, lateral T wave changes-personally reviewed  ASSESSMENT: CAD status post CABG x5 with LIMA to LAD, SVG to diagonal, SVG to PDA and sequential SVG to OM 2 and OM 3 (07/16/2021) Abnormal coronary CT angiogram with calcium score 2381, 97th percentile and three-vessel coronary disease (06/28/2021). Chest pressure, dyspnea on exertion -low risk Myoview stress test (09/2020) Low risk Myoview stress test and normal LVEF by echo 60-65% (2017) PVCs and palpitations Abnormal EKG with T-wave inversions History of moderate nonobstructive coronary artery disease by cath in 2002 Strong family history of premature coronary disease in his father and brother who died of MIs in their 25s Type 2 diabetes Dyslipidemia Hypertension Perioperative atrial fibrillation.  PLAN: 1.   Oscar [name] was found to have multivessel coronary disease and underwent cardiac catheterization which confirmed this.  He ultimately required coronary artery bypass grafting  x5 and this was complicated by perioperative atrial fibrillation.  He has since converted back to sinus rhythm.  His amiodarone was decreased by CT surgery down to 200 mg daily.  He remains on digoxin which I advised stopping today.  We will check a TSH, c-Met and BNP.  Would also advise taking furosemide 20 mg daily rather than as needed.  He recently had some pulmonary edema and the surgical APP increased his furosemide for a few days to 40 mg and I would advise he continue on 20 mg daily.  His labs will be helpful to determine if he should  continue with that.  Plan follow-up with me in 4 months.  Pixie Casino, MD, [facility], [place] Director of the Advanced Lipid Disorders &  Cardiovascular Risk Reduction Clinic Diplomate of the American Board of Clinical Lipidology Attending Cardiologist  Direct Dial: [phone]  Fax: [phone]  Website:  [email or web address] [email or web address] Aubre Quincy 08/17/2021, 10:00 AM

## 2021-08-18 LAB — TSH: TSH: 3.46 u[IU]/mL (ref 0.450–4.500)

## 2021-08-18 LAB — BRAIN NATRIURETIC PEPTIDE: BNP: 121.6 pg/mL — ABNORMAL HIGH (ref 0.0–100.0)

## 2021-08-18 LAB — COMPREHENSIVE METABOLIC PANEL
ALT: 16 IU/L (ref 0–44)
AST: 20 IU/L (ref 0–40)
Albumin/Globulin Ratio: 1.6 (ref 1.2–2.2)
Albumin: 4.2 g/dL (ref 3.7–4.7)
Alkaline Phosphatase: 89 IU/L (ref 44–121)
BUN/Creatinine Ratio: 9 — ABNORMAL LOW (ref 10–24)
BUN: 14 mg/dL (ref 8–27)
Bilirubin Total: 0.9 mg/dL (ref 0.0–1.2)
CO2: 21 mmol/L (ref 20–29)
Calcium: 9.8 mg/dL (ref 8.6–10.2)
Chloride: 100 mmol/L (ref 96–106)
Creatinine, Ser: 1.48 mg/dL — ABNORMAL HIGH (ref 0.76–1.27)
Globulin, Total: 2.7 g/dL (ref 1.5–4.5)
Glucose: 130 mg/dL — ABNORMAL HIGH (ref 70–99)
Potassium: 4.4 mmol/L (ref 3.5–5.2)
Sodium: 140 mmol/L (ref 134–144)
Total Protein: 6.9 g/dL (ref 6.0–8.5)
eGFR: 49 mL/min/{1.73_m2} — ABNORMAL LOW (ref >59)

## 2021-08-20 ENCOUNTER — Telehealth: Payer: Self-pay | Admitting: Internal Medicine

## 2021-08-20 ENCOUNTER — Other Ambulatory Visit: Payer: Self-pay | Admitting: Thoracic Surgery (Cardiothoracic Vascular Surgery)

## 2021-08-20 DIAGNOSIS — Z951 Presence of aortocoronary bypass graft: Secondary | ICD-10-CM

## 2021-08-20 NOTE — Telephone Encounter (Signed)
OV 10/14-dig stopped by Dr. Debara Pickett  Colchicine not on current medication list   Attempt to call [facility] pharmacy-pharmacy currently closed and opens at 10AM

## 2021-08-20 NOTE — Telephone Encounter (Signed)
Attempt to call [facility] pharmacy-no answer, unable to leave message.

## 2021-08-20 NOTE — Telephone Encounter (Signed)
Pt c/o medication issue:  1. Name of Medication: colchicine tablet 0.6 mg / digoxin (LANOXIN) 0.25 MG/ML injection 0.25 mg  2. How are you currently taking this medication (dosage and times per day)? N/a  3. Are you having a reaction (difficulty breathing--STAT)? no  4. What is your medication issue?  [facility] pharmacy calling to inform that there is a major reaction when these two medications are taken together... edu that from the pts chart pt should no longer be taking colchicine tablet 0.6 mg but instead should only be taking digoxin (LANOXIN) 0.25 MG/ML injection 0.25 mg but from pts med list it shows this medication was due to end 07/19/21. Could you please clarify this? and then contact the pharmacy to update.Marland KitchenMarland Kitchen

## 2021-08-22 NOTE — Telephone Encounter (Signed)
Spoke with Judson Roch, Education administrator, at [facility] who states they spoke with patient's wife yesterday and are aware that patient is no longer taking colchicine or digoxin. She thanked me for the call.

## 2021-08-23 ENCOUNTER — Other Ambulatory Visit: Payer: Self-pay

## 2021-08-23 ENCOUNTER — Ambulatory Visit
Admission: RE | Admit: 2021-08-23 | Discharge: 2021-08-23 | Disposition: A | Discharge status: Home / Self Care | Payer: Medicare PPO | Source: Ambulatory Visit | Attending: Thoracic Surgery (Cardiothoracic Vascular Surgery) | Admitting: Thoracic Surgery (Cardiothoracic Vascular Surgery)

## 2021-08-23 ENCOUNTER — Encounter: Payer: Self-pay | Admitting: Physician Assistant

## 2021-08-23 ENCOUNTER — Ambulatory Visit (INDEPENDENT_AMBULATORY_CARE_PROVIDER_SITE_OTHER): Payer: Self-pay | Admitting: Physician Assistant

## 2021-08-23 VITALS — Ht 72.0 in

## 2021-08-23 DIAGNOSIS — Z951 Presence of aortocoronary bypass graft: Secondary | ICD-10-CM | POA: Diagnosis not present

## 2021-08-23 DIAGNOSIS — R918 Other nonspecific abnormal finding of lung field: Secondary | ICD-10-CM | POA: Diagnosis not present

## 2021-08-23 DIAGNOSIS — J90 Pleural effusion, not elsewhere classified: Secondary | ICD-10-CM | POA: Diagnosis not present

## 2021-08-23 NOTE — Progress Notes (Signed)
[address]       [place], [address]             [phone]     Oscar [name] is a 75 y.o. male patient underwent a CABG x 5 on 9/12. He did develop rapid atrial fibrillation post op and was started on Amio IV protocol.  He required additional Amiodarone boluses. Rapid atrial fibrillation persisted on post-op day 3 and was treated with and additional amiodarone bolus and digoxin loading. Potassium was supplemented as required and the metoprolol was also up-titrated.  He converted to NSR with PACs.  He was transitioned to oral Amiodarone. He was anticoagulated with Eliquis.We did add back his lasix for fluid overload.    Oscar [name] returns to the office for a 2-week follow-up appointment.  He was seen 2 weeks ago for his routine postop appointment and had a left moderate pleural effusion that we have been watching.  His Lasix was increased to 40 mg for a few days and then he was instructed to take his 20 mg tablets daily thereafter.  He states that shortness of breath has improved since we saw him last.   No diagnosis found. Past Medical History:  Diagnosis Date   Anesthesia complication    PER PT, "HARD TO WAKE' UP PAST SHOULDER SURGERY IN 2016   Arthritis    lumbar, spondylosis, DDD,stenosis    Atrial premature depolarization    Chronic right hip pain    and right leg pain   Coronary artery disease    Diabetes mellitus without complication (HCC)    Dysrhythmia    pt. reports that he has been told in the past that he has  irreg. heattbeat    Edema    Gout    bil ankles   Hyperlipidemia    Hypertension    Rotator cuff tear    right   Shortness of breath    Swelling of extremity    bil feet   Type 2 diabetes mellitus with hyperglycemia ([name])    No past surgical history pertinent negatives on file. Scheduled Meds: Current Outpatient Medications on File Prior to Visit  Medication Sig Dispense Refill   amiodarone (PACERONE) 200 MG tablet Take 1 tablet  (200 mg total) by mouth daily. 90 tablet 3   apixaban (ELIQUIS) 5 MG TABS tablet Take 1 tablet (5 mg total) by mouth 2 (two) times daily. 60 tablet 1   aspirin 81 MG tablet Take 81 mg by mouth daily.     atorvastatin (LIPITOR) 80 MG tablet Take 1 tablet (80 mg total) by mouth daily. 60 tablet 1   furosemide (LASIX) 20 MG tablet Take 20 mg by mouth daily.     halobetasol (ULTRAVATE) 0.05 % cream Apply 1 application topically 2 (two) times daily as needed (psoriasis).     hydrocortisone 2.5 % cream Apply topically 2 (two) times daily. (Patient taking differently: Apply 1 application topically 2 (two) times daily as needed (psoriasis).) 30 g 4   Magnesium 250 MG TABS Take 500 mg by mouth daily.     metFORMIN (GLUCOPHAGE) 500 MG tablet Take 500-1,000 mg by mouth See admin instructions. 500 mg in the morning, 1000 mg in the evening     metoprolol succinate (TOPROL XL) 25 MG 24 hr tablet Take 0.5 tablets (12.5 mg total) by mouth at bedtime. 30 tablet 3   omeprazole (PRILOSEC OTC) 20 MG tablet Take 20 mg by mouth daily.  oxyCODONE (OXY IR/ROXICODONE) 5 MG immediate release tablet Take 1 tablet (5 mg total) by mouth every 6 (six) hours as needed for severe pain. 28 tablet 0   potassium chloride SA (KLOR-CON) 20 MEQ tablet Take 1 tablet (20 mEq total) by mouth daily. 3 tablet 0   vitamin B-12 (CYANOCOBALAMIN) 500 MCG tablet Take 500 mcg by mouth daily.     Current Facility-Administered Medications on File Prior to Visit  Medication Dose Route Frequency Provider Last Rate Last Admin   technetium tetrofosmin (TC-MYOVIEW) injection 93.7 millicurie  16.9 millicurie Intravenous Once PRN Crenshaw, Denice Bors, MD           Allergies  Allergen Reactions   Yellow Fever Vaccine Hives   Active Problems:   * No active hospital problems.    Cor: RRR, no murmur Pulm: CTA bilaterally and in all fields Abd: no tenderness, + bowel sounds Ext: 1+ bilateral pitting edema Wound: clean and dry, no signs of  infection.    CLINICAL DATA:  75 year old male status post CABG in September.   EXAM: CHEST - 2 VIEW   COMPARISON:  08/14/2021 and earlier.   FINDINGS: Improved lung volumes, but moderate opacification of the left lung base which appears related to pleural effusion persists. No superimposed pneumothorax or pulmonary edema. The right lung appears clear. Stable cardiac size and mediastinal contours. Mildly tortuous thoracic aorta. Visualized tracheal air column is within normal limits. No acute osseous abnormality identified. Negative visible bowel gas pattern.   IMPRESSION: 1. Moderate left pleural effusion persists. 2. No other acute cardiopulmonary abnormality.     Electronically Signed   By: Genevie Ann M.D.   On: 08/23/2021 11:01   Assessment & Plan  He overall feels okay today. He has been staying mostly downstairs for fear of following while climbing the stairs therefore has only been doing "bird baths". Steri strips removed and incision cleaned with betadine.   He may drive from our standpoint since he is off narcotic medication and hasn't taken any for a few weeks He is to shower daily with soap and water.  Wash all incisions with soap and water and pat everything dry with a clean towel. He is signed up for cardiac rehab and on the waiting list He needs to walk 3x daily which he has been doing but can increase.  We discussed adding on a minute to each walk per day. We discussed using his incentive spirometer to assist with residual fluid and atelectasis.  He is to try to increase his use of his spirometer from 3 times a day to 6 times a day. Fluid overload-he has a persistent left pleural effusion which seems slightly better on today's chest x-ray.  He states that shortness of breath has improved.  I think at this time there is not enough fluid for a thoracentesis however I stressed that he will need to continue his 20 mg of Lasix daily, 2-3 walks daily, and use of his  incentive spirometer.  I do think the fluid will slowly resolve over time. NSR today but hx of afib. Postop.  He remains on Eliquis, aspirin 81 mg, and amiodarone 200 mg daily.  He recently saw Dr. Debara Pickett and was taken off of his digoxin.  He will be followed long-term by cardiology and may need additional studies done to see if he needs to stay on the amiodarone long-term.   Plan: Continue current medication regimen.  Overall, doing very well postop.  I do not think he  needs any routine follow-up at this time.  He can call our office if he has increased shortness of breath or any other symptoms that warrant close follow-up.  I would recommend him following up with his primary care and cardiologist.  [name] 08/14/2021

## 2021-08-28 DIAGNOSIS — I119 Hypertensive heart disease without heart failure: Secondary | ICD-10-CM | POA: Diagnosis not present

## 2021-08-28 DIAGNOSIS — E119 Type 2 diabetes mellitus without complications: Secondary | ICD-10-CM | POA: Diagnosis not present

## 2021-08-28 DIAGNOSIS — I491 Atrial premature depolarization: Secondary | ICD-10-CM | POA: Diagnosis not present

## 2021-08-28 DIAGNOSIS — G8929 Other chronic pain: Secondary | ICD-10-CM | POA: Diagnosis not present

## 2021-08-28 DIAGNOSIS — I25110 Atherosclerotic heart disease of native coronary artery with unstable angina pectoris: Secondary | ICD-10-CM | POA: Diagnosis not present

## 2021-08-28 DIAGNOSIS — I441 Atrioventricular block, second degree: Secondary | ICD-10-CM | POA: Diagnosis not present

## 2021-08-28 DIAGNOSIS — M109 Gout, unspecified: Secondary | ICD-10-CM | POA: Diagnosis not present

## 2021-08-28 DIAGNOSIS — Z48812 Encounter for surgical aftercare following surgery on the circulatory system: Secondary | ICD-10-CM | POA: Diagnosis not present

## 2021-08-28 DIAGNOSIS — I493 Ventricular premature depolarization: Secondary | ICD-10-CM | POA: Diagnosis not present

## 2021-08-30 ENCOUNTER — Telehealth: Payer: Self-pay | Admitting: Internal Medicine

## 2021-08-30 MED ORDER — POTASSIUM CHLORIDE CRYS ER 20 MEQ PO TBCR: 20.0000 meq | EXTENDED_RELEASE_TABLET | Freq: Every day | ORAL | 1 refills | Status: DC

## 2021-08-30 NOTE — Telephone Encounter (Signed)
Patient would like to speak to Dr. Debara Pickett or the APP, would not go into detail.

## 2021-08-30 NOTE — Telephone Encounter (Signed)
Left message for Glenard Haring to return call  Rx(s) sent to pharmacy electronically.

## 2021-08-30 NOTE — Telephone Encounter (Signed)
Ok to continue potassium 20 MEQ daily  Dr Lemmie Evens

## 2021-08-30 NOTE — Telephone Encounter (Signed)
Oscar [name], wants to know if patient should be taking potassium.

## 2021-08-30 NOTE — Telephone Encounter (Signed)
Spoke with Glenard Haring with [facility]  She said patient was told by [facility] (w/[facility]) that they would check with Dr. Debara Pickett about continuing K+ since patient is continuing on lasix 20mg  daily.   Advised will send to MD  Patient uses [place] back w/reply

## 2021-08-30 NOTE — Telephone Encounter (Signed)
He's more than 1 month out from surgery - has been cleared to resume all activities.  Would be ok to resume sexual activity.  Dr. Lemmie Evens

## 2021-08-30 NOTE — Telephone Encounter (Signed)
Returned call to patient of Dr. Debara Pickett   Patient would like to know if it OK to engage in intimate interactions with spouse after recent surgery or if this is too soon  Advised will send to MD

## 2021-08-31 NOTE — Telephone Encounter (Signed)
Spoke with patient and advised him of MD note -- take potassium 91mEq daily. Notified him Rx was sent to [facility]

## 2021-08-31 NOTE — Telephone Encounter (Signed)
Patient called w/message from MD. He verbalized understanding

## 2021-09-10 ENCOUNTER — Other Ambulatory Visit: Payer: Self-pay | Admitting: Thoracic Surgery (Cardiothoracic Vascular Surgery)

## 2021-09-10 DIAGNOSIS — Z951 Presence of aortocoronary bypass graft: Secondary | ICD-10-CM

## 2021-09-11 ENCOUNTER — Ambulatory Visit (INDEPENDENT_AMBULATORY_CARE_PROVIDER_SITE_OTHER): Payer: Self-pay | Admitting: Thoracic Surgery (Cardiothoracic Vascular Surgery)

## 2021-09-11 ENCOUNTER — Ambulatory Visit: Payer: Self-pay | Admitting: Thoracic Surgery (Cardiothoracic Vascular Surgery)

## 2021-09-11 ENCOUNTER — Ambulatory Visit
Admission: RE | Admit: 2021-09-11 | Discharge: 2021-09-11 | Disposition: A | Discharge status: Home / Self Care | Payer: Medicare PPO | Source: Ambulatory Visit | Attending: Thoracic Surgery (Cardiothoracic Vascular Surgery) | Admitting: Thoracic Surgery (Cardiothoracic Vascular Surgery)

## 2021-09-11 ENCOUNTER — Other Ambulatory Visit: Payer: Self-pay

## 2021-09-11 VITALS — BP 118/79 | HR 84 | Resp 20 | Ht 72.0 in | Wt 247.0 lb

## 2021-09-11 DIAGNOSIS — Z951 Presence of aortocoronary bypass graft: Secondary | ICD-10-CM | POA: Diagnosis not present

## 2021-09-11 DIAGNOSIS — J90 Pleural effusion, not elsewhere classified: Secondary | ICD-10-CM

## 2021-09-11 MED ORDER — PREDNISONE 10 MG (21) PO TBPK: ORAL_TABLET | ORAL | 0 refills | Status: AC

## 2021-09-11 NOTE — Progress Notes (Signed)
[address]       [place] [address]             [phone]       HPI: Oscar [name] returns for a scheduled postoperative follow-up visit  Oscar [name] is a 75 year old man with a history of obesity, dyslipidemia, hypertension, and type 2 diabetes.  He presented with palpitations and shortness of breath with exertion.  He was found to have three-vessel disease.  He underwent coronary bypass grafting x5 on 07/16/2021.  He was noted to have diffusely diseased coronaries.  He had atrial fibrillation postoperatively.  He went home on amiodarone and Eliquis.  He is still on those medications.  He has seen Dr. Debara Pickett in follow-up.  His follow-up chest x-rays have shown a "moderate" left pleural effusion.  Increasing his Lasix dose had no effect.  Past Medical History:  Diagnosis Date   Anesthesia complication    PER PT, "HARD TO WAKE' UP PAST SHOULDER SURGERY IN 2016   Arthritis    lumbar, spondylosis, DDD,stenosis    Atrial premature depolarization    Chronic right hip pain    and right leg pain   Coronary artery disease    Diabetes mellitus without complication (HCC)    Dysrhythmia    pt. reports that he has been told in the past that he has  irreg. heattbeat    Edema    Gout    bil ankles   Hyperlipidemia    Hypertension    Rotator cuff tear    right   Shortness of breath    Swelling of extremity    bil feet   Type 2 diabetes mellitus with hyperglycemia (HCC)     Current Outpatient Medications  Medication Sig Dispense Refill   predniSONE (STERAPRED UNI-PAK 21 TAB) 10 MG (21) TBPK tablet Take 5 tablets (50 mg total) by mouth daily for 1 day, THEN 4 tablets (40 mg total) daily for 1 day, THEN 3 tablets (30 mg total) daily for 1 day, THEN 2 tablets (20 mg total) daily for 1 day, THEN 1 tablet (10 mg total) daily for 1 day. 15 tablet 0   amiodarone (PACERONE) 200 MG tablet Take 1 tablet (200 mg total) by mouth daily. 90 tablet 3   apixaban (ELIQUIS) 5 MG  TABS tablet Take 1 tablet (5 mg total) by mouth 2 (two) times daily. 60 tablet 1   aspirin 81 MG tablet Take 81 mg by mouth daily.     atorvastatin (LIPITOR) 80 MG tablet Take 1 tablet (80 mg total) by mouth daily. 60 tablet 1   furosemide (LASIX) 20 MG tablet Take 20 mg by mouth daily.     halobetasol (ULTRAVATE) 0.05 % cream Apply 1 application topically 2 (two) times daily as needed (psoriasis).     hydrocortisone 2.5 % cream Apply topically 2 (two) times daily. (Patient taking differently: Apply 1 application topically 2 (two) times daily as needed (psoriasis).) 30 g 4   Magnesium 250 MG TABS Take 500 mg by mouth daily.     metFORMIN (GLUCOPHAGE) 500 MG tablet Take 500-1,000 mg by mouth See admin instructions. 500 mg in the morning, 1000 mg in the evening     metoprolol succinate (TOPROL XL) 25 MG 24 hr tablet Take 0.5 tablets (12.5 mg total) by mouth at bedtime. 30 tablet 3   omeprazole (PRILOSEC OTC) 20 MG tablet Take 20 mg by mouth daily.     oxyCODONE (OXY IR/ROXICODONE)  5 MG immediate release tablet Take 1 tablet (5 mg total) by mouth every 6 (six) hours as needed for severe pain. 28 tablet 0   potassium chloride SA (KLOR-CON) 20 MEQ tablet Take 1 tablet (20 mEq total) by mouth daily. 90 tablet 1   vitamin B-12 (CYANOCOBALAMIN) 500 MCG tablet Take 500 mcg by mouth daily.     No current facility-administered medications for this visit.   Facility-Administered Medications Ordered in Other Visits  Medication Dose Route Frequency Provider Last Rate Last Admin   technetium tetrofosmin (TC-MYOVIEW) injection 19.5 millicurie  09.3 millicurie Intravenous Once PRN Lelon Perla, MD        Physical Exam BP 118/79   Pulse 84   Resp 20   Ht 6' (1.829 m)   Wt 247 lb (112 kg)   SpO2 98% Comment: RA  BMI 33.4 kg/m  75 year old man in no acute distress Alert and oriented x3 with no focal deficits Lungs slightly diminished at left base but otherwise clear Cardiac regular rate and  rhythm Sternum stable, incision well-healed No peripheral edema  Diagnostic Tests: I personally reviewed the chest x-ray images.  There is a small left pleural effusion.  Partially loculated.  Impression: Oscar [name] is a 75 year old man with multiple cardiac risk factors who presented with palpitations and shortness of breath with exertion.  He underwent coronary bypass grafting x5 about 2 months ago.  Postoperative course was notable for atrial fibrillation.  He converted to sinus before discharge.  He is doing well from a pain standpoint.  He is anxious to increase his activities.  He wants to travel to see family.  He is fine to do that.  At this point there are no restrictions on his activities but he was advised to build into new activities gradually.  He does have a lot of small to moderate left pleural effusion.  I think this is loculated laterally.  I do not think there is enough fluid to warrant thoracentesis.  I think it is reasonable to give him a trial of a prednisone taper.  I gave him prescription for prednisone 50 mg taper by 10 mg a day for 5 days.  We will repeat a chest x-ray in about 3 weeks and see if that has any effect.  Plan: Prednisone taper Return in 3 weeks with PA and lateral chest x-ray  Melrose Nakayama, MD [facility] [phone]

## 2021-09-13 DIAGNOSIS — I25110 Atherosclerotic heart disease of native coronary artery with unstable angina pectoris: Secondary | ICD-10-CM | POA: Diagnosis not present

## 2021-09-13 DIAGNOSIS — I441 Atrioventricular block, second degree: Secondary | ICD-10-CM | POA: Diagnosis not present

## 2021-09-13 DIAGNOSIS — Z48812 Encounter for surgical aftercare following surgery on the circulatory system: Secondary | ICD-10-CM | POA: Diagnosis not present

## 2021-09-13 DIAGNOSIS — G8929 Other chronic pain: Secondary | ICD-10-CM | POA: Diagnosis not present

## 2021-09-13 DIAGNOSIS — M109 Gout, unspecified: Secondary | ICD-10-CM | POA: Diagnosis not present

## 2021-09-13 DIAGNOSIS — I119 Hypertensive heart disease without heart failure: Secondary | ICD-10-CM | POA: Diagnosis not present

## 2021-09-13 DIAGNOSIS — E119 Type 2 diabetes mellitus without complications: Secondary | ICD-10-CM | POA: Diagnosis not present

## 2021-09-13 DIAGNOSIS — I493 Ventricular premature depolarization: Secondary | ICD-10-CM | POA: Diagnosis not present

## 2021-09-13 DIAGNOSIS — I491 Atrial premature depolarization: Secondary | ICD-10-CM | POA: Diagnosis not present

## 2021-09-14 DIAGNOSIS — Z23 Encounter for immunization: Secondary | ICD-10-CM | POA: Diagnosis not present

## 2021-09-14 DIAGNOSIS — D6869 Other thrombophilia: Secondary | ICD-10-CM | POA: Diagnosis not present

## 2021-09-14 DIAGNOSIS — Z951 Presence of aortocoronary bypass graft: Secondary | ICD-10-CM | POA: Diagnosis not present

## 2021-09-14 DIAGNOSIS — I4891 Unspecified atrial fibrillation: Secondary | ICD-10-CM | POA: Diagnosis not present

## 2021-09-14 DIAGNOSIS — Z7189 Other specified counseling: Secondary | ICD-10-CM | POA: Diagnosis not present

## 2021-09-18 ENCOUNTER — Other Ambulatory Visit: Payer: Self-pay | Admitting: Surgical

## 2021-09-21 ENCOUNTER — Telehealth (HOSPITAL_COMMUNITY): Payer: Self-pay | Admitting: *Deleted

## 2021-09-21 NOTE — Telephone Encounter (Signed)
Spoke with patient. Completed health history. Confirmed appointment for orientation on 09/25/21.Barnet Pall, RN,BSN 09/21/2021 3:46 PM

## 2021-09-24 ENCOUNTER — Telehealth: Payer: Self-pay | Admitting: Internal Medicine

## 2021-09-24 MED ORDER — APIXABAN 5 MG PO TABS: 5.0000 mg | ORAL_TABLET | Freq: Two times a day (BID) | ORAL | 1 refills | Status: DC

## 2021-09-24 MED ORDER — FUROSEMIDE 20 MG PO TABS: 20.0000 mg | ORAL_TABLET | Freq: Every day | ORAL | 3 refills | Status: DC

## 2021-09-24 NOTE — Telephone Encounter (Signed)
Pt c/o medication issue:  1. Name of Medication:   2. How are you currently taking this medication (dosage and times per day)?   3. Are you having a reaction (difficulty breathing--STAT)? no  4. What is your medication issue? Patient calling to see what medication he is supposed to be on. Not sure if he is supposed to be still taking the medication he got when he was in the hospital.

## 2021-09-24 NOTE — Telephone Encounter (Signed)
Spoke with patient who is in need of refills for Eliquis and Lasix. He said he was started on these meds by [facility] team but then they were deferred to Dr. Debara Pickett for refills. Advised will get these sent to [facility]. Lasix refills, routed to PharmD team for Eliquis request

## 2021-09-24 NOTE — Telephone Encounter (Signed)
Prescription refill request for Eliquis received. Indication:Afib Last office visit:10/22 Scr:1.4 Age: 75 Weight:112 kg  Prescription refilled

## 2021-09-25 ENCOUNTER — Other Ambulatory Visit: Payer: Self-pay

## 2021-09-25 ENCOUNTER — Encounter (HOSPITAL_COMMUNITY): Payer: Self-pay

## 2021-09-25 ENCOUNTER — Encounter (HOSPITAL_COMMUNITY)
Admission: RE | Admit: 2021-09-25 | Discharge: 2021-09-25 | Disposition: A | Discharge status: Home / Self Care | Payer: Medicare PPO | Source: Ambulatory Visit | Attending: Internal Medicine | Admitting: Internal Medicine

## 2021-09-25 VITALS — BP 104/60 | HR 71 | Ht 71.5 in | Wt 243.2 lb

## 2021-09-25 DIAGNOSIS — E119 Type 2 diabetes mellitus without complications: Secondary | ICD-10-CM | POA: Insufficient documentation

## 2021-09-25 DIAGNOSIS — Z951 Presence of aortocoronary bypass graft: Secondary | ICD-10-CM | POA: Insufficient documentation

## 2021-09-25 NOTE — Progress Notes (Addendum)
Cardiac Rehab Medication Review by a Nurse  Does the patient  feel that his/her medications are working for him/her?  YES   Has the patient been experiencing any side effects to the medications prescribed?  NO  Does the patient measure his/her own blood pressure or blood glucose at home? YES   Does the patient have any problems obtaining medications due to transportation or finances?   NO  Understanding of regimen: good Understanding of indications: good Potential of compliance: good    Nurse comments: Mr Susman is taking his medications as prescribed and has a good understanding of what his medications are for. Mr Mounsey checks his blood sugars and oxygen saturations daily. Mr Krohn has a BP cuff monitor but does not check his blood pressures on a daily basis.     Harrell Gave RN BSN 09/25/2021 12:45 PM

## 2021-09-25 NOTE — Progress Notes (Signed)
Cardiac Individual Treatment Plan  Patient Details  Name: Oscar [name] MRN: [id number] Date of Birth: Sep 16, 1946 Referring Provider:   Flowsheet Row CARDIAC REHAB PHASE II ORIENTATION from 09/25/2021 in [facility]  Referring Provider Lyman Bishop, MD       Initial Encounter Date:  Flowsheet Row CARDIAC REHAB PHASE II ORIENTATION from 09/25/2021 in [facility]  Date 09/25/21       Visit Diagnosis:  07/16/21 S/P CABG x 5  Patient's Home Medications on Admission:  Current Outpatient Medications:    amiodarone (PACERONE) 200 MG tablet, Take 1 tablet (200 mg total) by mouth daily., Disp: 90 tablet, Rfl: 3   apixaban (ELIQUIS) 5 MG TABS tablet, Take 1 tablet (5 mg total) by mouth 2 (two) times daily., Disp: 180 tablet, Rfl: 1   aspirin EC 81 MG tablet, Take 81 mg by mouth in the morning. Swallow whole., Disp: , Rfl:    atorvastatin (LIPITOR) 80 MG tablet, Take 1 tablet (80 mg total) by mouth daily., Disp: 60 tablet, Rfl: 1   furosemide (LASIX) 20 MG tablet, Take 1 tablet (20 mg total) by mouth daily., Disp: 90 tablet, Rfl: 3   halobetasol (ULTRAVATE) 0.05 % cream, Apply 1 application topically 2 (two) times daily as needed (psoriasis)., Disp: , Rfl:    hydrocortisone 2.5 % cream, Apply topically 2 (two) times daily. (Patient taking differently: Apply 1 application topically 2 (two) times daily as needed (psoriasis).), Disp: 30 g, Rfl: 4   Magnesium 250 MG TABS, Take 250 mg by mouth in the morning., Disp: , Rfl:    metFORMIN (GLUCOPHAGE) 500 MG tablet, Take 500-1,000 mg by mouth See admin instructions. Take 1 tablet (500 mg) by mouth in the morning & take 2 tablets (1000 mg) by mouth in the afternoon., Disp: , Rfl:    metoprolol succinate (TOPROL XL) 25 MG 24 hr tablet, Take 0.5 tablets (12.5 mg total) by mouth at bedtime., Disp: 30 tablet, Rfl: 3   omeprazole (PRILOSEC OTC) 20 MG tablet, Take 20 mg by mouth daily as needed  (indigestion/heartburn)., Disp: , Rfl:    potassium chloride SA (KLOR-CON) 20 MEQ tablet, Take 1 tablet (20 mEq total) by mouth daily. (Patient taking differently: Take 20 mEq by mouth every evening.), Disp: 90 tablet, Rfl: 1   vitamin B-12 (CYANOCOBALAMIN) 500 MCG tablet, Take 500 mcg by mouth in the morning., Disp: , Rfl:  No current facility-administered medications for this encounter.  Facility-Administered Medications Ordered in Other Encounters:    technetium tetrofosmin (TC-MYOVIEW) injection 58.0 millicurie, 99.8 millicurie, Intravenous, Once PRN, Stanford Breed, Denice Bors, MD  Past Medical History: Past Medical History:  Diagnosis Date   Anesthesia complication    PER PT, "HARD TO WAKE' UP PAST SHOULDER SURGERY IN 2016   Arthritis    lumbar, spondylosis, DDD,stenosis    Atrial premature depolarization    Chronic right hip pain    and right leg pain   Coronary artery disease    Diabetes mellitus without complication ([place])    Dysrhythmia    pt. reports that he has been told in the past that he has  irreg. heattbeat    Edema    Gout    bil ankles   Hyperlipidemia    Hypertension    Rotator cuff tear    right   Shortness of breath    Swelling of extremity    bil feet   Type 2 diabetes mellitus with hyperglycemia ([place])  Tobacco Use: Social History   Tobacco Use  Smoking Status Former   Packs/day: 0.30   Years: 10.00   Pack years: 3.00   Types: Cigarettes   Quit date: 11/04/1973   Years since quitting: 47.9  Smokeless Tobacco Never    Labs: Recent Review Flowsheet Data     Labs for ITP Cardiac and Pulmonary Rehab Latest Ref Rng & Units 07/16/2021 07/16/2021 07/16/2021 07/16/2021 07/16/2021   Hemoglobin A1c 4.8 - 5.6 % - - - - -   PHART 7.350 - 7.450 7.370 - 7.345(L) - 7.360   PCO2ART 32.0 - 48.0 mmHg 40.9 - 42.3 - 37.0   HCO3 20.0 - 28.0 mmol/L 23.6 - 23.4 21.2 20.7   TCO2 22 - 32 mmol/L 25 23 25 22 22    ACIDBASEDEF 0.0 - 2.0 mmol/L 2.0 - 3.0(H) 3.0(H) 4.0(H)   O2SAT  % 94.0 - 96.0 98.0 98.0       Capillary Blood Glucose: Lab Results  Component Value Date   GLUCAP 88 07/25/2021   GLUCAP 115 (H) 07/24/2021   GLUCAP 129 (H) 07/24/2021   GLUCAP 134 (H) 07/24/2021   GLUCAP 149 (H) 07/24/2021     Exercise Target Goals: Exercise Program Goal: Individual exercise prescription set using results from initial 6 min walk test and THRR while considering  patient's activity barriers and safety.   Exercise Prescription Goal: Starting with aerobic activity 30 plus minutes a day, 3 days per week for initial exercise prescription. Provide home exercise prescription and guidelines that participant acknowledges understanding prior to discharge.  Activity Barriers & Risk Stratification:  Activity Barriers & Cardiac Risk Stratification - 09/25/21 1140       Activity Barriers & Cardiac Risk Stratification   Activity Barriers Neck/Spine Problems;Joint Problems;Back Problems;Deconditioning;Balance Concerns;Muscular Weakness    Cardiac Risk Stratification High             6 Minute Walk:  6 Minute Walk     Row Name 09/25/21 1013         6 Minute Walk   Phase Initial     Distance 1188 feet     Walk Time 6 minutes     # of Rest Breaks 0     MPH 2.25     METS 2.02     RPE 11     Perceived Dyspnea  0     VO2 Peak 7.07     Symptoms No     Resting HR 72 bpm     Resting BP 104/60     Resting Oxygen Saturation  100 %     Exercise Oxygen Saturation  during 6 min walk 97 %     Max Ex. HR 94 bpm     Max Ex. BP 126/70     2 Minute Post BP 112/70              Oxygen Initial Assessment:   Oxygen Re-Evaluation:   Oxygen Discharge (Final Oxygen Re-Evaluation):   Initial Exercise Prescription:  Initial Exercise Prescription - 09/25/21 1100       Date of Initial Exercise RX and Referring Provider   Date 09/25/21    Referring Provider Lyman Bishop, MD    Expected Discharge Date 11/23/21      NuStep   Level 2    SPM 75    Minutes 15     METs 2.6      Arm Ergometer   Level 1.5    RPM 50    Minutes 15  METs 2             Perform Capillary Blood Glucose checks as needed.  Exercise Prescription Changes:   Exercise Comments:   Exercise Goals and Review:   Exercise Goals     Row Name 09/25/21 1141             Exercise Goals   Increase Physical Activity Yes       Intervention Provide advice, education, support and counseling about physical activity/exercise needs.;Develop an individualized exercise prescription for aerobic and resistive training based on initial evaluation findings, risk stratification, comorbidities and participant's personal goals.       Expected Outcomes Short Term: Attend rehab on a regular basis to increase amount of physical activity.;Long Term: Add in home exercise to make exercise part of routine and to increase amount of physical activity.;Long Term: Exercising regularly at least 3-5 days a week.       Increase Strength and Stamina Yes       Intervention Provide advice, education, support and counseling about physical activity/exercise needs.;Develop an individualized exercise prescription for aerobic and resistive training based on initial evaluation findings, risk stratification, comorbidities and participant's personal goals.       Expected Outcomes Short Term: Increase workloads from initial exercise prescription for resistance, speed, and METs.;Short Term: Perform resistance training exercises routinely during rehab and add in resistance training at home;Long Term: Improve cardiorespiratory fitness, muscular endurance and strength as measured by increased METs and functional capacity (6MWT)       Able to understand and use rate of perceived exertion (RPE) scale Yes       Intervention Provide education and explanation on how to use RPE scale       Expected Outcomes Short Term: Able to use RPE daily in rehab to express subjective intensity level;Long Term:  Able to use RPE to guide  intensity level when exercising independently       Knowledge and understanding of Target Heart Rate Range (THRR) Yes       Intervention Provide education and explanation of THRR including how the numbers were predicted and where they are located for reference       Expected Outcomes Short Term: Able to state/look up THRR;Long Term: Able to use THRR to govern intensity when exercising independently;Short Term: Able to use daily as guideline for intensity in rehab       Understanding of Exercise Prescription Yes       Intervention Provide education, explanation, and written materials on patient's individual exercise prescription       Expected Outcomes Short Term: Able to explain program exercise prescription;Long Term: Able to explain home exercise prescription to exercise independently                Exercise Goals Re-Evaluation :    Discharge Exercise Prescription (Final Exercise Prescription Changes):   Nutrition:  Target Goals: Understanding of nutrition guidelines, daily intake of sodium 1500mg , cholesterol 200mg , calories 30% from fat and 7% or less from saturated fats, daily to have 5 or more servings of fruits and vegetables.  Biometrics:   Post Biometrics - 09/25/21 0930        Post  Biometrics   Waist Circumference 44 inches    Hip Circumference 47 inches    Waist to Hip Ratio 0.94 %    Triceps Skinfold 15 mm    % Body Fat 31.4 %    Grip Strength 39 kg    Flexibility 11 in  Single Leg Stand 2.37 seconds             Nutrition Therapy Plan and Nutrition Goals:   Nutrition Assessments:  MEDIFICTS Score Key: ?70 Need to make dietary changes  40-70 Heart Healthy Diet ? 40 Therapeutic Level Cholesterol Diet   Picture Your Plate Scores: <68 Unhealthy dietary pattern with much room for improvement. 41-50 Dietary pattern unlikely to meet recommendations for good health and room for improvement. 51-60 More healthful dietary pattern, with some room for  improvement.  >60 Healthy dietary pattern, although there may be some specific behaviors that could be improved.    Nutrition Goals Re-Evaluation:   Nutrition Goals Discharge (Final Nutrition Goals Re-Evaluation):   Psychosocial: Target Goals: Acknowledge presence or absence of significant depression and/or stress, maximize coping skills, provide positive support system. Participant is able to verbalize types and ability to use techniques and skills needed for reducing stress and depression.  Initial Review & Psychosocial Screening:  Initial Psych Review & Screening - 09/25/21 1237       Initial Review   Current issues with None Identified      Family Dynamics   Good Support System? Yes   Arlan has his wife and two children and grand children for support     Barriers   Psychosocial barriers to participate in program There are no identifiable barriers or psychosocial needs.      Screening Interventions   Interventions Encouraged to exercise             Quality of Life Scores:  Quality of Life - 09/25/21 1136       Quality of Life   Select Quality of Life      Quality of Life Scores   Health/Function Pre 23.89 %    Socioeconomic Pre 26.46 %    Psych/Spiritual Pre 27.57 %    Family Pre 28.8 %    GLOBAL Pre 25.96 %            Scores of 19 and below usually indicate a poorer quality of life in these areas.  A difference of  2-3 points is a clinically meaningful difference.  A difference of 2-3 points in the total score of the Quality of Life Index has been associated with significant improvement in overall quality of life, self-image, physical symptoms, and general health in studies assessing change in quality of life.  PHQ-9: Recent Review Flowsheet Data     Depression screen [facility] 2/9 09/25/2021   Decreased Interest 0   Down, Depressed, Hopeless 0   PHQ - 2 Score 0      Interpretation of Total Score  Total Score Depression Severity:  1-4 = Minimal  depression, 5-9 = Mild depression, 10-14 = Moderate depression, 15-19 = Moderately severe depression, 20-27 = Severe depression   Psychosocial Evaluation and Intervention:   Psychosocial Re-Evaluation:   Psychosocial Discharge (Final Psychosocial Re-Evaluation):   Vocational Rehabilitation: Provide vocational rehab assistance to qualifying candidates.   Vocational Rehab Evaluation & Intervention:  Vocational Rehab - 09/25/21 1239       Initial Vocational Rehab Evaluation & Intervention   Assessment shows need for Vocational Rehabilitation No   Kerby is retired and does not need vocational rehab at this time.            Education: Education Goals: Education classes will be provided on a weekly basis, covering required topics. Participant will state understanding/return demonstration of topics presented.  Learning Barriers/Preferences:  Learning Barriers/Preferences - 09/25/21 1138  Learning Barriers/Preferences   Learning Barriers Sight   wears glasses   Learning Preferences Computer/Internet;Pictoral;Video             Education Topics: Hypertension, Hypertension Reduction -Define heart disease and high blood pressure. Discus how high blood pressure affects the body and ways to reduce high blood pressure.   Exercise and Your Heart -Discuss why it is important to exercise, the FITT principles of exercise, normal and abnormal responses to exercise, and how to exercise safely.   Angina -Discuss definition of angina, causes of angina, treatment of angina, and how to decrease risk of having angina.   Cardiac Medications -Review what the following cardiac medications are used for, how they affect the body, and side effects that may occur when taking the medications.  Medications include Aspirin, Beta blockers, calcium channel blockers, ACE Inhibitors, angiotensin receptor blockers, diuretics, digoxin, and antihyperlipidemics.   Congestive Heart  Failure -Discuss the definition of CHF, how to live with CHF, the signs and symptoms of CHF, and how keep track of weight and sodium intake.   Heart Disease and Intimacy -Discus the effect sexual activity has on the heart, how changes occur during intimacy as we age, and safety during sexual activity.   Smoking Cessation / COPD -Discuss different methods to quit smoking, the health benefits of quitting smoking, and the definition of COPD.   Nutrition I: Fats -Discuss the types of cholesterol, what cholesterol does to the heart, and how cholesterol levels can be controlled.   Nutrition II: Labels -Discuss the different components of food labels and how to read food label   Heart Parts/Heart Disease and PAD -Discuss the anatomy of the heart, the pathway of blood circulation through the heart, and these are affected by heart disease.   Stress I: Signs and Symptoms -Discuss the causes of stress, how stress may lead to anxiety and depression, and ways to limit stress.   Stress II: Relaxation -Discuss different types of relaxation techniques to limit stress.   Warning Signs of Stroke / TIA -Discuss definition of a stroke, what the signs and symptoms are of a stroke, and how to identify when someone is having stroke.   Knowledge Questionnaire Score:  Knowledge Questionnaire Score - 09/25/21 1136       Knowledge Questionnaire Score   Pre Score 17/24             Core Components/Risk Factors/Patient Goals at Admission:  Personal Goals and Risk Factors at Admission - 09/25/21 1137       Core Components/Risk Factors/Patient Goals on Admission    Weight Management Yes;Obesity;Weight Loss    Intervention Weight Management: Develop a combined nutrition and exercise program designed to reach desired caloric intake, while maintaining appropriate intake of nutrient and fiber, sodium and fats, and appropriate energy expenditure required for the weight goal.;Weight Management:  Provide education and appropriate resources to help participant work on and attain dietary goals.;Weight Management/Obesity: Establish reasonable short term and long term weight goals.;Obesity: Provide education and appropriate resources to help participant work on and attain dietary goals.    Admit Weight 243 lb 2.7 oz (110.3 kg)    Expected Outcomes Short Term: Continue to assess and modify interventions until short term weight is achieved;Long Term: Adherence to nutrition and physical activity/exercise program aimed toward attainment of established weight goal;Weight Maintenance: Understanding of the daily nutrition guidelines, which includes 25-35% calories from fat, 7% or less cal from saturated fats, less than 200mg  cholesterol, less than 1.5gm of sodium, &  5 or more servings of fruits and vegetables daily;Weight Loss: Understanding of general recommendations for a balanced deficit meal plan, which promotes 1-2 lb weight loss per week and includes a negative energy balance of [phone] kcal/d;Understanding recommendations for meals to include 15-35% energy as protein, 25-35% energy from fat, 35-60% energy from carbohydrates, less than 200mg  of dietary cholesterol, 20-35 gm of total fiber daily;Understanding of distribution of calorie intake throughout the day with the consumption of 4-5 meals/snacks    Diabetes Yes    Intervention Provide education about signs/symptoms and action to take for hypo/hyperglycemia.;Provide education about proper nutrition, including hydration, and aerobic/resistive exercise prescription along with prescribed medications to achieve blood glucose in normal ranges: Fasting glucose 65-99 mg/dL    Expected Outcomes Short Term: Participant verbalizes understanding of the signs/symptoms and immediate care of hyper/hypoglycemia, proper foot care and importance of medication, aerobic/resistive exercise and nutrition plan for blood glucose control.;Long Term: Attainment of HbA1C < 7%.     Hypertension Yes    Intervention Provide education on lifestyle modifcations including regular physical activity/exercise, weight management, moderate sodium restriction and increased consumption of fresh fruit, vegetables, and low fat dairy, alcohol moderation, and smoking cessation.;Monitor prescription use compliance.    Expected Outcomes Short Term: Continued assessment and intervention until BP is < 140/75mm HG in hypertensive participants. < 130/51mm HG in hypertensive participants with diabetes, heart failure or chronic kidney disease.;Long Term: Maintenance of blood pressure at goal levels.    Lipids Yes    Intervention Provide education and support for participant on nutrition & aerobic/resistive exercise along with prescribed medications to achieve LDL 70mg , HDL >40mg .    Expected Outcomes Short Term: Participant states understanding of desired cholesterol values and is compliant with medications prescribed. Participant is following exercise prescription and nutrition guidelines.;Long Term: Cholesterol controlled with medications as prescribed, with individualized exercise RX and with personalized nutrition plan. Value goals: LDL < 70mg , HDL > 40 mg.             Core Components/Risk Factors/Patient Goals Review:    Core Components/Risk Factors/Patient Goals at Discharge (Final Review):    ITP Comments:  ITP Comments     Row Name 09/25/21 1235           ITP Comments Dr Fransico Him MD, Medical Director                Comments: Tavarious attended orientation on 09/25/2021 to review rules and guidelines for program.  Completed 6 minute walk test, Intitial ITP, and exercise prescription.  VSS. Telemetry-Sinus Rhythm .  Asymptomatic. Safety measures and social distancing in place per CDC guidelines. Barnet Pall, RN,BSN 09/25/2021 1:23 PM

## 2021-09-26 ENCOUNTER — Telehealth: Payer: Self-pay | Admitting: Internal Medicine

## 2021-09-26 NOTE — Telephone Encounter (Signed)
Returned call to patient of Dr. Debara Pickett who reports pressure in rib cage when walking - occurred yesterday and Saturday. He had been walking 10-15 minutes when the pressure started. [place]. He said 3/4 the way thru his walk, the symptoms start.. then subside after about 1 minute of rest. He reports he does not get out of breath, just the pressure. No other activities have precipitated these feelings. No rest symptoms.   He had chest pressure in August, which prompted his testing and subsequent CABG based on results He states this is different, in that he does not get out of breath   Asked if chest is sore/tender to touch and he does not endorse tangible discomfort  He has NTG, but has not used  Advised will send to MD to review and will follow up with him

## 2021-09-26 NOTE — Telephone Encounter (Signed)
Patient states he has been walking a lot, but occasionally after walking for 15 minutes he feels pressure in his rib cage. He denies experiencing any pain. States it is not in his chest, but below his chest. He states this has been going on for the past few days. Denies having experienced any additional symptoms and states this only happens when he walks. He would like to know whether or not this feeling is normal. Please advise.

## 2021-10-01 ENCOUNTER — Encounter (HOSPITAL_COMMUNITY)
Admission: RE | Admit: 2021-10-01 | Discharge: 2021-10-01 | Disposition: A | Discharge status: Home / Self Care | Payer: Medicare PPO | Source: Ambulatory Visit | Attending: Internal Medicine | Admitting: Internal Medicine

## 2021-10-01 ENCOUNTER — Other Ambulatory Visit: Payer: Self-pay | Admitting: Thoracic Surgery (Cardiothoracic Vascular Surgery)

## 2021-10-01 ENCOUNTER — Other Ambulatory Visit: Payer: Self-pay

## 2021-10-01 DIAGNOSIS — Z951 Presence of aortocoronary bypass graft: Secondary | ICD-10-CM

## 2021-10-01 DIAGNOSIS — E119 Type 2 diabetes mellitus without complications: Secondary | ICD-10-CM | POA: Diagnosis not present

## 2021-10-01 LAB — GLUCOSE, CAPILLARY
Glucose-Capillary: 91 mg/dL (ref 70–99)
Glucose-Capillary: 72 mg/dL (ref 70–99)
Glucose-Capillary: 96 mg/dL (ref 70–99)

## 2021-10-01 NOTE — Telephone Encounter (Signed)
This does not sound cardiac - maybe musculoskeletal pain with exertion - or could be related to surgery. Would continue to be active and monitor symptoms.  Dr Lemmie Evens

## 2021-10-01 NOTE — Progress Notes (Signed)
Cardiac Individual Treatment Plan  Patient Details  Name: Oscar [name] MRN: [id number] Date of Birth: 1946/05/06 Referring Provider:   Flowsheet Row CARDIAC REHAB PHASE II ORIENTATION from 09/25/2021 in [facility]  Referring Provider Lyman Bishop, MD       Initial Encounter Date:  Flowsheet Row CARDIAC REHAB PHASE II ORIENTATION from 09/25/2021 in [facility]  Date 09/25/21       Visit Diagnosis:  07/16/21 S/P CABG x 5  Patient's Home Medications on Admission:  Current Outpatient Medications:    amiodarone (PACERONE) 200 MG tablet, Take 1 tablet (200 mg total) by mouth daily., Disp: 90 tablet, Rfl: 3   apixaban (ELIQUIS) 5 MG TABS tablet, Take 1 tablet (5 mg total) by mouth 2 (two) times daily., Disp: 180 tablet, Rfl: 1   aspirin EC 81 MG tablet, Take 81 mg by mouth in the morning. Swallow whole., Disp: , Rfl:    atorvastatin (LIPITOR) 80 MG tablet, Take 1 tablet (80 mg total) by mouth daily., Disp: 60 tablet, Rfl: 1   furosemide (LASIX) 20 MG tablet, Take 1 tablet (20 mg total) by mouth daily., Disp: 90 tablet, Rfl: 3   halobetasol (ULTRAVATE) 0.05 % cream, Apply 1 application topically 2 (two) times daily as needed (psoriasis)., Disp: , Rfl:    hydrocortisone 2.5 % cream, Apply topically 2 (two) times daily. (Patient taking differently: Apply 1 application topically 2 (two) times daily as needed (psoriasis).), Disp: 30 g, Rfl: 4   Magnesium 250 MG TABS, Take 250 mg by mouth in the morning., Disp: , Rfl:    metFORMIN (GLUCOPHAGE) 500 MG tablet, Take 500-1,000 mg by mouth See admin instructions. Take 1 tablet (500 mg) by mouth in the morning & take 2 tablets (1000 mg) by mouth in the afternoon., Disp: , Rfl:    metoprolol succinate (TOPROL XL) 25 MG 24 hr tablet, Take 0.5 tablets (12.5 mg total) by mouth at bedtime., Disp: 30 tablet, Rfl: 3   omeprazole (PRILOSEC OTC) 20 MG tablet, Take 20 mg by mouth daily as needed  (indigestion/heartburn)., Disp: , Rfl:    potassium chloride SA (KLOR-CON) 20 MEQ tablet, Take 1 tablet (20 mEq total) by mouth daily. (Patient taking differently: Take 20 mEq by mouth every evening.), Disp: 90 tablet, Rfl: 1   vitamin B-12 (CYANOCOBALAMIN) 500 MCG tablet, Take 500 mcg by mouth in the morning., Disp: , Rfl:  No current facility-administered medications for this encounter.  Facility-Administered Medications Ordered in Other Encounters:    technetium tetrofosmin (TC-MYOVIEW) injection 16.6 millicurie, 06.3 millicurie, Intravenous, Once PRN, Stanford Breed, Denice Bors, MD  Past Medical History: Past Medical History:  Diagnosis Date   Anesthesia complication    PER PT, "HARD TO WAKE' UP PAST SHOULDER SURGERY IN 2016   Arthritis    lumbar, spondylosis, DDD,stenosis    Atrial premature depolarization    Chronic right hip pain    and right leg pain   Coronary artery disease    Diabetes mellitus without complication ([place])    Dysrhythmia    pt. reports that he has been told in the past that he has  irreg. heattbeat    Edema    Gout    bil ankles   Hyperlipidemia    Hypertension    Rotator cuff tear    right   Shortness of breath    Swelling of extremity    bil feet   Type 2 diabetes mellitus with hyperglycemia ([place])  Tobacco Use: Social History   Tobacco Use  Smoking Status Former   Packs/day: 0.30   Years: 10.00   Pack years: 3.00   Types: Cigarettes   Quit date: 11/04/1973   Years since quitting: 47.9  Smokeless Tobacco Never    Labs: Recent Review Flowsheet Data     Labs for ITP Cardiac and Pulmonary Rehab Latest Ref Rng & Units 07/16/2021 07/16/2021 07/16/2021 07/16/2021 07/16/2021   Hemoglobin A1c 4.8 - 5.6 % - - - - -   PHART 7.350 - 7.450 7.370 - 7.345(L) - 7.360   PCO2ART 32.0 - 48.0 mmHg 40.9 - 42.3 - 37.0   HCO3 20.0 - 28.0 mmol/L 23.6 - 23.4 21.2 20.7   TCO2 22 - 32 mmol/L 25 23 25 22 22    ACIDBASEDEF 0.0 - 2.0 mmol/L 2.0 - 3.0(H) 3.0(H) 4.0(H)   O2SAT  % 94.0 - 96.0 98.0 98.0       Capillary Blood Glucose: Lab Results  Component Value Date   GLUCAP 96 10/01/2021   GLUCAP 72 10/01/2021   GLUCAP 91 10/01/2021   GLUCAP 88 07/25/2021   GLUCAP 115 (H) 07/24/2021     Exercise Target Goals: Exercise Program Goal: Individual exercise prescription set using results from initial 6 min walk test and THRR while considering  patient's activity barriers and safety.   Exercise Prescription Goal: Starting with aerobic activity 30 plus minutes a day, 3 days per week for initial exercise prescription. Provide home exercise prescription and guidelines that participant acknowledges understanding prior to discharge.  Activity Barriers & Risk Stratification:  Activity Barriers & Cardiac Risk Stratification - 09/25/21 1140       Activity Barriers & Cardiac Risk Stratification   Activity Barriers Neck/Spine Problems;Joint Problems;Back Problems;Deconditioning;Balance Concerns;Muscular Weakness    Cardiac Risk Stratification High             6 Minute Walk:  6 Minute Walk     Row Name 09/25/21 1013         6 Minute Walk   Phase Initial     Distance 1188 feet     Walk Time 6 minutes     # of Rest Breaks 0     MPH 2.25     METS 2.02     RPE 11     Perceived Dyspnea  0     VO2 Peak 7.07     Symptoms No     Resting HR 72 bpm     Resting BP 104/60     Resting Oxygen Saturation  100 %     Exercise Oxygen Saturation  during 6 min walk 97 %     Max Ex. HR 94 bpm     Max Ex. BP 126/70     2 Minute Post BP 112/70              Oxygen Initial Assessment:   Oxygen Re-Evaluation:   Oxygen Discharge (Final Oxygen Re-Evaluation):   Initial Exercise Prescription:  Initial Exercise Prescription - 09/25/21 1100       Date of Initial Exercise RX and Referring Provider   Date 09/25/21    Referring Provider Lyman Bishop, MD    Expected Discharge Date 11/23/21      NuStep   Level 2    SPM 75    Minutes 15    METs 2.6       Arm Ergometer   Level 1.5    RPM 50    Minutes 15  METs 2             Perform Capillary Blood Glucose checks as needed.  Exercise Prescription Changes:   Exercise Prescription Changes     Row Name 10/01/21 1200             Response to Exercise   Blood Pressure (Admit) 100/60       Blood Pressure (Exercise) 130/78       Blood Pressure (Exit) 104/60       Heart Rate (Admit) 75 bpm       Heart Rate (Exercise) 90 bpm       Heart Rate (Exit) 75 bpm       Rating of Perceived Exertion (Exercise) 11       Symptoms None       Comments Pt's first day in the [facility] program       Duration Continue with 30 min of aerobic exercise without signs/symptoms of physical distress.       Intensity THRR unchanged         Progression   Progression Continue to progress workloads to maintain intensity without signs/symptoms of physical distress.       Average METs 1.8         Resistance Training   Weight 3 lbs       Reps 10-15       Time 10 Minutes         Interval Training   Interval Training No         NuStep   Level 2       SPM 75       Minutes 15         Arm Ergometer   Level 1.5       Watts 13       RPM 52       Minutes 15       METs 1.8                Exercise Comments:   Exercise Comments     Row Name 10/01/21 1213           Exercise Comments Pt's first day in the [facility] program. Pt tolerated initial exercise session well with no complaints.                Exercise Goals and Review:   Exercise Goals     Row Name 09/25/21 1141             Exercise Goals   Increase Physical Activity Yes       Intervention Provide advice, education, support and counseling about physical activity/exercise needs.;Develop an individualized exercise prescription for aerobic and resistive training based on initial evaluation findings, risk stratification, comorbidities and participant's personal goals.       Expected Outcomes Short Term: Attend rehab on a  regular basis to increase amount of physical activity.;Long Term: Add in home exercise to make exercise part of routine and to increase amount of physical activity.;Long Term: Exercising regularly at least 3-5 days a week.       Increase Strength and Stamina Yes       Intervention Provide advice, education, support and counseling about physical activity/exercise needs.;Develop an individualized exercise prescription for aerobic and resistive training based on initial evaluation findings, risk stratification, comorbidities and participant's personal goals.       Expected Outcomes Short Term: Increase workloads from initial exercise prescription for resistance, speed, and METs.;Short Term: Perform resistance training exercises  routinely during rehab and add in resistance training at home;Long Term: Improve cardiorespiratory fitness, muscular endurance and strength as measured by increased METs and functional capacity (6MWT)       Able to understand and use rate of perceived exertion (RPE) scale Yes       Intervention Provide education and explanation on how to use RPE scale       Expected Outcomes Short Term: Able to use RPE daily in rehab to express subjective intensity level;Long Term:  Able to use RPE to guide intensity level when exercising independently       Knowledge and understanding of Target Heart Rate Range (THRR) Yes       Intervention Provide education and explanation of THRR including how the numbers were predicted and where they are located for reference       Expected Outcomes Short Term: Able to state/look up THRR;Long Term: Able to use THRR to govern intensity when exercising independently;Short Term: Able to use daily as guideline for intensity in rehab       Understanding of Exercise Prescription Yes       Intervention Provide education, explanation, and written materials on patient's individual exercise prescription       Expected Outcomes Short Term: Able to explain program exercise  prescription;Long Term: Able to explain home exercise prescription to exercise independently                Exercise Goals Re-Evaluation :  Exercise Goals Re-Evaluation     [place] Name 10/01/21 1212             Exercise Goal Re-Evaluation   Exercise Goals Review Increase Physical Activity;Increase Strength and Stamina;Able to understand and use rate of perceived exertion (RPE) scale;Knowledge and understanding of Target Heart Rate Range (THRR);Understanding of Exercise Prescription       Comments Pt's first day in the [facility] program. Pt understnads the exercise Rx, THRR, and RPE scale.       Expected Outcomes Will continue to monitor patient and progress exercise workloads as tolerated.                 Discharge Exercise Prescription (Final Exercise Prescription Changes):  Exercise Prescription Changes - 10/01/21 1200       Response to Exercise   Blood Pressure (Admit) 100/60    Blood Pressure (Exercise) 130/78    Blood Pressure (Exit) 104/60    Heart Rate (Admit) 75 bpm    Heart Rate (Exercise) 90 bpm    Heart Rate (Exit) 75 bpm    Rating of Perceived Exertion (Exercise) 11    Symptoms None    Comments Pt's first day in the [facility] program    Duration Continue with 30 min of aerobic exercise without signs/symptoms of physical distress.    Intensity THRR unchanged      Progression   Progression Continue to progress workloads to maintain intensity without signs/symptoms of physical distress.    Average METs 1.8      Resistance Training   Weight 3 lbs    Reps 10-15    Time 10 Minutes      Interval Training   Interval Training No      NuStep   Level 2    SPM 75    Minutes 15      Arm Ergometer   Level 1.5    Watts 13    RPM 52    Minutes 15    METs 1.8  Nutrition:  Target Goals: Understanding of nutrition guidelines, daily intake of sodium 1500mg , cholesterol 200mg , calories 30% from fat and 7% or less from saturated fats, daily to have 5  or more servings of fruits and vegetables.  Biometrics:   Post Biometrics - 09/25/21 0930        Post  Biometrics   Waist Circumference 44 inches    Hip Circumference 47 inches    Waist to Hip Ratio 0.94 %    Triceps Skinfold 15 mm    % Body Fat 31.4 %    Grip Strength 39 kg    Flexibility 11 in    Single Leg Stand 2.37 seconds             Nutrition Therapy Plan and Nutrition Goals:   Nutrition Assessments:  MEDIFICTS Score Key: ?70 Need to make dietary changes  40-70 Heart Healthy Diet ? 40 Therapeutic Level Cholesterol Diet   Picture Your Plate Scores: <78 Unhealthy dietary pattern with much room for improvement. 41-50 Dietary pattern unlikely to meet recommendations for good health and room for improvement. 51-60 More healthful dietary pattern, with some room for improvement.  >60 Healthy dietary pattern, although there may be some specific behaviors that could be improved.    Nutrition Goals Re-Evaluation:   Nutrition Goals Discharge (Final Nutrition Goals Re-Evaluation):   Psychosocial: Target Goals: Acknowledge presence or absence of significant depression and/or stress, maximize coping skills, provide positive support system. Participant is able to verbalize types and ability to use techniques and skills needed for reducing stress and depression.  Initial Review & Psychosocial Screening:  Initial Psych Review & Screening - 09/25/21 1237       Initial Review   Current issues with None Identified      Family Dynamics   Good Support System? Yes   Dmani has his wife and two children and grand children for support     Barriers   Psychosocial barriers to participate in program There are no identifiable barriers or psychosocial needs.      Screening Interventions   Interventions Encouraged to exercise             Quality of Life Scores:  Quality of Life - 09/25/21 1136       Quality of Life   Select Quality of Life      Quality of Life  Scores   Health/Function Pre 23.89 %    Socioeconomic Pre 26.46 %    Psych/Spiritual Pre 27.57 %    Family Pre 28.8 %    GLOBAL Pre 25.96 %            Scores of 19 and below usually indicate a poorer quality of life in these areas.  A difference of  2-3 points is a clinically meaningful difference.  A difference of 2-3 points in the total score of the Quality of Life Index has been associated with significant improvement in overall quality of life, self-image, physical symptoms, and general health in studies assessing change in quality of life.  PHQ-9: Recent Review Flowsheet Data     Depression screen [facility] 2/9 09/25/2021   Decreased Interest 0   Down, Depressed, Hopeless 0   PHQ - 2 Score 0      Interpretation of Total Score  Total Score Depression Severity:  1-4 = Minimal depression, 5-9 = Mild depression, 10-14 = Moderate depression, 15-19 = Moderately severe depression, 20-27 = Severe depression   Psychosocial Evaluation and Intervention:   Psychosocial Re-Evaluation:  Psychosocial Re-Evaluation     Row Name 10/01/21 1626             Psychosocial Re-Evaluation   Current issues with None Identified       Interventions Encouraged to attend Cardiac Rehabilitation for the exercise       Continue Psychosocial Services  No Follow up required                Psychosocial Discharge (Final Psychosocial Re-Evaluation):  Psychosocial Re-Evaluation - 10/01/21 1626       Psychosocial Re-Evaluation   Current issues with None Identified    Interventions Encouraged to attend Cardiac Rehabilitation for the exercise    Continue Psychosocial Services  No Follow up required             Vocational Rehabilitation: Provide vocational rehab assistance to qualifying candidates.   Vocational Rehab Evaluation & Intervention:  Vocational Rehab - 09/25/21 1239       Initial Vocational Rehab Evaluation & Intervention   Assessment shows need for Vocational Rehabilitation  No   Malakhi is retired and does not need vocational rehab at this time.            Education: Education Goals: Education classes will be provided on a weekly basis, covering required topics. Participant will state understanding/return demonstration of topics presented.  Learning Barriers/Preferences:  Learning Barriers/Preferences - 09/25/21 1138       Learning Barriers/Preferences   Learning Barriers Sight   wears glasses   Learning Preferences Computer/Internet;Pictoral;Video             Education Topics: Hypertension, Hypertension Reduction -Define heart disease and high blood pressure. Discus how high blood pressure affects the body and ways to reduce high blood pressure.   Exercise and Your Heart -Discuss why it is important to exercise, the FITT principles of exercise, normal and abnormal responses to exercise, and how to exercise safely.   Angina -Discuss definition of angina, causes of angina, treatment of angina, and how to decrease risk of having angina.   Cardiac Medications -Review what the following cardiac medications are used for, how they affect the body, and side effects that may occur when taking the medications.  Medications include Aspirin, Beta blockers, calcium channel blockers, ACE Inhibitors, angiotensin receptor blockers, diuretics, digoxin, and antihyperlipidemics.   Congestive Heart Failure -Discuss the definition of CHF, how to live with CHF, the signs and symptoms of CHF, and how keep track of weight and sodium intake.   Heart Disease and Intimacy -Discus the effect sexual activity has on the heart, how changes occur during intimacy as we age, and safety during sexual activity.   Smoking Cessation / COPD -Discuss different methods to quit smoking, the health benefits of quitting smoking, and the definition of COPD.   Nutrition I: Fats -Discuss the types of cholesterol, what cholesterol does to the heart, and how cholesterol levels  can be controlled.   Nutrition II: Labels -Discuss the different components of food labels and how to read food label   Heart Parts/Heart Disease and PAD -Discuss the anatomy of the heart, the pathway of blood circulation through the heart, and these are affected by heart disease.   Stress I: Signs and Symptoms -Discuss the causes of stress, how stress may lead to anxiety and depression, and ways to limit stress.   Stress II: Relaxation -Discuss different types of relaxation techniques to limit stress.   Warning Signs of Stroke / TIA -Discuss definition of a stroke, what the signs  and symptoms are of a stroke, and how to identify when someone is having stroke.   Knowledge Questionnaire Score:  Knowledge Questionnaire Score - 09/25/21 1136       Knowledge Questionnaire Score   Pre Score 17/24             Core Components/Risk Factors/Patient Goals at Admission:  Personal Goals and Risk Factors at Admission - 09/25/21 1137       Core Components/Risk Factors/Patient Goals on Admission    Weight Management Yes;Obesity;Weight Loss    Intervention Weight Management: Develop a combined nutrition and exercise program designed to reach desired caloric intake, while maintaining appropriate intake of nutrient and fiber, sodium and fats, and appropriate energy expenditure required for the weight goal.;Weight Management: Provide education and appropriate resources to help participant work on and attain dietary goals.;Weight Management/Obesity: Establish reasonable short term and long term weight goals.;Obesity: Provide education and appropriate resources to help participant work on and attain dietary goals.    Admit Weight 243 lb 2.7 oz (110.3 kg)    Expected Outcomes Short Term: Continue to assess and modify interventions until short term weight is achieved;Long Term: Adherence to nutrition and physical activity/exercise program aimed toward attainment of established weight goal;Weight  Maintenance: Understanding of the daily nutrition guidelines, which includes 25-35% calories from fat, 7% or less cal from saturated fats, less than 200mg  cholesterol, less than 1.5gm of sodium, & 5 or more servings of fruits and vegetables daily;Weight Loss: Understanding of general recommendations for a balanced deficit meal plan, which promotes 1-2 lb weight loss per week and includes a negative energy balance of [phone] kcal/d;Understanding recommendations for meals to include 15-35% energy as protein, 25-35% energy from fat, 35-60% energy from carbohydrates, less than 200mg  of dietary cholesterol, 20-35 gm of total fiber daily;Understanding of distribution of calorie intake throughout the day with the consumption of 4-5 meals/snacks    Diabetes Yes    Intervention Provide education about signs/symptoms and action to take for hypo/hyperglycemia.;Provide education about proper nutrition, including hydration, and aerobic/resistive exercise prescription along with prescribed medications to achieve blood glucose in normal ranges: Fasting glucose 65-99 mg/dL    Expected Outcomes Short Term: Participant verbalizes understanding of the signs/symptoms and immediate care of hyper/hypoglycemia, proper foot care and importance of medication, aerobic/resistive exercise and nutrition plan for blood glucose control.;Long Term: Attainment of HbA1C < 7%.    Hypertension Yes    Intervention Provide education on lifestyle modifcations including regular physical activity/exercise, weight management, moderate sodium restriction and increased consumption of fresh fruit, vegetables, and low fat dairy, alcohol moderation, and smoking cessation.;Monitor prescription use compliance.    Expected Outcomes Short Term: Continued assessment and intervention until BP is < 140/58mm HG in hypertensive participants. < 130/54mm HG in hypertensive participants with diabetes, heart failure or chronic kidney disease.;Long Term: Maintenance of  blood pressure at goal levels.    Lipids Yes    Intervention Provide education and support for participant on nutrition & aerobic/resistive exercise along with prescribed medications to achieve LDL 70mg , HDL >40mg .    Expected Outcomes Short Term: Participant states understanding of desired cholesterol values and is compliant with medications prescribed. Participant is following exercise prescription and nutrition guidelines.;Long Term: Cholesterol controlled with medications as prescribed, with individualized exercise RX and with personalized nutrition plan. Value goals: LDL < 70mg , HDL > 40 mg.             Core Components/Risk Factors/Patient Goals Review:   Goals and Risk Factor Review  [place] Name 10/01/21 1627             Core Components/Risk Factors/Patient Goals Review   Personal Goals Review Weight Management/Obesity;Hypertension;Lipids;Diabetes       Review Cyruss started exercise at [facility] on 10/01/21. Vital signs were stable. CBG's WNL with an erroneous reading of 72.       Expected Outcomes Trelyn will continue to participate in phase 2 cardiac rehab for exercise, nutrition and lifestyle modifications.                Core Components/Risk Factors/Patient Goals at Discharge (Final Review):   Goals and Risk Factor Review - 10/01/21 1627       Core Components/Risk Factors/Patient Goals Review   Personal Goals Review Weight Management/Obesity;Hypertension;Lipids;Diabetes    Review Miller started exercise at [facility] on 10/01/21. Vital signs were stable. CBG's WNL with an erroneous reading of 72.    Expected Outcomes Shady will continue to participate in phase 2 cardiac rehab for exercise, nutrition and lifestyle modifications.             ITP Comments:  ITP Comments     Row Name 09/25/21 1235 10/01/21 1623         ITP Comments Dr Fransico Him MD, Medical Director 30 Day ITP Review. Ernan started cardiac rehab on 10/01/21 and did well with exercise.                Comments:See ITP comments.Barnet Pall, RN,BSN 10/02/2021 3:58 PM

## 2021-10-01 NOTE — Progress Notes (Signed)
Daily Session Note  Patient Details  Name: Oscar [name] MRN: [id number] Date of Birth: [birth year] Referring Provider:   Flowsheet Row CARDIAC REHAB PHASE II ORIENTATION from 09/25/2021 in [place]  Referring Provider Oscar Bishop, MD       Encounter Date: 10/01/2021  Check In:  Session Check In - 10/01/21 0838       Check-In   Supervising physician immediately available to respond to emergencies [facility] immediately available    Physician(s) Dr Oscar [name]    Location [facility]    Staff Present Barnet Pall, RN, Milus Glazier, MS, ACSM-CEP, CCRP, Exercise Physiologist;Olinty Celesta Aver, MS, ACSM CEP, Exercise Physiologist;Jetta Gilford Rile BS, ACSM EP-C, Exercise Physiologist;Annedrea Rosezella Florida, RN, Fernande Bras, MS, ACSM-CEP, Exercise Physiologist    Virtual Visit No    Medication changes reported     No    Fall or balance concerns reported    No    Tobacco Cessation No Change    Current number of cigarettes/nicotine per day     0    Warm-up and Cool-down Performed as group-led instruction    Resistance Training Performed Yes    VAD Patient? No    PAD/SET Patient? No      Pain Assessment   Currently in Pain? No/denies    Pain Score 0-No pain    Multiple Pain Sites No             Capillary Blood Glucose: Results for orders placed or performed during the hospital encounter of 10/01/21 (from the past 24 hour(s))  Glucose, capillary     Status: None   Collection Time: 10/01/21  8:29 AM  Result Value Ref Range   Glucose-Capillary 91 70 - 99 mg/dL  Glucose, capillary     Status: None   Collection Time: 10/01/21  9:18 AM  Result Value Ref Range   Glucose-Capillary 72 70 - 99 mg/dL   Comment 1 Repeat Test   Glucose, capillary     Status: None   Collection Time: 10/01/21  9:21 AM  Result Value Ref Range   Glucose-Capillary 96 70 - 99 mg/dL     Exercise Prescription Changes - 10/01/21 1200        Response to Exercise   Blood Pressure (Admit) 100/60    Blood Pressure (Exercise) 130/78    Blood Pressure (Exit) 104/60    Heart Rate (Admit) 75 bpm    Heart Rate (Exercise) 90 bpm    Heart Rate (Exit) 75 bpm    Rating of Perceived Exertion (Exercise) 11    Symptoms None    Comments Pt's first day in the CRP2 program    Duration Continue with 30 min of aerobic exercise without signs/symptoms of physical distress.    Intensity THRR unchanged      Progression   Progression Continue to progress workloads to maintain intensity without signs/symptoms of physical distress.    Average METs 1.8      Resistance Training   Weight 3 lbs    Reps 10-15    Time 10 Minutes      Interval Training   Interval Training No      NuStep   Level 2    SPM 75    Minutes 15      Arm Ergometer   Level 1.5    Watts 13    RPM 52    Minutes 15    METs 1.8  Social History   Tobacco Use  Smoking Status Former   Packs/day: 0.30   Years: 10.00   Pack years: 3.00   Types: Cigarettes   Quit date: 11/04/1973   Years since quitting: 47.9  Smokeless Tobacco Never    Goals Met:  Exercise tolerated well No report of concerns or symptoms today Strength training completed today  Goals Unmet:  Not Applicable  Comments: Pt started cardiac rehab today.  Pt tolerated light exercise without difficulty. VSS, telemetry-Sinus Rhythm, asymptomatic.  Medication list reconciled. Pt denies barriers to medicaiton compliance.  PSYCHOSOCIAL ASSESSMENT:  PHQ-0. Pt exhibits positive coping skills, hopeful outlook with supportive family. No psychosocial needs identified at this time, no psychosocial interventions necessary.    Pt enjoys Golf.   Pt oriented to exercise equipment and routine.    Understanding verbalized. Oscar [name] ate a banana driving into cardiac rehab. Patient was given graham crackers and peanut butter. Post exercise CBG was 72. Oscar [name] fingers were cold the test was repeated with a result  of 96.Oscar [name] was reminded to eat a carbohydrate and a protein prior to coming to class on Wednesday. Patient states understanding.Barnet Pall, RN,BSN 10/01/2021 4:23 PM    Dr. Fransico [name] is Medical Director for Cardiac Rehab at [facility].

## 2021-10-02 ENCOUNTER — Ambulatory Visit
Admission: RE | Admit: 2021-10-02 | Discharge: 2021-10-02 | Disposition: A | Discharge status: Home / Self Care | Payer: Medicare PPO | Source: Ambulatory Visit | Attending: Thoracic Surgery (Cardiothoracic Vascular Surgery) | Admitting: Thoracic Surgery (Cardiothoracic Vascular Surgery)

## 2021-10-02 ENCOUNTER — Ambulatory Visit (INDEPENDENT_AMBULATORY_CARE_PROVIDER_SITE_OTHER): Payer: Self-pay | Admitting: Thoracic Surgery (Cardiothoracic Vascular Surgery)

## 2021-10-02 ENCOUNTER — Encounter: Payer: Self-pay | Admitting: Thoracic Surgery (Cardiothoracic Vascular Surgery)

## 2021-10-02 VITALS — BP 112/74 | HR 78 | Resp 20 | Ht 72.0 in | Wt 244.0 lb

## 2021-10-02 DIAGNOSIS — Z951 Presence of aortocoronary bypass graft: Secondary | ICD-10-CM

## 2021-10-02 DIAGNOSIS — J9811 Atelectasis: Secondary | ICD-10-CM | POA: Diagnosis not present

## 2021-10-02 DIAGNOSIS — J90 Pleural effusion, not elsewhere classified: Secondary | ICD-10-CM | POA: Diagnosis not present

## 2021-10-02 DIAGNOSIS — R918 Other nonspecific abnormal finding of lung field: Secondary | ICD-10-CM | POA: Diagnosis not present

## 2021-10-02 NOTE — Progress Notes (Signed)
[address]       [place] [address]             [phone]     HPI: Oscar [name] returns for follow-up of his postoperative left pleural effusion  Oscar [name] is a 75 year old [name] with a history of obesity, hyperlipidemia, hypertension, and type 2 diabetes.  He presented with palpitations and shortness of breath.  He was found to have three-vessel disease.  I did coronary bypass grafting x5 on 07/16/2021.  He had diffusely diseased targets.  Postoperatively he had atrial fibrillation but went home in sinus rhythm on amiodarone and Eliquis.  On follow-up but chest x-ray he had a left pleural effusion.  I saw him on 09/11/2021 and there was a small left pleural effusion.  It was loculated posterior laterally.  I gave him a prednisone taper.  He now returns for follow-up.  Says overall he is been feeling well.  He started cardiac rehab.  He did have an episode of discomfort while walking at a brisk pace recently.  It was transient and has not recurred.  Past Medical History:  Diagnosis Date   Anesthesia complication    PER PT, "HARD TO WAKE' UP PAST SHOULDER SURGERY IN 2016   Arthritis    lumbar, spondylosis, DDD,stenosis    Atrial premature depolarization    Chronic right hip pain    and right leg pain   Coronary artery disease    Diabetes mellitus without complication (HCC)    Dysrhythmia    pt. reports that he has been told in the past that he has  irreg. heattbeat    Edema    Gout    bil ankles   Hyperlipidemia    Hypertension    Rotator cuff tear    right   Shortness of breath    Swelling of extremity    bil feet   Type 2 diabetes mellitus with hyperglycemia (HCC)     Current Outpatient Medications  Medication Sig Dispense Refill   amiodarone (PACERONE) 200 MG tablet Take 1 tablet (200 mg total) by mouth daily. 90 tablet 3   apixaban (ELIQUIS) 5 MG TABS tablet Take 1 tablet (5 mg total) by mouth 2 (two) times daily. 180 tablet 1   aspirin EC 81 MG  tablet Take 81 mg by mouth in the morning. Swallow whole.     atorvastatin (LIPITOR) 80 MG tablet Take 1 tablet (80 mg total) by mouth daily. 60 tablet 1   furosemide (LASIX) 20 MG tablet Take 1 tablet (20 mg total) by mouth daily. 90 tablet 3   halobetasol (ULTRAVATE) 0.05 % cream Apply 1 application topically 2 (two) times daily as needed (psoriasis).     hydrocortisone 2.5 % cream Apply topically 2 (two) times daily. (Patient taking differently: Apply 1 application topically 2 (two) times daily as needed (psoriasis).) 30 g 4   Magnesium 250 MG TABS Take 250 mg by mouth in the morning.     metFORMIN (GLUCOPHAGE) 500 MG tablet Take 500-1,000 mg by mouth See admin instructions. Take 1 tablet (500 mg) by mouth in the morning & take 2 tablets (1000 mg) by mouth in the afternoon.     metoprolol succinate (TOPROL XL) 25 MG 24 hr tablet Take 0.5 tablets (12.5 mg total) by mouth at bedtime. 30 tablet 3   omeprazole (PRILOSEC OTC) 20 MG tablet Take 20 mg by mouth daily as needed (indigestion/heartburn).     potassium chloride  SA (KLOR-CON) 20 MEQ tablet Take 1 tablet (20 mEq total) by mouth daily. (Patient taking differently: Take 20 mEq by mouth every evening.) 90 tablet 1   vitamin B-12 (CYANOCOBALAMIN) 500 MCG tablet Take 500 mcg by mouth in the morning.     No current facility-administered medications for this visit.   Facility-Administered Medications Ordered in Other Visits  Medication Dose Route Frequency Provider Last Rate Last Admin   technetium tetrofosmin (TC-MYOVIEW) injection 16.1 millicurie  09.6 millicurie Intravenous Once PRN Lelon Perla, MD        Physical Exam BP 112/74   Pulse 78   Resp 20   Ht 6' (1.829 m)   Wt 244 lb (110.7 kg)   SpO2 97% Comment: RA  BMI 33.23 kg/m  Oscar [name] in no acute distress Alert and oriented x3 with no focal deficits Lungs clear with essentially equal breath sounds bilaterally Sternum stable, incision healing well Cardiac regular rate  and rhythm  Diagnostic Tests: I personally reviewed his chest x-ray.  There is a small residual left pleural effusion.  Impression: Oscar [name] is a 75 year old [name] with a history of obesity, hyperlipidemia, hypertension, and type 2 diabetes.  He had CABG x 5 on 07/16/2021.  He had some atrial fibrillation postoperatively.  Status post CABG-has started cardiac rehab.  Exercise tolerance is improving.  Left pleural effusion postop-smaller than his previous film from 3 weeks ago.  There is some residual loculated fluid but there is not enough to warrant intervention with thoracentesis or medications.  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  He is free to resume that as of October 13, 2021  Plan: Follow-up with Dr. Debara Pickett I will be happy to see Oscar [name] back anytime in the future if I can be of any further assistance with his care  Melrose Nakayama, MD [facility] [phone]

## 2021-10-02 NOTE — Telephone Encounter (Signed)
Spoke with patient and relayed MD advice. He does think he is having more MSK pain. He reports he had tried to increase his pace a bit and symptoms started and is doing better at a slower pace. Advised to monitor and if symptoms persist or worsen, let us know

## 2021-10-03 ENCOUNTER — Other Ambulatory Visit: Payer: Self-pay

## 2021-10-03 ENCOUNTER — Encounter (HOSPITAL_COMMUNITY)
Admission: RE | Admit: 2021-10-03 | Discharge: 2021-10-03 | Disposition: A | Discharge status: Home / Self Care | Payer: Medicare PPO | Source: Ambulatory Visit | Attending: Internal Medicine | Admitting: Internal Medicine

## 2021-10-03 DIAGNOSIS — E119 Type 2 diabetes mellitus without complications: Secondary | ICD-10-CM | POA: Diagnosis not present

## 2021-10-03 DIAGNOSIS — Z951 Presence of aortocoronary bypass graft: Secondary | ICD-10-CM

## 2021-10-03 LAB — GLUCOSE, CAPILLARY
Glucose-Capillary: 150 mg/dL — ABNORMAL HIGH (ref 70–99)
Glucose-Capillary: 119 mg/dL — ABNORMAL HIGH (ref 70–99)

## 2021-10-05 ENCOUNTER — Encounter (HOSPITAL_COMMUNITY)
Admission: RE | Admit: 2021-10-05 | Discharge: 2021-10-05 | Disposition: A | Discharge status: Home / Self Care | Payer: Medicare PPO | Source: Ambulatory Visit | Attending: Internal Medicine | Admitting: Internal Medicine

## 2021-10-05 ENCOUNTER — Other Ambulatory Visit: Payer: Self-pay

## 2021-10-05 DIAGNOSIS — Z79899 Other long term (current) drug therapy: Secondary | ICD-10-CM | POA: Diagnosis not present

## 2021-10-05 DIAGNOSIS — Z951 Presence of aortocoronary bypass graft: Secondary | ICD-10-CM | POA: Diagnosis not present

## 2021-10-05 LAB — GLUCOSE, CAPILLARY
Glucose-Capillary: 78 mg/dL (ref 70–99)
Glucose-Capillary: 183 mg/dL — ABNORMAL HIGH (ref 70–99)

## 2021-10-08 ENCOUNTER — Encounter (HOSPITAL_COMMUNITY)
Admission: RE | Admit: 2021-10-08 | Discharge: 2021-10-08 | Disposition: A | Discharge status: Home / Self Care | Payer: Medicare PPO | Source: Ambulatory Visit | Attending: Internal Medicine | Admitting: Internal Medicine

## 2021-10-08 ENCOUNTER — Other Ambulatory Visit: Payer: Self-pay

## 2021-10-08 DIAGNOSIS — Z951 Presence of aortocoronary bypass graft: Secondary | ICD-10-CM | POA: Diagnosis not present

## 2021-10-08 DIAGNOSIS — Z79899 Other long term (current) drug therapy: Secondary | ICD-10-CM | POA: Diagnosis not present

## 2021-10-08 LAB — GLUCOSE, CAPILLARY: Glucose-Capillary: 162 mg/dL — ABNORMAL HIGH (ref 70–99)

## 2021-10-10 ENCOUNTER — Other Ambulatory Visit: Payer: Self-pay

## 2021-10-10 ENCOUNTER — Encounter (HOSPITAL_COMMUNITY)
Admission: RE | Admit: 2021-10-10 | Discharge: 2021-10-10 | Disposition: A | Discharge status: Home / Self Care | Payer: Medicare PPO | Source: Ambulatory Visit | Attending: Internal Medicine | Admitting: Internal Medicine

## 2021-10-10 DIAGNOSIS — Z951 Presence of aortocoronary bypass graft: Secondary | ICD-10-CM

## 2021-10-10 DIAGNOSIS — Z79899 Other long term (current) drug therapy: Secondary | ICD-10-CM | POA: Diagnosis not present

## 2021-10-12 ENCOUNTER — Other Ambulatory Visit: Payer: Self-pay

## 2021-10-12 ENCOUNTER — Encounter (HOSPITAL_COMMUNITY)
Admission: RE | Admit: 2021-10-12 | Discharge: 2021-10-12 | Disposition: A | Discharge status: Home / Self Care | Payer: Medicare PPO | Source: Ambulatory Visit | Attending: Internal Medicine | Admitting: Internal Medicine

## 2021-10-12 DIAGNOSIS — Z79899 Other long term (current) drug therapy: Secondary | ICD-10-CM | POA: Diagnosis not present

## 2021-10-12 DIAGNOSIS — Z951 Presence of aortocoronary bypass graft: Secondary | ICD-10-CM

## 2021-10-12 LAB — GLUCOSE, CAPILLARY: Glucose-Capillary: 198 mg/dL — ABNORMAL HIGH (ref 70–99)

## 2021-10-15 ENCOUNTER — Encounter (HOSPITAL_COMMUNITY)
Admission: RE | Admit: 2021-10-15 | Discharge: 2021-10-15 | Disposition: A | Discharge status: Home / Self Care | Payer: Medicare PPO | Source: Ambulatory Visit | Attending: Internal Medicine | Admitting: Internal Medicine

## 2021-10-15 ENCOUNTER — Other Ambulatory Visit: Payer: Self-pay

## 2021-10-15 DIAGNOSIS — Z951 Presence of aortocoronary bypass graft: Secondary | ICD-10-CM | POA: Diagnosis not present

## 2021-10-15 DIAGNOSIS — Z79899 Other long term (current) drug therapy: Secondary | ICD-10-CM | POA: Diagnosis not present

## 2021-10-17 ENCOUNTER — Encounter (HOSPITAL_COMMUNITY)
Admission: RE | Admit: 2021-10-17 | Discharge: 2021-10-17 | Disposition: A | Discharge status: Home / Self Care | Payer: Medicare PPO | Source: Ambulatory Visit | Attending: Internal Medicine | Admitting: Internal Medicine

## 2021-10-17 ENCOUNTER — Other Ambulatory Visit: Payer: Self-pay

## 2021-10-17 DIAGNOSIS — Z951 Presence of aortocoronary bypass graft: Secondary | ICD-10-CM | POA: Diagnosis not present

## 2021-10-17 DIAGNOSIS — Z79899 Other long term (current) drug therapy: Secondary | ICD-10-CM | POA: Diagnosis not present

## 2021-10-19 ENCOUNTER — Telehealth: Payer: Self-pay | Admitting: Cardiology

## 2021-10-19 ENCOUNTER — Other Ambulatory Visit: Payer: Self-pay

## 2021-10-19 ENCOUNTER — Encounter (HOSPITAL_COMMUNITY)
Admission: RE | Admit: 2021-10-19 | Discharge: 2021-10-19 | Disposition: A | Discharge status: Home / Self Care | Payer: Medicare PPO | Source: Ambulatory Visit | Attending: Internal Medicine | Admitting: Internal Medicine

## 2021-10-19 DIAGNOSIS — Z951 Presence of aortocoronary bypass graft: Secondary | ICD-10-CM | POA: Diagnosis not present

## 2021-10-19 DIAGNOSIS — Z79899 Other long term (current) drug therapy: Secondary | ICD-10-CM | POA: Diagnosis not present

## 2021-10-19 NOTE — Progress Notes (Signed)
Oscar [name] is concerned that he has gained weight since starting cardiac rehab on 09/25/21. Denies shortness of breath. Upon assessment Oxygen saturation 98% on room air. Trace lower extremity edema noted above Oscar [name]'s socks greater on the left. Oscar [name] said that he had chicken pot pie from [employer] last night and ate cheerios for breakfast this morning. Upon assessment lung fields diminished left posterior base otherwise lung fields clear upon ascultation. Weight is 113.0 kg which is up .08 kg from Monday. Oscar [name]'s weight was 110.3 kg at orientation on 09/25/21. Oscar Kicks NP paged and notified about today's assessment and the patient's concern. Oscar [name] talked with Oscar [name] over the phone and instructed him to increase his furosemide to 2 tablets daily Saturday and Sunday. Patient states understanding and plans to return to exercise on Monday.Harrell Gave RN BSN

## 2021-10-19 NOTE — Telephone Encounter (Signed)
Cardiac rehab called, pt has some swelling and wt gain.  He has had some salt intake and we discussed.  Decreasing salt.  He will take 20 mg extra lasix today sat and Sunday.  He has cardiac rehab on Monday so we will see how he is doing.  Consider BMP at that point.

## 2021-10-19 NOTE — Progress Notes (Signed)
Reviewed home exercise Rx with patient today. Encouraged warm-up, cool-down, and stretching. Reviewed THRR of 58-116 and keeping RPE between 11-13. Pt encouraged to hydrate with activity. Discussed weather parameters for temperature and humidity for safe exercise outdoors. Reviewed S/S that would require pt to terminate exercise and when to call MD vs 911. Reviewed use of NTG and encouraged to carry at all times. Pt verbalized understanding of the home exercise Rx and was provided a copy.  Lesly Rubenstein MS, ACSM-CEP, CCRP

## 2021-10-22 ENCOUNTER — Encounter (HOSPITAL_COMMUNITY)
Admission: RE | Admit: 2021-10-22 | Discharge: 2021-10-22 | Disposition: A | Discharge status: Home / Self Care | Payer: Medicare PPO | Source: Ambulatory Visit | Attending: Internal Medicine | Admitting: Internal Medicine

## 2021-10-22 ENCOUNTER — Other Ambulatory Visit: Payer: Self-pay

## 2021-10-22 DIAGNOSIS — Z951 Presence of aortocoronary bypass graft: Secondary | ICD-10-CM | POA: Diagnosis not present

## 2021-10-22 DIAGNOSIS — Z79899 Other long term (current) drug therapy: Secondary | ICD-10-CM | POA: Diagnosis not present

## 2021-10-22 NOTE — Progress Notes (Signed)
Oscar [name]'s weight today is 110.2 kg which is down 2.8 Kg from Friday. Upon assessment lung fields clear upon ascultation.Blood pressure 110/74. Oxygen saturation 100% on room air. Decreased lower extremity edema noted. [name] paged and notified about the update per [name] NP. No complaints during exercise today. Will continue to monitor the patient throughout  the program.[name] [name], RN,BSN 10/22/2021 11:58 AM

## 2021-10-22 NOTE — Telephone Encounter (Signed)
Received call from [facility] of cardiac rehab. Today's weight is 110.2, which is 3 kg down from last Friday after a dose of lasix. Lung sounds better. Still trace of LLE but improved.

## 2021-10-23 NOTE — Progress Notes (Signed)
Cardiac Individual Treatment Plan  Patient Details  Name: Oscar [name] MRN: [id number] Date of Birth: Oct 24, 1946 Referring Provider:   Flowsheet Row CARDIAC REHAB PHASE II ORIENTATION from 09/25/2021 in [facility]  Referring Provider Lyman Bishop, MD       Initial Encounter Date:  Flowsheet Row CARDIAC REHAB PHASE II ORIENTATION from 09/25/2021 in [facility]  Date 09/25/21       Visit Diagnosis:  07/16/21 S/P CABG x 5  Patient's Home Medications on Admission:  Current Outpatient Medications:    amiodarone (PACERONE) 200 MG tablet, Take 1 tablet (200 mg total) by mouth daily., Disp: 90 tablet, Rfl: 3   apixaban (ELIQUIS) 5 MG TABS tablet, Take 1 tablet (5 mg total) by mouth 2 (two) times daily., Disp: 180 tablet, Rfl: 1   aspirin EC 81 MG tablet, Take 81 mg by mouth in the morning. Swallow whole., Disp: , Rfl:    atorvastatin (LIPITOR) 80 MG tablet, Take 1 tablet (80 mg total) by mouth daily., Disp: 60 tablet, Rfl: 1   furosemide (LASIX) 20 MG tablet, Take 1 tablet (20 mg total) by mouth daily., Disp: 90 tablet, Rfl: 3   halobetasol (ULTRAVATE) 0.05 % cream, Apply 1 application topically 2 (two) times daily as needed (psoriasis)., Disp: , Rfl:    hydrocortisone 2.5 % cream, Apply topically 2 (two) times daily. (Patient taking differently: Apply 1 application topically 2 (two) times daily as needed (psoriasis).), Disp: 30 g, Rfl: 4   Magnesium 250 MG TABS, Take 250 mg by mouth in the morning., Disp: , Rfl:    metFORMIN (GLUCOPHAGE) 500 MG tablet, Take 500-1,000 mg by mouth See admin instructions. Take 1 tablet (500 mg) by mouth in the morning & take 2 tablets (1000 mg) by mouth in the afternoon., Disp: , Rfl:    metoprolol succinate (TOPROL XL) 25 MG 24 hr tablet, Take 0.5 tablets (12.5 mg total) by mouth at bedtime., Disp: 30 tablet, Rfl: 3   omeprazole (PRILOSEC OTC) 20 MG tablet, Take 20 mg by mouth daily as needed  (indigestion/heartburn)., Disp: , Rfl:    potassium chloride SA (KLOR-CON) 20 MEQ tablet, Take 1 tablet (20 mEq total) by mouth daily. (Patient taking differently: Take 20 mEq by mouth every evening.), Disp: 90 tablet, Rfl: 1   vitamin B-12 (CYANOCOBALAMIN) 500 MCG tablet, Take 500 mcg by mouth in the morning., Disp: , Rfl:  No current facility-administered medications for this encounter.  Facility-Administered Medications Ordered in Other Encounters:    technetium tetrofosmin (TC-MYOVIEW) injection 39.0 millicurie, 30.0 millicurie, Intravenous, Once PRN, Stanford Breed, Denice Bors, MD  Past Medical History: Past Medical History:  Diagnosis Date   Anesthesia complication    PER PT, "HARD TO WAKE' UP PAST SHOULDER SURGERY IN 2016   Arthritis    lumbar, spondylosis, DDD,stenosis    Atrial premature depolarization    Chronic right hip pain    and right leg pain   Coronary artery disease    Diabetes mellitus without complication ([place])    Dysrhythmia    pt. reports that he has been told in the past that he has  irreg. heattbeat    Edema    Gout    bil ankles   Hyperlipidemia    Hypertension    Rotator cuff tear    right   Shortness of breath    Swelling of extremity    bil feet   Type 2 diabetes mellitus with hyperglycemia ([place])  Tobacco Use: Social History   Tobacco Use  Smoking Status Former   Packs/day: 0.30   Years: 10.00   Pack years: 3.00   Types: Cigarettes   Quit date: 11/04/1973   Years since quitting: 48.0  Smokeless Tobacco Never    Labs: Recent Review Flowsheet Data     Labs for ITP Cardiac and Pulmonary Rehab Latest Ref Rng & Units 07/16/2021 07/16/2021 07/16/2021 07/16/2021 07/16/2021   Hemoglobin A1c 4.8 - 5.6 % - - - - -   PHART 7.350 - 7.450 7.370 - 7.345(L) - 7.360   PCO2ART 32.0 - 48.0 mmHg 40.9 - 42.3 - 37.0   HCO3 20.0 - 28.0 mmol/L 23.6 - 23.4 21.2 20.7   TCO2 22 - 32 mmol/L _0 ACIDBASEDEF 0.0 - 2.0 mmol/L 2.0 - 3.0(H) 3.0(H) 4.0(H)   O2SAT  % 94.0 - 96.0 98.0 98.0       Capillary Blood Glucose: Lab Results  Component Value Date   GLUCAP 198 (H) 10/12/2021   GLUCAP 162 (H) 10/08/2021   GLUCAP 183 (H) 10/05/2021   GLUCAP 78 10/05/2021   GLUCAP 119 (H) 10/03/2021     Exercise Target Goals: Exercise Program Goal: Individual exercise prescription set using results from initial 6 min walk test and THRR while considering  patients activity barriers and safety.   Exercise Prescription Goal: Starting with aerobic activity 30 plus minutes a day, 3 days per week for initial exercise prescription. Provide home exercise prescription and guidelines that participant acknowledges understanding prior to discharge.  Activity Barriers & Risk Stratification:  Activity Barriers & Cardiac Risk Stratification - 09/25/21 1140       Activity Barriers & Cardiac Risk Stratification   Activity Barriers Neck/Spine Problems;Joint Problems;Back Problems;Deconditioning;Balance Concerns;Muscular Weakness    Cardiac Risk Stratification High             6 Minute Walk:  6 Minute Walk     Row Name 09/25/21 1013         6 Minute Walk   Phase Initial     Distance 1188 feet     Walk Time 6 minutes     # of Rest Breaks 0     MPH 2.25     METS 2.02     RPE 11     Perceived Dyspnea  0     VO2 Peak 7.07     Symptoms No     Resting HR 72 bpm     Resting BP 104/60     Resting Oxygen Saturation  100 %     Exercise Oxygen Saturation  during 6 min walk 97 %     Max Ex. HR 94 bpm     Max Ex. BP 126/70     2 Minute Post BP 112/70              Oxygen Initial Assessment:   Oxygen Re-Evaluation:   Oxygen Discharge (Final Oxygen Re-Evaluation):   Initial Exercise Prescription:  Initial Exercise Prescription - 09/25/21 1100       Date of Initial Exercise RX and Referring Provider   Date 09/25/21    Referring Provider Lyman Bishop, MD    Expected Discharge Date 11/23/21      NuStep   Level 2    SPM 75    Minutes 15     METs 2.6      Arm Ergometer   Level 1.5    RPM 50    Minutes 15  METs 2             Perform Capillary Blood Glucose checks as needed.  Exercise Prescription Changes:   Exercise Prescription Changes     Row Name 10/01/21 1200 10/10/21 1400 10/19/21 0950         Response to Exercise   Blood Pressure (Admit) 100/60 124/72 112/62     Blood Pressure (Exercise) 130/78 138/80 124/68     Blood Pressure (Exit) 104/60 108/72 120/70     Heart Rate (Admit) 75 bpm 69 bpm 69 bpm     Heart Rate (Exercise) 90 bpm 90 bpm 81 bpm     Heart Rate (Exit) 75 bpm 69 bpm 72 bpm     Rating of Perceived Exertion (Exercise) _0 Symptoms None None None     Comments Pt's first day in the CRP2 program Reviewed METs Reviewed METs goals and home exercise Rx     Duration Continue with 30 min of aerobic exercise without signs/symptoms of physical distress. Continue with 30 min of aerobic exercise without signs/symptoms of physical distress. Continue with 30 min of aerobic exercise without signs/symptoms of physical distress.     Intensity THRR unchanged THRR unchanged THRR unchanged       Progression   Progression Continue to progress workloads to maintain intensity without signs/symptoms of physical distress. Continue to progress workloads to maintain intensity without signs/symptoms of physical distress. Continue to progress workloads to maintain intensity without signs/symptoms of physical distress.     Average METs 1.8 2.2 2.2       Resistance Training   Weight 3 lbs -- 3 lbs     Reps 10-15 -- 10-15     Time 10 Minutes -- 10 Minutes       Interval Training   Interval Training No No No       NuStep   Level _1 SPM 75 75 75     Minutes _2 METs -- 2.3 2.3       Arm Ergometer   Level 1.5 1.6 1.8     Watts 13 -- --     RPM 52 -- --     Minutes _3 METs 1.8 2 2.3       Home Exercise Plan   Plans to continue exercise at -- -- Home (comment)      Frequency -- -- Add 2 additional days to program exercise sessions.     Initial Home Exercises Provided -- -- 10/19/21              Exercise Comments:   Exercise Comments     Row Name 10/01/21 1213 10/10/21 1429 10/19/21 1545       Exercise Comments Pt's first day in the CRP2 program. Pt tolerated initial exercise session well with no complaints. Reviewed METs with patient. He has progressed to an average MET level of 2.2 and plans to increase the level of the nustep next session. Progressing well, tolerating exercise well. Reviewed METs, goals and home exercise Rx. Pt verbaized understnading of the home exercise Rx and was provided a copy.              Exercise Goals and Review:   Exercise Goals     Row Name 09/25/21 1141             Exercise Goals   Increase Physical Activity  Yes       Intervention Provide advice, education, support and counseling about physical activity/exercise needs.;Develop an individualized exercise prescription for aerobic and resistive training based on initial evaluation findings, risk stratification, comorbidities and participant's personal goals.       Expected Outcomes Short Term: Attend rehab on a regular basis to increase amount of physical activity.;Long Term: Add in home exercise to make exercise part of routine and to increase amount of physical activity.;Long Term: Exercising regularly at least 3-5 days a week.       Increase Strength and Stamina Yes       Intervention Provide advice, education, support and counseling about physical activity/exercise needs.;Develop an individualized exercise prescription for aerobic and resistive training based on initial evaluation findings, risk stratification, comorbidities and participant's personal goals.       Expected Outcomes Short Term: Increase workloads from initial exercise prescription for resistance, speed, and METs.;Short Term: Perform resistance training exercises routinely during rehab and  add in resistance training at home;Long Term: Improve cardiorespiratory fitness, muscular endurance and strength as measured by increased METs and functional capacity (6MWT)       Able to understand and use rate of perceived exertion (RPE) scale Yes       Intervention Provide education and explanation on how to use RPE scale       Expected Outcomes Short Term: Able to use RPE daily in rehab to express subjective intensity level;Long Term:  Able to use RPE to guide intensity level when exercising independently       Knowledge and understanding of Target Heart Rate Range (THRR) Yes       Intervention Provide education and explanation of THRR including how the numbers were predicted and where they are located for reference       Expected Outcomes Short Term: Able to state/look up THRR;Long Term: Able to use THRR to govern intensity when exercising independently;Short Term: Able to use daily as guideline for intensity in rehab       Understanding of Exercise Prescription Yes       Intervention Provide education, explanation, and written materials on patient's individual exercise prescription       Expected Outcomes Short Term: Able to explain program exercise prescription;Long Term: Able to explain home exercise prescription to exercise independently                Exercise Goals Re-Evaluation :  Exercise Goals Re-Evaluation     Row Name 10/01/21 1212 10/19/21 1542           Exercise Goal Re-Evaluation   Exercise Goals Review Increase Physical Activity;Increase Strength and Stamina;Able to understand and use rate of perceived exertion (RPE) scale;Knowledge and understanding of Target Heart Rate Range (THRR);Understanding of Exercise Prescription Increase Physical Activity;Increase Strength and Stamina;Able to understand and use rate of perceived exertion (RPE) scale;Knowledge and understanding of Target Heart Rate Range (THRR);Understanding of Exercise Prescription      Comments Pt's first day  in the CRP2 program. Pt understnads the exercise Rx, THRR, and RPE scale. Reviewed METs, Goals and home exercise Rx. Pt is not currently doing any exercise at home. Pt was encouraged to begin walking 2-3x/week at home in addtion to attending the [place] program. Pt voices since beginning the program that he sees improvemnet in his strength, stamina, and balance.      Expected Outcomes Will continue to monitor patient and progress exercise workloads as tolerated. Pt will begin walking at home 2-3x/week.  Discharge Exercise Prescription (Final Exercise Prescription Changes):  Exercise Prescription Changes - 10/19/21 0950       Response to Exercise   Blood Pressure (Admit) 112/62    Blood Pressure (Exercise) 124/68    Blood Pressure (Exit) 120/70    Heart Rate (Admit) 69 bpm    Heart Rate (Exercise) 81 bpm    Heart Rate (Exit) 72 bpm    Rating of Perceived Exertion (Exercise) 11    Symptoms None    Comments Reviewed METs goals and home exercise Rx    Duration Continue with 30 min of aerobic exercise without signs/symptoms of physical distress.    Intensity THRR unchanged      Progression   Progression Continue to progress workloads to maintain intensity without signs/symptoms of physical distress.    Average METs 2.2      Resistance Training   Weight 3 lbs    Reps 10-15    Time 10 Minutes      Interval Training   Interval Training No      NuStep   Level 3    SPM 75    Minutes 15    METs 2.3      Arm Ergometer   Level 1.8    Minutes 15    METs 2.3      Home Exercise Plan   Plans to continue exercise at Home (comment)    Frequency Add 2 additional days to program exercise sessions.    Initial Home Exercises Provided 10/19/21             Nutrition:  Target Goals: Understanding of nutrition guidelines, daily intake of sodium <152m, cholesterol <2065m calories 30% from fat and 7% or less from saturated fats, daily to have 5 or more servings of  fruits and vegetables.  Biometrics:   Post Biometrics - 09/25/21 0930        Post  Biometrics   Waist Circumference 44 inches    Hip Circumference 47 inches    Waist to Hip Ratio 0.94 %    Triceps Skinfold 15 mm    % Body Fat 31.4 %    Grip Strength 39 kg    Flexibility 11 in    Single Leg Stand 2.37 seconds             Nutrition Therapy Plan and Nutrition Goals:   Nutrition Assessments:  MEDIFICTS Score Key: ?70 Need to make dietary changes  40-70 Heart Healthy Diet ? 40 Therapeutic Level Cholesterol Diet   Picture Your Plate Scores: <4<44nhealthy dietary pattern with much room for improvement. 41-50 Dietary pattern unlikely to meet recommendations for good health and room for improvement. 51-60 More healthful dietary pattern, with some room for improvement.  >60 Healthy dietary pattern, although there may be some specific behaviors that could be improved.    Nutrition Goals Re-Evaluation:   Nutrition Goals Discharge (Final Nutrition Goals Re-Evaluation):   Psychosocial: Target Goals: Acknowledge presence or absence of significant depression and/or stress, maximize coping skills, provide positive support system. Participant is able to verbalize types and ability to use techniques and skills needed for reducing stress and depression.  Initial Review & Psychosocial Screening:  Initial Psych Review & Screening - 09/25/21 1237       Initial Review   Current issues with None Identified      Family Dynamics   Good Support System? Yes   ViChaceas his wife and two children and grand children for support  Barriers   Psychosocial barriers to participate in program There are no identifiable barriers or psychosocial needs.      Screening Interventions   Interventions Encouraged to exercise             Quality of Life Scores:  Quality of Life - 09/25/21 1136       Quality of Life   Select Quality of Life      Quality of Life Scores    Health/Function Pre 23.89 %    Socioeconomic Pre 26.46 %    Psych/Spiritual Pre 27.57 %    Family Pre 28.8 %    GLOBAL Pre 25.96 %            Scores of 19 and below usually indicate a poorer quality of life in these areas.  A difference of  2-3 points is a clinically meaningful difference.  A difference of 2-3 points in the total score of the Quality of Life Index has been associated with significant improvement in overall quality of life, self-image, physical symptoms, and general health in studies assessing change in quality of life.  PHQ-9: Recent Review Flowsheet Data     Depression screen [facility] 2/9 09/25/2021   Decreased Interest 0   Down, Depressed, Hopeless 0   PHQ - 2 Score 0      Interpretation of Total Score  Total Score Depression Severity:  1-4 = Minimal depression, 5-9 = Mild depression, 10-14 = Moderate depression, 15-19 = Moderately severe depression, 20-27 = Severe depression   Psychosocial Evaluation and Intervention:   Psychosocial Re-Evaluation:  Psychosocial Re-Evaluation     [place] Name 10/01/21 1626 10/23/21 1657           Psychosocial Re-Evaluation   Current issues with None Identified None Identified      Interventions Encouraged to attend Cardiac Rehabilitation for the exercise Encouraged to attend Cardiac Rehabilitation for the exercise      Continue Psychosocial Services  No Follow up required No Follow up required               Psychosocial Discharge (Final Psychosocial Re-Evaluation):  Psychosocial Re-Evaluation - 10/23/21 1657       Psychosocial Re-Evaluation   Current issues with None Identified    Interventions Encouraged to attend Cardiac Rehabilitation for the exercise    Continue Psychosocial Services  No Follow up required             Vocational Rehabilitation: Provide vocational rehab assistance to qualifying candidates.   Vocational Rehab Evaluation & Intervention:  Vocational Rehab - 09/25/21 1239       Initial  Vocational Rehab Evaluation & Intervention   Assessment shows need for Vocational Rehabilitation No   Onyekachi is retired and does not need vocational rehab at this time.            Education: Education Goals: Education classes will be provided on a weekly basis, covering required topics. Participant will state understanding/return demonstration of topics presented.  Learning Barriers/Preferences:  Learning Barriers/Preferences - 09/25/21 1138       Learning Barriers/Preferences   Learning Barriers Sight   wears glasses   Learning Preferences Computer/Internet;Pictoral;Video             Education Topics: Hypertension, Hypertension Reduction -Define heart disease and high blood pressure. Discus how high blood pressure affects the body and ways to reduce high blood pressure.   Exercise and Your Heart -Discuss why it is important to exercise, the FITT principles of exercise, normal and  abnormal responses to exercise, and how to exercise safely.   Angina -Discuss definition of angina, causes of angina, treatment of angina, and how to decrease risk of having angina.   Cardiac Medications -Review what the following cardiac medications are used for, how they affect the body, and side effects that may occur when taking the medications.  Medications include Aspirin, Beta blockers, calcium channel blockers, ACE Inhibitors, angiotensin receptor blockers, diuretics, digoxin, and antihyperlipidemics.   Congestive Heart Failure -Discuss the definition of CHF, how to live with CHF, the signs and symptoms of CHF, and how keep track of weight and sodium intake.   Heart Disease and Intimacy -Discus the effect sexual activity has on the heart, how changes occur during intimacy as we age, and safety during sexual activity.   Smoking Cessation / COPD -Discuss different methods to quit smoking, the health benefits of quitting smoking, and the definition of COPD.   Nutrition I:  Fats -Discuss the types of cholesterol, what cholesterol does to the heart, and how cholesterol levels can be controlled.   Nutrition II: Labels -Discuss the different components of food labels and how to read food label   Heart Parts/Heart Disease and PAD -Discuss the anatomy of the heart, the pathway of blood circulation through the heart, and these are affected by heart disease.   Stress I: Signs and Symptoms -Discuss the causes of stress, how stress may lead to anxiety and depression, and ways to limit stress.   Stress II: Relaxation -Discuss different types of relaxation techniques to limit stress.   Warning Signs of Stroke / TIA -Discuss definition of a stroke, what the signs and symptoms are of a stroke, and how to identify when someone is having stroke.   Knowledge Questionnaire Score:  Knowledge Questionnaire Score - 09/25/21 1136       Knowledge Questionnaire Score   Pre Score 17/24             Core Components/Risk Factors/Patient Goals at Admission:  Personal Goals and Risk Factors at Admission - 09/25/21 1137       Core Components/Risk Factors/Patient Goals on Admission    Weight Management Yes;Obesity;Weight Loss    Intervention Weight Management: Develop a combined nutrition and exercise program designed to reach desired caloric intake, while maintaining appropriate intake of nutrient and fiber, sodium and fats, and appropriate energy expenditure required for the weight goal.;Weight Management: Provide education and appropriate resources to help participant work on and attain dietary goals.;Weight Management/Obesity: Establish reasonable short term and long term weight goals.;Obesity: Provide education and appropriate resources to help participant work on and attain dietary goals.    Admit Weight 243 lb 2.7 oz (110.3 kg)    Expected Outcomes Short Term: Continue to assess and modify interventions until short term weight is achieved;Long Term: Adherence to  nutrition and physical activity/exercise program aimed toward attainment of established weight goal;Weight Maintenance: Understanding of the daily nutrition guidelines, which includes 25-35% calories from fat, 7% or less cal from saturated fats, less than 220m cholesterol, less than 1.5gm of sodium, & 5 or more servings of fruits and vegetables daily;Weight Loss: Understanding of general recommendations for a balanced deficit meal plan, which promotes 1-2 lb weight loss per week and includes a negative energy balance of [phone] kcal/d;Understanding recommendations for meals to include 15-35% energy as protein, 25-35% energy from fat, 35-60% energy from carbohydrates, less than 2074mof dietary cholesterol, 20-35 gm of total fiber daily;Understanding of distribution of calorie intake throughout the day with  the consumption of 4-5 meals/snacks    Diabetes Yes    Intervention Provide education about signs/symptoms and action to take for hypo/hyperglycemia.;Provide education about proper nutrition, including hydration, and aerobic/resistive exercise prescription along with prescribed medications to achieve blood glucose in normal ranges: Fasting glucose 65-99 mg/dL    Expected Outcomes Short Term: Participant verbalizes understanding of the signs/symptoms and immediate care of hyper/hypoglycemia, proper foot care and importance of medication, aerobic/resistive exercise and nutrition plan for blood glucose control.;Long Term: Attainment of HbA1C < 7%.    Hypertension Yes    Intervention Provide education on lifestyle modifcations including regular physical activity/exercise, weight management, moderate sodium restriction and increased consumption of fresh fruit, vegetables, and low fat dairy, alcohol moderation, and smoking cessation.;Monitor prescription use compliance.    Expected Outcomes Short Term: Continued assessment and intervention until BP is < 140/60m HG in hypertensive participants. < 130/855mHG in  hypertensive participants with diabetes, heart failure or chronic kidney disease.;Long Term: Maintenance of blood pressure at goal levels.    Lipids Yes    Intervention Provide education and support for participant on nutrition & aerobic/resistive exercise along with prescribed medications to achieve LDL <7036mHDL >83m68m  Expected Outcomes Short Term: Participant states understanding of desired cholesterol values and is compliant with medications prescribed. Participant is following exercise prescription and nutrition guidelines.;Long Term: Cholesterol controlled with medications as prescribed, with individualized exercise RX and with personalized nutrition plan. Value goals: LDL < 70mg109mL > 40 mg.             Core Components/Risk Factors/Patient Goals Review:   Goals and Risk Factor Review     Row Name 10/01/21 1627 10/23/21 1657           Core Components/Risk Factors/Patient Goals Review   Personal Goals Review Weight Management/Obesity;Hypertension;Lipids;Diabetes Weight Management/Obesity;Hypertension;Lipids;Diabetes      Review VictoKueted exercise at [facility] on 10/01/21. Vital signs were stable. CBG's WNL with an erroneous reading of 72. VictoElickbeen doing well with exercise at cardiac rehab. Vital signs and CBG's have been stable.      Expected Outcomes VictoMoise continue to participate in phase 2 cardiac rehab for exercise, nutrition and lifestyle modifications. VictoDevlin continue to participate in phase 2 cardiac rehab for exercise, nutrition and lifestyle modifications.               Core Components/Risk Factors/Patient Goals at Discharge (Final Review):   Goals and Risk Factor Review - 10/23/21 1657       Core Components/Risk Factors/Patient Goals Review   Personal Goals Review Weight Management/Obesity;Hypertension;Lipids;Diabetes    Review VictoFreemonbeen doing well with exercise at cardiac rehab. Vital signs and CBG's have been stable.    Expected Outcomes  VictoJaiquan continue to participate in phase 2 cardiac rehab for exercise, nutrition and lifestyle modifications.             ITP Comments:  ITP Comments     Row Name 09/25/21 1235 10/01/21 1623 10/23/21 1656       ITP Comments Dr TraciFransico HimMedical Director 30 Day ITP Review. VictoKoented cardiac rehab on 10/01/21 and did well with exercise. 30 Day ITP Review. VictoTerekgood attendance and participation in phase 2 cardiac rehab.              Comments: See ITP comments.MariaHarrell GaveSN

## 2021-10-24 ENCOUNTER — Encounter (HOSPITAL_COMMUNITY)
Admission: RE | Admit: 2021-10-24 | Discharge: 2021-10-24 | Disposition: A | Discharge status: Home / Self Care | Payer: Medicare PPO | Source: Ambulatory Visit | Attending: Internal Medicine | Admitting: Internal Medicine

## 2021-10-24 ENCOUNTER — Other Ambulatory Visit: Payer: Self-pay

## 2021-10-24 DIAGNOSIS — Z951 Presence of aortocoronary bypass graft: Secondary | ICD-10-CM

## 2021-10-24 DIAGNOSIS — Z79899 Other long term (current) drug therapy: Secondary | ICD-10-CM | POA: Diagnosis not present

## 2021-10-26 ENCOUNTER — Encounter (HOSPITAL_COMMUNITY)
Admission: RE | Admit: 2021-10-26 | Discharge: 2021-10-26 | Disposition: A | Discharge status: Home / Self Care | Payer: Medicare PPO | Source: Ambulatory Visit | Attending: Internal Medicine | Admitting: Internal Medicine

## 2021-10-26 ENCOUNTER — Other Ambulatory Visit: Payer: Self-pay

## 2021-10-26 DIAGNOSIS — Z951 Presence of aortocoronary bypass graft: Secondary | ICD-10-CM | POA: Diagnosis not present

## 2021-10-26 DIAGNOSIS — Z79899 Other long term (current) drug therapy: Secondary | ICD-10-CM | POA: Diagnosis not present

## 2021-10-31 ENCOUNTER — Encounter (HOSPITAL_COMMUNITY): Payer: Medicare PPO

## 2021-11-02 ENCOUNTER — Encounter (HOSPITAL_COMMUNITY): Payer: Medicare PPO

## 2021-11-07 ENCOUNTER — Encounter (HOSPITAL_COMMUNITY): Payer: Medicare PPO

## 2021-11-09 ENCOUNTER — Encounter (HOSPITAL_COMMUNITY): Payer: Medicare PPO

## 2021-11-12 ENCOUNTER — Encounter (HOSPITAL_COMMUNITY): Payer: Medicare PPO

## 2021-11-14 ENCOUNTER — Telehealth (HOSPITAL_COMMUNITY): Payer: Self-pay | Admitting: *Deleted

## 2021-11-14 ENCOUNTER — Other Ambulatory Visit: Payer: Self-pay | Admitting: Surgical

## 2021-11-14 ENCOUNTER — Encounter (HOSPITAL_COMMUNITY): Payer: Medicare PPO

## 2021-11-14 NOTE — Telephone Encounter (Signed)
Left message to call cardiac rehab.Barnet Pall, RN,BSN 11/14/2021 8:34 AM

## 2021-11-16 ENCOUNTER — Other Ambulatory Visit: Payer: Self-pay

## 2021-11-16 ENCOUNTER — Encounter (HOSPITAL_COMMUNITY)
Admission: RE | Admit: 2021-11-16 | Discharge: 2021-11-16 | Disposition: A | Discharge status: Home / Self Care | Payer: Medicare PPO | Source: Ambulatory Visit | Attending: Internal Medicine | Admitting: Internal Medicine

## 2021-11-16 DIAGNOSIS — Z951 Presence of aortocoronary bypass graft: Secondary | ICD-10-CM | POA: Diagnosis not present

## 2021-11-16 DIAGNOSIS — Z48812 Encounter for surgical aftercare following surgery on the circulatory system: Secondary | ICD-10-CM | POA: Diagnosis not present

## 2021-11-16 LAB — GLUCOSE, CAPILLARY: Glucose-Capillary: 113 mg/dL — ABNORMAL HIGH (ref 70–99)

## 2021-11-19 ENCOUNTER — Encounter (HOSPITAL_COMMUNITY)
Admission: RE | Admit: 2021-11-19 | Discharge: 2021-11-19 | Disposition: A | Discharge status: Home / Self Care | Payer: Medicare PPO | Source: Ambulatory Visit | Attending: Internal Medicine | Admitting: Internal Medicine

## 2021-11-19 ENCOUNTER — Other Ambulatory Visit: Payer: Self-pay

## 2021-11-19 DIAGNOSIS — Z48812 Encounter for surgical aftercare following surgery on the circulatory system: Secondary | ICD-10-CM | POA: Diagnosis not present

## 2021-11-19 DIAGNOSIS — Z951 Presence of aortocoronary bypass graft: Secondary | ICD-10-CM | POA: Diagnosis not present

## 2021-11-19 LAB — GLUCOSE, CAPILLARY: Glucose-Capillary: 150 mg/dL — ABNORMAL HIGH (ref 70–99)

## 2021-11-20 NOTE — Progress Notes (Signed)
Cardiac Individual Treatment Plan  Patient Details  Name: Oscar [name] MRN: [id number] Date of Birth: Oct 24, 1946 Referring Provider:   Flowsheet Row CARDIAC REHAB PHASE II ORIENTATION from 09/25/2021 in [facility]  Referring Provider Lyman Bishop, MD       Initial Encounter Date:  Flowsheet Row CARDIAC REHAB PHASE II ORIENTATION from 09/25/2021 in [facility]  Date 09/25/21       Visit Diagnosis:  07/16/21 S/P CABG x 5  Patient's Home Medications on Admission:  Current Outpatient Medications:    amiodarone (PACERONE) 200 MG tablet, Take 1 tablet (200 mg total) by mouth daily., Disp: 90 tablet, Rfl: 3   apixaban (ELIQUIS) 5 MG TABS tablet, Take 1 tablet (5 mg total) by mouth 2 (two) times daily., Disp: 180 tablet, Rfl: 1   aspirin EC 81 MG tablet, Take 81 mg by mouth in the morning. Swallow whole., Disp: , Rfl:    atorvastatin (LIPITOR) 80 MG tablet, Take 1 tablet (80 mg total) by mouth daily., Disp: 60 tablet, Rfl: 1   furosemide (LASIX) 20 MG tablet, Take 1 tablet (20 mg total) by mouth daily., Disp: 90 tablet, Rfl: 3   halobetasol (ULTRAVATE) 0.05 % cream, Apply 1 application topically 2 (two) times daily as needed (psoriasis)., Disp: , Rfl:    hydrocortisone 2.5 % cream, Apply topically 2 (two) times daily. (Patient taking differently: Apply 1 application topically 2 (two) times daily as needed (psoriasis).), Disp: 30 g, Rfl: 4   Magnesium 250 MG TABS, Take 250 mg by mouth in the morning., Disp: , Rfl:    metFORMIN (GLUCOPHAGE) 500 MG tablet, Take 500-1,000 mg by mouth See admin instructions. Take 1 tablet (500 mg) by mouth in the morning & take 2 tablets (1000 mg) by mouth in the afternoon., Disp: , Rfl:    metoprolol succinate (TOPROL XL) 25 MG 24 hr tablet, Take 0.5 tablets (12.5 mg total) by mouth at bedtime., Disp: 30 tablet, Rfl: 3   omeprazole (PRILOSEC OTC) 20 MG tablet, Take 20 mg by mouth daily as needed  (indigestion/heartburn)., Disp: , Rfl:    potassium chloride SA (KLOR-CON) 20 MEQ tablet, Take 1 tablet (20 mEq total) by mouth daily. (Patient taking differently: Take 20 mEq by mouth every evening.), Disp: 90 tablet, Rfl: 1   vitamin B-12 (CYANOCOBALAMIN) 500 MCG tablet, Take 500 mcg by mouth in the morning., Disp: , Rfl:  No current facility-administered medications for this encounter.  Facility-Administered Medications Ordered in Other Encounters:    technetium tetrofosmin (TC-MYOVIEW) injection 39.0 millicurie, 30.0 millicurie, Intravenous, Once PRN, Stanford Breed, Denice Bors, MD  Past Medical History: Past Medical History:  Diagnosis Date   Anesthesia complication    PER PT, "HARD TO WAKE' UP PAST SHOULDER SURGERY IN 2016   Arthritis    lumbar, spondylosis, DDD,stenosis    Atrial premature depolarization    Chronic right hip pain    and right leg pain   Coronary artery disease    Diabetes mellitus without complication ([place])    Dysrhythmia    pt. reports that he has been told in the past that he has  irreg. heattbeat    Edema    Gout    bil ankles   Hyperlipidemia    Hypertension    Rotator cuff tear    right   Shortness of breath    Swelling of extremity    bil feet   Type 2 diabetes mellitus with hyperglycemia ([place])  Tobacco Use: Social History   Tobacco Use  Smoking Status Former   Packs/day: 0.30   Years: 10.00   Pack years: 3.00   Types: Cigarettes   Quit date: 11/04/1973   Years since quitting: 48.0  Smokeless Tobacco Never    Labs: Recent Review Flowsheet Data     Labs for ITP Cardiac and Pulmonary Rehab Latest Ref Rng & Units 07/16/2021 07/16/2021 07/16/2021 07/16/2021 07/16/2021   Hemoglobin A1c 4.8 - 5.6 % - - - - -   PHART 7.350 - 7.450 7.370 - 7.345(L) - 7.360   PCO2ART 32.0 - 48.0 mmHg 40.9 - 42.3 - 37.0   HCO3 20.0 - 28.0 mmol/L 23.6 - 23.4 21.2 20.7   TCO2 22 - 32 mmol/L $RemoveB'25 23 25 22 22   'AcMAgjgw$ ACIDBASEDEF 0.0 - 2.0 mmol/L 2.0 - 3.0(H) 3.0(H) 4.0(H)   O2SAT  % 94.0 - 96.0 98.0 98.0       Capillary Blood Glucose: Lab Results  Component Value Date   GLUCAP 150 (H) 11/19/2021   GLUCAP 113 (H) 11/16/2021   GLUCAP 198 (H) 10/12/2021   GLUCAP 162 (H) 10/08/2021   GLUCAP 183 (H) 10/05/2021     Exercise Target Goals: Exercise Program Goal: Individual exercise prescription set using results from initial 6 min walk test and THRR while considering  patients activity barriers and safety.   Exercise Prescription Goal: Starting with aerobic activity 30 plus minutes a day, 3 days per week for initial exercise prescription. Provide home exercise prescription and guidelines that participant acknowledges understanding prior to discharge.  Activity Barriers & Risk Stratification:  Activity Barriers & Cardiac Risk Stratification - 09/25/21 1140       Activity Barriers & Cardiac Risk Stratification   Activity Barriers Neck/Spine Problems;Joint Problems;Back Problems;Deconditioning;Balance Concerns;Muscular Weakness    Cardiac Risk Stratification High             6 Minute Walk:  6 Minute Walk     Row Name 09/25/21 1013         6 Minute Walk   Phase Initial     Distance 1188 feet     Walk Time 6 minutes     # of Rest Breaks 0     MPH 2.25     METS 2.02     RPE 11     Perceived Dyspnea  0     VO2 Peak 7.07     Symptoms No     Resting HR 72 bpm     Resting BP 104/60     Resting Oxygen Saturation  100 %     Exercise Oxygen Saturation  during 6 min walk 97 %     Max Ex. HR 94 bpm     Max Ex. BP 126/70     2 Minute Post BP 112/70              Oxygen Initial Assessment:   Oxygen Re-Evaluation:   Oxygen Discharge (Final Oxygen Re-Evaluation):   Initial Exercise Prescription:  Initial Exercise Prescription - 09/25/21 1100       Date of Initial Exercise RX and Referring Provider   Date 09/25/21    Referring Provider Lyman Bishop, MD    Expected Discharge Date 11/23/21      NuStep   Level 2    SPM 75    Minutes  15    METs 2.6      Arm Ergometer   Level 1.5    RPM 50    Minutes  15    METs 2             Perform Capillary Blood Glucose checks as needed.  Exercise Prescription Changes:   Exercise Prescription Changes     Row Name 10/01/21 1200 10/10/21 1400 10/19/21 0950 11/19/21 1200       Response to Exercise   Blood Pressure (Admit) 100/60 124/72 112/62 110/70    Blood Pressure (Exercise) 130/78 138/80 124/68 132/80    Blood Pressure (Exit) 104/60 108/72 120/70 99/65    Heart Rate (Admit) 75 bpm 69 bpm 69 bpm 71 bpm    Heart Rate (Exercise) 90 bpm 90 bpm 81 bpm 86 bpm    Heart Rate (Exit) 75 bpm 69 bpm 72 bpm 74 bpm    Rating of Perceived Exertion (Exercise) 11 11 11 11     Symptoms None None None None    Comments Pt's first day in the CRP2 program Reviewed METs Reviewed METs goals and home exercise Rx Reviewed METs and goals    Duration Continue with 30 min of aerobic exercise without signs/symptoms of physical distress. Continue with 30 min of aerobic exercise without signs/symptoms of physical distress. Continue with 30 min of aerobic exercise without signs/symptoms of physical distress. Continue with 30 min of aerobic exercise without signs/symptoms of physical distress.    Intensity THRR unchanged THRR unchanged THRR unchanged THRR unchanged      Progression   Progression Continue to progress workloads to maintain intensity without signs/symptoms of physical distress. Continue to progress workloads to maintain intensity without signs/symptoms of physical distress. Continue to progress workloads to maintain intensity without signs/symptoms of physical distress. Continue to progress workloads to maintain intensity without signs/symptoms of physical distress.    Average METs 1.8 2.2 2.2 2.2      Resistance Training   Weight 3 lbs -- 3 lbs 3 lbs    Reps 10-15 -- 10-15 10-15    Time 10 Minutes -- 10 Minutes 10 Minutes      Interval Training   Interval Training No No No No       NuStep   Level 2 2 3 5     SPM 75 75 75 75    Minutes 15 15 15 15     METs -- 2.3 2.3 2.7      Arm Ergometer   Level 1.5 1.6 1.8 2.3    Watts 13 -- -- --    RPM 52 -- -- --    Minutes 15 15 15 15     METs 1.8 2 2.3 1.57      Home Exercise Plan   Plans to continue exercise at -- -- Home (comment) Home (comment)    Frequency -- -- Add 2 additional days to program exercise sessions. Add 2 additional days to program exercise sessions.    Initial Home Exercises Provided -- -- 10/19/21 10/19/21             Exercise Comments:   Exercise Comments     Row Name 10/01/21 1213 10/10/21 1429 10/19/21 1545 11/19/21 1234     Exercise Comments Pt's first day in the CRP2 program. Pt tolerated initial exercise session well with no complaints. Reviewed METs with patient. He has progressed to an average MET level of 2.2 and plans to increase the level of the nustep next session. Progressing well, tolerating exercise well. Reviewed METs, goals and home exercise Rx. Pt verbaized understnading of the home exercise Rx and was provided a copy. Reviewed METs and goals. Pt  increased workloads on on nustep and Arm ergometer today.             Exercise Goals and Review:   Exercise Goals     Row Name 09/25/21 1141             Exercise Goals   Increase Physical Activity Yes       Intervention Provide advice, education, support and counseling about physical activity/exercise needs.;Develop an individualized exercise prescription for aerobic and resistive training based on initial evaluation findings, risk stratification, comorbidities and participant's personal goals.       Expected Outcomes Short Term: Attend rehab on a regular basis to increase amount of physical activity.;Long Term: Add in home exercise to make exercise part of routine and to increase amount of physical activity.;Long Term: Exercising regularly at least 3-5 days a week.       Increase Strength and Stamina Yes       Intervention  Provide advice, education, support and counseling about physical activity/exercise needs.;Develop an individualized exercise prescription for aerobic and resistive training based on initial evaluation findings, risk stratification, comorbidities and participant's personal goals.       Expected Outcomes Short Term: Increase workloads from initial exercise prescription for resistance, speed, and METs.;Short Term: Perform resistance training exercises routinely during rehab and add in resistance training at home;Long Term: Improve cardiorespiratory fitness, muscular endurance and strength as measured by increased METs and functional capacity (6MWT)       Able to understand and use rate of perceived exertion (RPE) scale Yes       Intervention Provide education and explanation on how to use RPE scale       Expected Outcomes Short Term: Able to use RPE daily in rehab to express subjective intensity level;Long Term:  Able to use RPE to guide intensity level when exercising independently       Knowledge and understanding of Target Heart Rate Range (THRR) Yes       Intervention Provide education and explanation of THRR including how the numbers were predicted and where they are located for reference       Expected Outcomes Short Term: Able to state/look up THRR;Long Term: Able to use THRR to govern intensity when exercising independently;Short Term: Able to use daily as guideline for intensity in rehab       Understanding of Exercise Prescription Yes       Intervention Provide education, explanation, and written materials on patient's individual exercise prescription       Expected Outcomes Short Term: Able to explain program exercise prescription;Long Term: Able to explain home exercise prescription to exercise independently                Exercise Goals Re-Evaluation :  Exercise Goals Re-Evaluation     Row Name 10/01/21 1212 10/19/21 1542 11/19/21 1229         Exercise Goal Re-Evaluation    Exercise Goals Review Increase Physical Activity;Increase Strength and Stamina;Able to understand and use rate of perceived exertion (RPE) scale;Knowledge and understanding of Target Heart Rate Range (THRR);Understanding of Exercise Prescription Increase Physical Activity;Increase Strength and Stamina;Able to understand and use rate of perceived exertion (RPE) scale;Knowledge and understanding of Target Heart Rate Range (THRR);Understanding of Exercise Prescription Increase Physical Activity;Increase Strength and Stamina;Able to understand and use rate of perceived exertion (RPE) scale;Knowledge and understanding of Target Heart Rate Range (THRR);Understanding of Exercise Prescription     Comments Pt's first day in the CRP2 program. Pt understnads the exercise  Rx, THRR, and RPE scale. Reviewed METs, Goals and home exercise Rx. Pt is not currently doing any exercise at home. Pt was encouraged to begin walking 2-3x/week at home in addtion to attending the [place] program. Pt voices since beginning the program that he sees improvemnet in his strength, stamina, and balance. Reviewed METs and Goals. Pt returning after a 2.5 week absence due to the holidays. Pt goal of weight loss; no signicicant weight loss to report. Pt goal of increased strength and stamina: pt reports feeling much stonger and able to do more at home. Pt has not achieved significant gains in MET level.     Expected Outcomes Will continue to monitor patient and progress exercise workloads as tolerated. Pt will begin walking at home 2-3x/week. Will continue to monitor and progress exercise workloads as tolerated.               Discharge Exercise Prescription (Final Exercise Prescription Changes):  Exercise Prescription Changes - 11/19/21 1200       Response to Exercise   Blood Pressure (Admit) 110/70    Blood Pressure (Exercise) 132/80    Blood Pressure (Exit) 99/65    Heart Rate (Admit) 71 bpm    Heart Rate (Exercise) 86 bpm    Heart  Rate (Exit) 74 bpm    Rating of Perceived Exertion (Exercise) 11    Symptoms None    Comments Reviewed METs and goals    Duration Continue with 30 min of aerobic exercise without signs/symptoms of physical distress.    Intensity THRR unchanged      Progression   Progression Continue to progress workloads to maintain intensity without signs/symptoms of physical distress.    Average METs 2.2      Resistance Training   Weight 3 lbs    Reps 10-15    Time 10 Minutes      Interval Training   Interval Training No      NuStep   Level 5    SPM 75    Minutes 15    METs 2.7      Arm Ergometer   Level 2.3    Minutes 15    METs 1.57      Home Exercise Plan   Plans to continue exercise at Home (comment)    Frequency Add 2 additional days to program exercise sessions.    Initial Home Exercises Provided 10/19/21             Nutrition:  Target Goals: Understanding of nutrition guidelines, daily intake of sodium '1500mg'$ , cholesterol '200mg'$ , calories 30% from fat and 7% or less from saturated fats, daily to have 5 or more servings of fruits and vegetables.  Biometrics:   Post Biometrics - 09/25/21 0930        Post  Biometrics   Waist Circumference 44 inches    Hip Circumference 47 inches    Waist to Hip Ratio 0.94 %    Triceps Skinfold 15 mm    % Body Fat 31.4 %    Grip Strength 39 kg    Flexibility 11 in    Single Leg Stand 2.37 seconds             Nutrition Therapy Plan and Nutrition Goals:   Nutrition Assessments:  MEDIFICTS Score Key: ?70 Need to make dietary changes  40-70 Heart Healthy Diet ? 40 Therapeutic Level Cholesterol Diet   Picture Your Plate Scores: <98 Unhealthy dietary pattern with much room for improvement. 41-50 Dietary pattern unlikely  to meet recommendations for good health and room for improvement. 51-60 More healthful dietary pattern, with some room for improvement.  >60 Healthy dietary pattern, although there may be some specific  behaviors that could be improved.    Nutrition Goals Re-Evaluation:   Nutrition Goals Discharge (Final Nutrition Goals Re-Evaluation):   Psychosocial: Target Goals: Acknowledge presence or absence of significant depression and/or stress, maximize coping skills, provide positive support system. Participant is able to verbalize types and ability to use techniques and skills needed for reducing stress and depression.  Initial Review & Psychosocial Screening:  Initial Psych Review & Screening - 09/25/21 1237       Initial Review   Current issues with None Identified      Family Dynamics   Good Support System? Yes   Oscar [name] has his wife and two children and grand children for support     Barriers   Psychosocial barriers to participate in program There are no identifiable barriers or psychosocial needs.      Screening Interventions   Interventions Encouraged to exercise             Quality of Life Scores:  Quality of Life - 09/25/21 1136       Quality of Life   Select Quality of Life      Quality of Life Scores   Health/Function Pre 23.89 %    Socioeconomic Pre 26.46 %    Psych/Spiritual Pre 27.57 %    Family Pre 28.8 %    GLOBAL Pre 25.96 %            Scores of 19 and below usually indicate a poorer quality of life in these areas.  A difference of  2-3 points is a clinically meaningful difference.  A difference of 2-3 points in the total score of the Quality of Life Index has been associated with significant improvement in overall quality of life, self-image, physical symptoms, and general health in studies assessing change in quality of life.  PHQ-9: Recent Review Flowsheet Data     Depression screen [facility] 2/9 09/25/2021   Decreased Interest 0   Down, Depressed, Hopeless 0   PHQ - 2 Score 0      Interpretation of Total Score  Total Score Depression Severity:  1-4 = Minimal depression, 5-9 = Mild depression, 10-14 = Moderate depression, 15-19 = Moderately  severe depression, 20-27 = Severe depression   Psychosocial Evaluation and Intervention:   Psychosocial Re-Evaluation:  Psychosocial Re-Evaluation     [place] Name 10/01/21 1626 10/23/21 1657 11/20/21 1504         Psychosocial Re-Evaluation   Current issues with None Identified None Identified None Identified     Interventions Encouraged to attend Cardiac Rehabilitation for the exercise Encouraged to attend Cardiac Rehabilitation for the exercise Encouraged to attend Cardiac Rehabilitation for the exercise     Continue Psychosocial Services  No Follow up required No Follow up required No Follow up required              Psychosocial Discharge (Final Psychosocial Re-Evaluation):  Psychosocial Re-Evaluation - 11/20/21 1504       Psychosocial Re-Evaluation   Current issues with None Identified    Interventions Encouraged to attend Cardiac Rehabilitation for the exercise    Continue Psychosocial Services  No Follow up required             Vocational Rehabilitation: Provide vocational rehab assistance to qualifying candidates.   Vocational Rehab Evaluation & Intervention:  Vocational  Rehab - 09/25/21 1239       Initial Vocational Rehab Evaluation & Intervention   Assessment shows need for Vocational Rehabilitation No   Naeem is retired and does not need vocational rehab at this time.            Education: Education Goals: Education classes will be provided on a weekly basis, covering required topics. Participant will state understanding/return demonstration of topics presented.  Learning Barriers/Preferences:  Learning Barriers/Preferences - 09/25/21 1138       Learning Barriers/Preferences   Learning Barriers Sight   wears glasses   Learning Preferences Computer/Internet;Pictoral;Video             Education Topics: Hypertension, Hypertension Reduction -Define heart disease and high blood pressure. Discus how high blood pressure affects the body and  ways to reduce high blood pressure.   Exercise and Your Heart -Discuss why it is important to exercise, the FITT principles of exercise, normal and abnormal responses to exercise, and how to exercise safely.   Angina -Discuss definition of angina, causes of angina, treatment of angina, and how to decrease risk of having angina.   Cardiac Medications -Review what the following cardiac medications are used for, how they affect the body, and side effects that may occur when taking the medications.  Medications include Aspirin, Beta blockers, calcium channel blockers, ACE Inhibitors, angiotensin receptor blockers, diuretics, digoxin, and antihyperlipidemics.   Congestive Heart Failure -Discuss the definition of CHF, how to live with CHF, the signs and symptoms of CHF, and how keep track of weight and sodium intake.   Heart Disease and Intimacy -Discus the effect sexual activity has on the heart, how changes occur during intimacy as we age, and safety during sexual activity.   Smoking Cessation / COPD -Discuss different methods to quit smoking, the health benefits of quitting smoking, and the definition of COPD.   Nutrition I: Fats -Discuss the types of cholesterol, what cholesterol does to the heart, and how cholesterol levels can be controlled.   Nutrition II: Labels -Discuss the different components of food labels and how to read food label   Heart Parts/Heart Disease and PAD -Discuss the anatomy of the heart, the pathway of blood circulation through the heart, and these are affected by heart disease.   Stress I: Signs and Symptoms -Discuss the causes of stress, how stress may lead to anxiety and depression, and ways to limit stress.   Stress II: Relaxation -Discuss different types of relaxation techniques to limit stress.   Warning Signs of Stroke / TIA -Discuss definition of a stroke, what the signs and symptoms are of a stroke, and how to identify when someone is  having stroke.   Knowledge Questionnaire Score:  Knowledge Questionnaire Score - 09/25/21 1136       Knowledge Questionnaire Score   Pre Score 17/24             Core Components/Risk Factors/Patient Goals at Admission:  Personal Goals and Risk Factors at Admission - 09/25/21 1137       Core Components/Risk Factors/Patient Goals on Admission    Weight Management Yes;Obesity;Weight Loss    Intervention Weight Management: Develop a combined nutrition and exercise program designed to reach desired caloric intake, while maintaining appropriate intake of nutrient and fiber, sodium and fats, and appropriate energy expenditure required for the weight goal.;Weight Management: Provide education and appropriate resources to help participant work on and attain dietary goals.;Weight Management/Obesity: Establish reasonable short term and long term weight goals.;Obesity: Provide  education and appropriate resources to help participant work on and attain dietary goals.    Admit Weight 243 lb 2.7 oz (110.3 kg)    Expected Outcomes Short Term: Continue to assess and modify interventions until short term weight is achieved;Long Term: Adherence to nutrition and physical activity/exercise program aimed toward attainment of established weight goal;Weight Maintenance: Understanding of the daily nutrition guidelines, which includes 25-35% calories from fat, 7% or less cal from saturated fats, less than $RemoveB'200mg'GEiIvttN$  cholesterol, less than 1.5gm of sodium, & 5 or more servings of fruits and vegetables daily;Weight Loss: Understanding of general recommendations for a balanced deficit meal plan, which promotes 1-2 lb weight loss per week and includes a negative energy balance of [phone] kcal/d;Understanding recommendations for meals to include 15-35% energy as protein, 25-35% energy from fat, 35-60% energy from carbohydrates, less than $RemoveB'200mg'mtlmFWct$  of dietary cholesterol, 20-35 gm of total fiber daily;Understanding of distribution of  calorie intake throughout the day with the consumption of 4-5 meals/snacks    Diabetes Yes    Intervention Provide education about signs/symptoms and action to take for hypo/hyperglycemia.;Provide education about proper nutrition, including hydration, and aerobic/resistive exercise prescription along with prescribed medications to achieve blood glucose in normal ranges: Fasting glucose 65-99 mg/dL    Expected Outcomes Short Term: Participant verbalizes understanding of the signs/symptoms and immediate care of hyper/hypoglycemia, proper foot care and importance of medication, aerobic/resistive exercise and nutrition plan for blood glucose control.;Long Term: Attainment of HbA1C < 7%.    Hypertension Yes    Intervention Provide education on lifestyle modifcations including regular physical activity/exercise, weight management, moderate sodium restriction and increased consumption of fresh fruit, vegetables, and low fat dairy, alcohol moderation, and smoking cessation.;Monitor prescription use compliance.    Expected Outcomes Short Term: Continued assessment and intervention until BP is < 140/64mm HG in hypertensive participants. < 130/81mm HG in hypertensive participants with diabetes, heart failure or chronic kidney disease.;Long Term: Maintenance of blood pressure at goal levels.    Lipids Yes    Intervention Provide education and support for participant on nutrition & aerobic/resistive exercise along with prescribed medications to achieve LDL '70mg'$ , HDL >$Remo'40mg'emGSe$ .    Expected Outcomes Short Term: Participant states understanding of desired cholesterol values and is compliant with medications prescribed. Participant is following exercise prescription and nutrition guidelines.;Long Term: Cholesterol controlled with medications as prescribed, with individualized exercise RX and with personalized nutrition plan. Value goals: LDL < $Rem'70mg'VDGs$ , HDL > 40 mg.             Core Components/Risk Factors/Patient Goals  Review:   Goals and Risk Factor Review     Row Name 10/01/21 1627 10/23/21 1657 11/20/21 1504         Core Components/Risk Factors/Patient Goals Review   Personal Goals Review Weight Management/Obesity;Hypertension;Lipids;Diabetes Weight Management/Obesity;Hypertension;Lipids;Diabetes Weight Management/Obesity;Hypertension;Lipids;Diabetes     Review Hoy started exercise at [facility] on 10/01/21. Vital signs were stable. CBG's WNL with an erroneous reading of 72. Oscar [name] has been doing well with exercise at cardiac rehab. Vital signs and CBG's have been stable. Oscar [name] has been doing well with exercise at cardiac rehab. Vital signs and CBG's have been stable. Oscar [name] returned to exercise after going out of town to visit his children. Oscar [name] has maintained his weight. Oscar [name] will complete cardiac rehab on 11/30/21.     Expected Outcomes Oscar [name] will continue to participate in phase 2 cardiac rehab for exercise, nutrition and lifestyle modifications. Oscar [name] will continue to participate in phase 2 cardiac rehab for exercise, nutrition and  lifestyle modifications. Oscar [name] will continue to participate in phase 2 cardiac rehab for exercise, nutrition and lifestyle modifications.              Core Components/Risk Factors/Patient Goals at Discharge (Final Review):   Goals and Risk Factor Review - 11/20/21 1504       Core Components/Risk Factors/Patient Goals Review   Personal Goals Review Weight Management/Obesity;Hypertension;Lipids;Diabetes    Review Oscar [name] has been doing well with exercise at cardiac rehab. Vital signs and CBG's have been stable. Oscar [name] returned to exercise after going out of town to visit his children. Oscar [name] has maintained his weight. Oscar [name] will complete cardiac rehab on 11/30/21.    Expected Outcomes Oscar [name] will continue to participate in phase 2 cardiac rehab for exercise, nutrition and lifestyle modifications.             ITP Comments:  ITP Comments     Row Name 09/25/21  1235 10/01/21 1623 10/23/21 1656 11/20/21 1503     ITP Comments Dr Fransico Him MD, Medical Director 30 Day ITP Review. Yakub started cardiac rehab on 10/01/21 and did well with exercise. 30 Day ITP Review. Miro has good attendance and participation in phase 2 cardiac rehab. 30 Day ITP Review. Lev has good attendance and participation in phase 2 cardiac rehab. Joseluis returned to exercise after going out of town to visit his children             Comments: See ITP comments.Harrell Gave RN BSN

## 2021-11-21 ENCOUNTER — Other Ambulatory Visit: Payer: Self-pay | Admitting: Surgical

## 2021-11-21 ENCOUNTER — Encounter (HOSPITAL_COMMUNITY)
Admission: RE | Admit: 2021-11-21 | Discharge: 2021-11-21 | Disposition: A | Discharge status: Home / Self Care | Payer: Medicare PPO | Source: Ambulatory Visit | Attending: Internal Medicine | Admitting: Internal Medicine

## 2021-11-21 ENCOUNTER — Other Ambulatory Visit: Payer: Self-pay

## 2021-11-21 DIAGNOSIS — Z951 Presence of aortocoronary bypass graft: Secondary | ICD-10-CM

## 2021-11-21 DIAGNOSIS — Z48812 Encounter for surgical aftercare following surgery on the circulatory system: Secondary | ICD-10-CM | POA: Diagnosis not present

## 2021-11-21 LAB — GLUCOSE, CAPILLARY: Glucose-Capillary: 150 mg/dL — ABNORMAL HIGH (ref 70–99)

## 2021-11-23 ENCOUNTER — Encounter (HOSPITAL_COMMUNITY)
Admission: RE | Admit: 2021-11-23 | Discharge: 2021-11-23 | Disposition: A | Discharge status: Home / Self Care | Payer: Medicare PPO | Source: Ambulatory Visit | Attending: Internal Medicine | Admitting: Internal Medicine

## 2021-11-23 ENCOUNTER — Other Ambulatory Visit: Payer: Self-pay

## 2021-11-23 DIAGNOSIS — Z48812 Encounter for surgical aftercare following surgery on the circulatory system: Secondary | ICD-10-CM | POA: Diagnosis not present

## 2021-11-23 DIAGNOSIS — Z951 Presence of aortocoronary bypass graft: Secondary | ICD-10-CM | POA: Diagnosis not present

## 2021-11-26 ENCOUNTER — Telehealth: Payer: Self-pay | Admitting: Internal Medicine

## 2021-11-26 ENCOUNTER — Encounter (HOSPITAL_COMMUNITY)
Admission: RE | Admit: 2021-11-26 | Discharge: 2021-11-26 | Disposition: A | Discharge status: Home / Self Care | Payer: Medicare PPO | Source: Ambulatory Visit | Attending: Internal Medicine | Admitting: Internal Medicine

## 2021-11-26 ENCOUNTER — Other Ambulatory Visit: Payer: Self-pay

## 2021-11-26 VITALS — Ht 71.5 in | Wt 242.3 lb

## 2021-11-26 DIAGNOSIS — Z951 Presence of aortocoronary bypass graft: Secondary | ICD-10-CM | POA: Diagnosis not present

## 2021-11-26 DIAGNOSIS — Z48812 Encounter for surgical aftercare following surgery on the circulatory system: Secondary | ICD-10-CM | POA: Diagnosis not present

## 2021-11-26 LAB — GLUCOSE, CAPILLARY: Glucose-Capillary: 118 mg/dL — ABNORMAL HIGH (ref 70–99)

## 2021-11-26 NOTE — Telephone Encounter (Signed)
*  STAT* If patient is at the pharmacy, call can be transferred to refill team.   1. Which medications need to be refilled? (please list name of each medication and dose if known)  atorvastatin (LIPITOR) 80 MG tablet      2. Which pharmacy/location (including street and city if local pharmacy) is medication to be sent to?  [facility] # [place], [place]  3. Do they need a 30 day or 90 day supply?  Patient would like to confirm whether or not he should still be taking this medication. If so, he would like a 90 day supply.

## 2021-11-27 MED ORDER — ATORVASTATIN CALCIUM 80 MG PO TABS: 80.0000 mg | ORAL_TABLET | Freq: Every day | ORAL | 0 refills | Status: DC

## 2021-11-27 NOTE — Telephone Encounter (Signed)
Upon looking over office notes patient should still be taking his Atorvastatin 80 MG once daily. I sent in a 90 day supply until his February appointment with Almyra Deforest.

## 2021-11-28 ENCOUNTER — Other Ambulatory Visit: Payer: Self-pay

## 2021-11-28 ENCOUNTER — Encounter (HOSPITAL_COMMUNITY)
Admission: RE | Admit: 2021-11-28 | Discharge: 2021-11-28 | Disposition: A | Discharge status: Home / Self Care | Payer: Medicare PPO | Source: Ambulatory Visit | Attending: Internal Medicine | Admitting: Internal Medicine

## 2021-11-28 DIAGNOSIS — Z951 Presence of aortocoronary bypass graft: Secondary | ICD-10-CM

## 2021-11-28 DIAGNOSIS — Z48812 Encounter for surgical aftercare following surgery on the circulatory system: Secondary | ICD-10-CM | POA: Diagnosis not present

## 2021-11-28 LAB — GLUCOSE, CAPILLARY: Glucose-Capillary: 137 mg/dL — ABNORMAL HIGH (ref 70–99)

## 2021-11-28 NOTE — Progress Notes (Signed)
Medications reviewed as Oscar [name] completes cardiac rehab on Friday. Oscar [name] said he has no refill on his atorvastatin. There is a note stating that a 90 day supply was renewed at the patient pharmacy. Patient notified.Barnet Pall, RN,BSN 11/28/2021 9:49 AM

## 2021-11-30 ENCOUNTER — Encounter (HOSPITAL_COMMUNITY)
Admission: RE | Admit: 2021-11-30 | Discharge: 2021-11-30 | Disposition: A | Discharge status: Home / Self Care | Payer: Medicare PPO | Source: Ambulatory Visit | Attending: Internal Medicine | Admitting: Internal Medicine

## 2021-11-30 ENCOUNTER — Other Ambulatory Visit: Payer: Self-pay

## 2021-11-30 DIAGNOSIS — Z48812 Encounter for surgical aftercare following surgery on the circulatory system: Secondary | ICD-10-CM | POA: Diagnosis not present

## 2021-11-30 DIAGNOSIS — Z951 Presence of aortocoronary bypass graft: Secondary | ICD-10-CM

## 2021-11-30 NOTE — Progress Notes (Signed)
Discharge Progress Report  Patient Details  Name: Oscar [name] MRN: [id number] Date of Birth: [birth year] Referring Provider:   Flowsheet Row CARDIAC REHAB PHASE II ORIENTATION from 09/25/2021 in [facility]  Referring Provider Lyman Bishop, MD        Number of Visits: 19  Reason for Discharge:  Patient reached a stable level of exercise. Patient independent in their exercise. Patient has met program and personal goals.  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  ADL UCSD:   Initial Exercise Prescription:  Initial Exercise Prescription - 09/25/21 1100       Date of Initial Exercise RX and Referring Provider   Date 09/25/21    Referring Provider Lyman Bishop, MD    Expected Discharge Date 11/23/21      NuStep   Level 2    SPM 75    Minutes 15    METs 2.6      Arm Ergometer   Level 1.5    RPM 50    Minutes 15    METs 2             Discharge Exercise Prescription (Final Exercise Prescription Changes):  Exercise Prescription Changes - 11/30/21 1240       Response to Exercise   Blood Pressure (Admit) 108/62    Blood Pressure (Exercise) 142/64    Blood Pressure (Exit) 106/60    Heart Rate (Admit) 70 bpm    Heart Rate (Exercise) 91 bpm    Heart Rate (Exit) 74 bpm    Rating of Perceived Exertion (Exercise) 11    Symptoms None    Comments Pt graduated from the [facility]2 program today    Duration Continue with 30 min of aerobic exercise without signs/symptoms of physical distress.    Intensity THRR unchanged      Progression   Progression Continue to progress workloads to maintain intensity without signs/symptoms of physical distress.    Average METs 2.7      Resistance Training   Weight 3 lbs    Reps  10-15    Time 10 Minutes      Interval Training   Interval Training No      NuStep   Level 5    SPM 85    Minutes 15    METs 2.9      Arm Ergometer   Level 3    Minutes 15    METs 2.5      Home Exercise Plan   Plans to continue exercise at Home (comment)    Frequency Add 2 additional days to program exercise sessions.    Initial Home Exercises Provided 10/19/21             Functional Capacity:  6 Minute Walk     Row Name 09/25/21 1013 11/21/21 0838       6 Minute Walk   Phase Initial Discharge    Distance 1188 feet 1265 feet    Distance % Change -- 13.15 %    Distance Feet Change -- 147 ft    Walk Time 6 minutes 6 minutes    # of Rest Breaks 0 0    MPH 2.25 2.4  METS 2.02 2.11    RPE 11 12    Perceived Dyspnea  0 0    VO2 Peak 7.07 7.39    Symptoms No No    Resting HR 72 bpm 77 bpm    Resting BP 104/60 118/74    Resting Oxygen Saturation  100 % --    Exercise Oxygen Saturation  during 6 min walk 97 % --    Max Ex. HR 94 bpm 94 bpm    Max Ex. BP 126/70 128/70    2 Minute Post BP 112/70 --             Psychological, QOL, Others - Outcomes: PHQ 2/9: Depression screen [facility] 2/9 11/30/2021 09/25/2021  Decreased Interest 0 0  Down, Depressed, Hopeless 0 0  PHQ - 2 Score 0 0    Quality of Life:  Quality of Life - 11/27/21 0844       Quality of Life Scores   Health/Function Pre 23.89 %    Health/Function Post 24.9 %    Health/Function % Change 4.23 %    Socioeconomic Pre 26.46 %    Socioeconomic Post 22.33 %    Socioeconomic % Change  -15.61 %    Psych/Spiritual Pre 27.57 %    Psych/Spiritual Post 24.86 %    Psych/Spiritual % Change -9.83 %    Family Pre 28.8 %    Family Post 26.4 %    Family % Change -8.33 %    GLOBAL Pre 25.96 %    GLOBAL Post 24.65 %    GLOBAL % Change -5.05 %             Personal Goals: Goals established at orientation with interventions provided to work toward goal.  Personal Goals and Risk Factors at  Admission - 09/25/21 1137       Core Components/Risk Factors/Patient Goals on Admission    Weight Management Yes;Obesity;Weight Loss    Intervention Weight Management: Develop a combined nutrition and exercise program designed to reach desired caloric intake, while maintaining appropriate intake of nutrient and fiber, sodium and fats, and appropriate energy expenditure required for the weight goal.;Weight Management: Provide education and appropriate resources to help participant work on and attain dietary goals.;Weight Management/Obesity: Establish reasonable short term and long term weight goals.;Obesity: Provide education and appropriate resources to help participant work on and attain dietary goals.    Admit Weight 243 lb 2.7 oz (110.3 kg)    Expected Outcomes Short Term: Continue to assess and modify interventions until short term weight is achieved;Long Term: Adherence to nutrition and physical activity/exercise program aimed toward attainment of established weight goal;Weight Maintenance: Understanding of the daily nutrition guidelines, which includes 25-35% calories from fat, 7% or less cal from saturated fats, less than 288m cholesterol, less than 1.5gm of sodium, & 5 or more servings of fruits and vegetables daily;Weight Loss: Understanding of general recommendations for a balanced deficit meal plan, which promotes 1-2 lb weight loss per week and includes a negative energy balance of [phone] kcal/d;Understanding recommendations for meals to include 15-35% energy as protein, 25-35% energy from fat, 35-60% energy from carbohydrates, less than 2030mof dietary cholesterol, 20-35 gm of total fiber daily;Understanding of distribution of calorie intake throughout the day with the consumption of 4-5 meals/snacks    Diabetes Yes    Intervention Provide education about signs/symptoms and action to take for hypo/hyperglycemia.;Provide education about proper nutrition, including hydration, and  aerobic/resistive exercise prescription along with prescribed medications to achieve blood glucose  in normal ranges: Fasting glucose 65-99 mg/dL    Expected Outcomes Short Term: Participant verbalizes understanding of the signs/symptoms and immediate care of hyper/hypoglycemia, proper foot care and importance of medication, aerobic/resistive exercise and nutrition plan for blood glucose control.;Long Term: Attainment of HbA1C < 7%.    Hypertension Yes    Intervention Provide education on lifestyle modifcations including regular physical activity/exercise, weight management, moderate sodium restriction and increased consumption of fresh fruit, vegetables, and low fat dairy, alcohol moderation, and smoking cessation.;Monitor prescription use compliance.    Expected Outcomes Short Term: Continued assessment and intervention until BP is < 140/30m HG in hypertensive participants. < 130/832mHG in hypertensive participants with diabetes, heart failure or chronic kidney disease.;Long Term: Maintenance of blood pressure at goal levels.    Lipids Yes    Intervention Provide education and support for participant on nutrition & aerobic/resistive exercise along with prescribed medications to achieve LDL <7059mHDL >37m51m  Expected Outcomes Short Term: Participant states understanding of desired cholesterol values and is compliant with medications prescribed. Participant is following exercise prescription and nutrition guidelines.;Long Term: Cholesterol controlled with medications as prescribed, with individualized exercise RX and with personalized nutrition plan. Value goals: LDL < 70mg39mL > 40 mg.              Personal Goals Discharge:  Goals and Risk Factor Review     Row Name 10/01/21 1627 10/23/21 1657 11/20/21 1504         Core Components/Risk Factors/Patient Goals Review   Personal Goals Review Weight Management/Obesity;Hypertension;Lipids;Diabetes Weight  Management/Obesity;Hypertension;Lipids;Diabetes Weight Management/Obesity;Hypertension;Lipids;Diabetes     Review Oscar [name] exercise at [facility] on 10/01/21. Vital signs were stable. CBG's WNL with an erroneous reading of 72. VictoColesonbeen doing well with exercise at cardiac rehab. Vital signs and CBG's have been stable. Oscar [name] doing well with exercise at cardiac rehab. Vital signs and CBG's have been stable. Oscar [name] to exercise after going out of town to visit his children. Oscar [name] his weight. Oscar [name] complete cardiac rehab on 11/30/21.     Expected Outcomes Oscar [name] continue to participate in phase 2 cardiac rehab for exercise, nutrition and lifestyle modifications. Oscar [name] continue to participate in phase 2 cardiac rehab for exercise, nutrition and lifestyle modifications. Oscar [name] continue to participate in phase 2 cardiac rehab for exercise, nutrition and lifestyle modifications.              Exercise Goals and Review:  Exercise Goals     Row Name 09/25/21 1141             Exercise Goals   Increase Physical Activity Yes       Intervention Provide advice, education, support and counseling about physical activity/exercise needs.;Develop an individualized exercise prescription for aerobic and resistive training based on initial evaluation findings, risk stratification, comorbidities and participant's personal goals.       Expected Outcomes Short Term: Attend rehab on a regular basis to increase amount of physical activity.;Long Term: Add in home exercise to make exercise part of routine and to increase amount of physical activity.;Long Term: Exercising regularly at least 3-5 days a week.       Increase Strength and Stamina Yes       Intervention Provide advice, education, support and counseling about physical activity/exercise needs.;Develop an individualized exercise prescription for aerobic and resistive training based on initial evaluation  findings, risk stratification, comorbidities and participant's personal goals.  Expected Outcomes Short Term: Increase workloads from initial exercise prescription for resistance, speed, and METs.;Short Term: Perform resistance training exercises routinely during rehab and add in resistance training at home;Long Term: Improve cardiorespiratory fitness, muscular endurance and strength as measured by increased METs and functional capacity (6MWT)       Able to understand and use rate of perceived exertion (RPE) scale Yes       Intervention Provide education and explanation on how to use RPE scale       Expected Outcomes Short Term: Able to use RPE daily in rehab to express subjective intensity level;Long Term:  Able to use RPE to guide intensity level when exercising independently       Knowledge and understanding of Target Heart Rate Range (THRR) Yes       Intervention Provide education and explanation of THRR including how the numbers were predicted and where they are located for reference       Expected Outcomes Short Term: Able to state/look up THRR;Long Term: Able to use THRR to govern intensity when exercising independently;Short Term: Able to use daily as guideline for intensity in rehab       Understanding of Exercise Prescription Yes       Intervention Provide education, explanation, and written materials on patient's individual exercise prescription       Expected Outcomes Short Term: Able to explain program exercise prescription;Long Term: Able to explain home exercise prescription to exercise independently                Exercise Goals Re-Evaluation:  Exercise Goals Re-Evaluation     Row Name 10/01/21 1212 10/19/21 1542 11/19/21 1229 11/30/21 1243       Exercise Goal Re-Evaluation   Exercise Goals Review Increase Physical Activity;Increase Strength and Stamina;Able to understand and use rate of perceived exertion (RPE) scale;Knowledge and understanding of Target Heart Rate  Range (THRR);Understanding of Exercise Prescription Increase Physical Activity;Increase Strength and Stamina;Able to understand and use rate of perceived exertion (RPE) scale;Knowledge and understanding of Target Heart Rate Range (THRR);Understanding of Exercise Prescription Increase Physical Activity;Increase Strength and Stamina;Able to understand and use rate of perceived exertion (RPE) scale;Knowledge and understanding of Target Heart Rate Range (THRR);Understanding of Exercise Prescription Increase Physical Activity;Increase Strength and Stamina;Able to understand and use rate of perceived exertion (RPE) scale;Knowledge and understanding of Target Heart Rate Range (THRR);Understanding of Exercise Prescription    Comments Pt's first day in the [facility] program. Pt understnads the exercise Rx, THRR, and RPE scale. Reviewed METs, Goals and home exercise Rx. Pt is not currently doing any exercise at home. Pt was encouraged to begin walking 2-3x/week at home in addtion to attending the [place] program. Pt voices since beginning the program that he sees improvemnet in his strength, stamina, and balance. Reviewed METs and Goals. Pt returning after a 2.5 week absence due to the holidays. Pt goal of weight loss; no signicicant weight loss to report. Pt goal of increased strength and stamina: pt reports feeling much stonger and able to do more at home. Pt has not achieved significant gains in MET level. Pt graduated from the [place] program. Pt had peak METs of 3.0. Pt voices increased strength and stamina. Pt plans to continue his exercise at home using [employer] videos. Pt considering joing the [employer] for exercise. Pt has not been doing any significant amount of walking at home as per the home exercise Rx.    Expected Outcomes Will continue to monitor patient and progress exercise workloads  as tolerated. Pt will begin walking at home 2-3x/week. Will continue to monitor and progress exercise workloads as tolerated. Pt will continue  to exercise at home or [employer].             Nutrition & Weight - Outcomes:   Post Biometrics - 11/23/21 0945        Post  Biometrics   Height 5' 11.5" (1.816 m)    Weight 109.9 kg    Waist Circumference 44 inches    Hip Circumference 47 inches    Waist to Hip Ratio 0.94 %    BMI (Calculated) 33.32    Triceps Skinfold 13 mm    % Body Fat 30.8 %    Grip Strength 42 kg    Flexibility 12.75 in    Single Leg Stand 3.12 seconds             Nutrition:   Nutrition Discharge:   Education Questionnaire Score:  Knowledge Questionnaire Score - 11/26/21 0843       Knowledge Questionnaire Score   Post Score 23/24             Goals reviewed with patient; copy given to patient.Pt graduated from cardiac rehab program on 11/30/21 with completion of 19 exercise sessions in Phase II. Pt maintained good attendance and progressed nicely during his participation in rehab as evidenced by increased MET level.   Medication list reconciled. Repeat  PHQ score- 0 .  Pt has made significant lifestyle changes and should be commended for his success. Pt feels he has achieved his goals during cardiac rehab.   Pt plans to continue exercise by walking, using exercise u tube videos for seniors, weights at home. [name] may consider joining the [facility]. Tyren increased his distance on his post exercise walk test by 147 feet. We are proud of [name] progress!Harrell Gave RN BSN

## 2021-12-03 DIAGNOSIS — E538 Deficiency of other specified B group vitamins: Secondary | ICD-10-CM | POA: Diagnosis not present

## 2021-12-04 DIAGNOSIS — M109 Gout, unspecified: Secondary | ICD-10-CM | POA: Diagnosis not present

## 2021-12-04 DIAGNOSIS — I10 Essential (primary) hypertension: Secondary | ICD-10-CM | POA: Diagnosis not present

## 2021-12-04 DIAGNOSIS — E785 Hyperlipidemia, unspecified: Secondary | ICD-10-CM | POA: Diagnosis not present

## 2021-12-04 DIAGNOSIS — E1169 Type 2 diabetes mellitus with other specified complication: Secondary | ICD-10-CM | POA: Diagnosis not present

## 2021-12-04 DIAGNOSIS — Z125 Encounter for screening for malignant neoplasm of prostate: Secondary | ICD-10-CM | POA: Diagnosis not present

## 2021-12-09 NOTE — Progress Notes (Signed)
Office Visit    Patient Name: Oscar [name] Date of Encounter: 12/10/2021  Primary Care Provider:  Sueanne Margarita, DO Primary Cardiologist:  Pixie Casino, MD  Chief Complaint    76 year old male with a history of CAD s/p CABG x5, paroxysmal atrial fibrillation on Eliquis (CHA2DS2VASc = 5), palpitations, PVCs, hypertension, hyperlipidemia, gout and type 2 diabetes who presents for follow-up related to CAD and PAF.  Past Medical History    Past Medical History:  Diagnosis Date   Anesthesia complication    PER PT, "HARD TO WAKE' UP PAST SHOULDER SURGERY IN 2016   Arthritis    lumbar, spondylosis, DDD,stenosis    Atrial premature depolarization    Chronic right hip pain    and right leg pain   Coronary artery disease    Diabetes mellitus without complication ([place])    Dysrhythmia    pt. reports that he has been told in the past that he has  irreg. heattbeat    Edema    Gout    bil ankles   Hyperlipidemia    Hypertension    Rotator cuff tear    right   Shortness of breath    Swelling of extremity    bil feet   Type 2 diabetes mellitus with hyperglycemia [facility])    Past Surgical History:  Procedure Laterality Date   BACK SURGERY     January 2014   CARDIAC CATHETERIZATION     2004, outcome good, to be followed for/with medical therapy    CORONARY ARTERY BYPASS GRAFT N/A 07/16/2021   Procedure: CORONARY ARTERY BYPASS GRAFTING (CABG) X FIVE, USING LEFT INTERNAL MAMMARY ARTERY AND RIGHT LEG GREATER SAPHENOUS VEIN HARVESTED ENDOSCOPICALLY;  Surgeon: Melrose Nakayama, MD;  Location: [place];  Service: Open Heart Surgery;  Laterality: N/A;   ELBOW BURSA SURGERY     removed bursacs both elbows   ENDOVEIN HARVEST OF GREATER SAPHENOUS VEIN  07/16/2021   Procedure: ENDOVEIN HARVEST OF GREATER SAPHENOUS VEIN;  Surgeon: Melrose Nakayama, MD;  Location: [facility] OR;  Service: Open Heart Surgery;;   FASCIECTOMY Left 12/12/2020   Procedure: FASCIECTOMY LEFT SMALL FINGER;  Surgeon:  Daryll Brod, MD;  Location: [place];  Service: Orthopedics;  Laterality: Left;   LEFT HEART CATH AND CORONARY ANGIOGRAPHY N/A 07/12/2021   Procedure: LEFT HEART CATH AND CORONARY ANGIOGRAPHY;  Surgeon: Burnell Blanks, MD;  Location: [place] CV LAB;  Service: Cardiovascular;  Laterality: N/A;   SHOULDER ARTHROSCOPY WITH ROTATOR CUFF REPAIR AND SUBACROMIAL DECOMPRESSION Right 08/21/2015   Procedure: SHOULDER ARTHROSCOPY WITH ROTATOR CUFF REPAIR S UBACROMIAL DECOMPRESSION, ;  Surgeon: Tania Ade, MD;  Location: [place];  Service: Orthopedics;  Laterality: Right;  Right shoulder arthroscopy rotator cuff repair vs debridement, subacromial decompression, possible biceps tenotomy.   TEE WITHOUT CARDIOVERSION N/A 07/16/2021   Procedure: TRANSESOPHAGEAL ECHOCARDIOGRAM (TEE);  Surgeon: Melrose Nakayama, MD;  Location: [place];  Service: Open Heart Surgery;  Laterality: N/A;    Allergies  Allergies  Allergen Reactions   Yellow Fever Vaccine Hives    History of Present Illness    76 year old male with the above past medical history including CAD s/p CABG x5, paroxysmal atrial fibrillation on Eliquis  (CHA2DS2VASc = 5), palpitations, PVCs, hypertension, hyperlipidemia, gout and type 2 diabetes.  History of negative stress test in 2006, 2017, and 2021. Coronary CTA in August 2022 showed multivessel CAD. Follow-up cardiac catheterization in September 2022 showed severe three-vessel CAD. CT surgery  was consulted and he underwent CABG x5 07/16/2021 (LIMA-LAD, SVG-diagonal, SVG-PDA and sequential SVG -OM 2 and OM 3). Echocardiogram at the time showed EF to 55 to 60%, mild dilation of ascending aorta, 39 mm.  He did have postop atrial fibrillation though he eventually converted to normal sinus rhythm on amiodarone. He has been seen in the A. fib clinic.  Follow-up chest x-ray per CT surgery showed moderate left pleural effusion, and his Lasix was increased. He  was last seen in the office on August 17, 2021 and was stable overall from a cardiac standpoint. Follow-up chest x-ray per CT surgery showed ongoing left pleural effusion, smaller in size, not felt to be large enough to warrant intervention with thoracentesis or medication.   He presents today for follow-up.  Since his last visit he has been stable from a cardiac standpoint.  He just completed cardiac rehab.  He denies any symptoms concerning for angina.  He does have some mild incisional discomfort, which he describes as pulling, tingling, twinges. He states this has become more noticeable as he has lost weight since his surgery. Overall, he reports feeling well and denies any specific concerns or complaints today.  Home Medications    Current Outpatient Medications  Medication Sig Dispense Refill   amiodarone (PACERONE) 200 MG tablet Take 1 tablet (200 mg total) by mouth daily. 90 tablet 3   apixaban (ELIQUIS) 5 MG TABS tablet Take 1 tablet (5 mg total) by mouth 2 (two) times daily. 180 tablet 1   aspirin EC 81 MG tablet Take 81 mg by mouth in the morning. Swallow whole.     atorvastatin (LIPITOR) 80 MG tablet Take 1 tablet (80 mg total) by mouth daily. KEEP FEB. OV. 90 tablet 0   furosemide (LASIX) 20 MG tablet Take 1 tablet (20 mg total) by mouth daily. 90 tablet 3   halobetasol (ULTRAVATE) 0.05 % cream Apply 1 application topically 2 (two) times daily as needed (psoriasis).     hydrocortisone 2.5 % cream Apply topically 2 (two) times daily. (Patient taking differently: Apply 1 application topically 2 (two) times daily as needed (psoriasis).) 30 g 4   metFORMIN (GLUCOPHAGE) 500 MG tablet Take 500-1,000 mg by mouth See admin instructions. Take 1 tablet (500 mg) by mouth in the morning & take 2 tablets (1000 mg) by mouth in the afternoon.     metoprolol succinate (TOPROL XL) 25 MG 24 hr tablet Take 0.5 tablets (12.5 mg total) by mouth at bedtime. 30 tablet 3   potassium chloride SA (KLOR-CON) 20  MEQ tablet Take 1 tablet (20 mEq total) by mouth daily. (Patient taking differently: Take 20 mEq by mouth every evening.) 90 tablet 1   vitamin B-12 (CYANOCOBALAMIN) 500 MCG tablet Take 500 mcg by mouth in the morning.     losartan (COZAAR) 50 MG tablet Take by mouth.     Magnesium 250 MG TABS Take 250 mg by mouth in the morning. (Patient not taking: Reported on 12/10/2021)     omeprazole (PRILOSEC OTC) 20 MG tablet Take 20 mg by mouth daily as needed (indigestion/heartburn). (Patient not taking: Reported on 12/10/2021)     No current facility-administered medications for this visit.   Facility-Administered Medications Ordered in Other Visits  Medication Dose Route Frequency Provider Last Rate Last Admin   technetium tetrofosmin (TC-MYOVIEW) injection 71.2 millicurie  45.8 millicurie Intravenous Once PRN Lelon Perla, MD         Review of Systems    He denies  chest pain, palpitations, dyspnea, pnd, orthopnea, n, v, dizziness, syncope, edema, weight gain, or early satiety. All other systems reviewed and are otherwise negative except as noted above.   Physical Exam    VS:  BP 120/68    Pulse 65    Ht 6' (1.829 m)    Wt 246 lb 9.6 oz (111.9 kg)    SpO2 93%    BMI 33.44 kg/m  GEN: Well nourished, well developed, in no acute distress. HEENT: normal. Neck: Supple, no JVD, carotid bruits, or masses. Cardiac: RRR, no murmurs, rubs, or gallops. No clubbing, cyanosis, edema.  Radials/DP/PT 2+ and equal bilaterally.  Sternotomy incisional site well approximated, clean, dry and intact.  Respiratory:  Respirations regular and unlabored, clear to auscultation bilaterally. GI: Soft, nontender, nondistended, BS + x 4. MS: no deformity or atrophy. Skin: warm and dry, no rash. Neuro:  Strength and sensation are intact. Psych: Normal affect.  Accessory Clinical Findings    ECG personally reviewed by me today - No EKG in office today.   Lab Results  Component Value Date   WBC 5.4 07/21/2021   HGB  9.0 (L) 07/21/2021   HCT 27.6 (L) 07/21/2021   MCV 93.9 07/21/2021   PLT 165 07/21/2021   Lab Results  Component Value Date   CREATININE 1.48 (H) 08/17/2021   BUN 14 08/17/2021   NA 140 08/17/2021   K 4.4 08/17/2021   CL 100 08/17/2021   CO2 21 08/17/2021   Lab Results  Component Value Date   ALT 16 08/17/2021   AST 20 08/17/2021   ALKPHOS 89 08/17/2021   BILITOT 0.9 08/17/2021   No results found for: CHOL, HDL, LDLCALC, LDLDIRECT, TRIG, CHOLHDL  Lab Results  Component Value Date   HGBA1C 5.3 07/15/2021    Assessment & Plan    1. CAD: S/p CABG x 5 (LIMA-LAD, SVG-diagonal, SVG-PDA and sequential SVG -OM 2 and OM 3) in 07/2021. Echo showed EF 55-60%, mild dilation ascending aorta, 39 mm. Stable with no anginal symptoms. No indication for ischemic evaluation.  He just completed cardiac rehab.  He does have some mild incisional discomfort, that he describes as twinges, pulling.  I advised him to follow-up with CT surgery if this does not improve or continues to bother him. Incisional site well approximated, clean, dry, intact.  Continue aspirin, Lipitor, Lasix, Toprol XL.  2. Paroxysmal atrial fibrillation: Diagnosed post-op CABG. Converted to NSR on amiodarone. On Eliquis. Follows in a fib clinic. Continue amiodarone, Eliquis, metoprolol.  3. H/o left pleural effusion: Stable on exam today, lungs clear on exam, denies dyspnea. Euvolemic on exam. Continue Lasix.   4. Mild dilation ascending aorta: Most recent echo showed mild dilation of ascending aorta, 39 mm, recommend routine monitoring with annual echocardiogram.   5. Hypertension: BP well controlled. Continue current antihypertensive regimen.   6. Hyperlipidemia: LDL was 89 on 12/04/21. Monitored and managed by PCP. LDL goal <70, consider addition of Zetia, pt would like to discuss with PCP.   7. Disposition: F/u in 4 months with Dr. Debara Pickett.    Lenna Sciara, NP 12/10/2021, 10:53 AM

## 2021-12-10 ENCOUNTER — Other Ambulatory Visit: Payer: Self-pay

## 2021-12-10 ENCOUNTER — Encounter: Payer: Self-pay | Admitting: Physician Assistant

## 2021-12-10 ENCOUNTER — Ambulatory Visit: Payer: Medicare PPO | Admitting: Nurse Practitioner

## 2021-12-10 VITALS — BP 120/68 | HR 65 | Ht 72.0 in | Wt 246.6 lb

## 2021-12-10 DIAGNOSIS — I10 Essential (primary) hypertension: Secondary | ICD-10-CM

## 2021-12-10 DIAGNOSIS — I77810 Thoracic aortic ectasia: Secondary | ICD-10-CM | POA: Diagnosis not present

## 2021-12-10 DIAGNOSIS — E785 Hyperlipidemia, unspecified: Secondary | ICD-10-CM | POA: Diagnosis not present

## 2021-12-10 DIAGNOSIS — I2510 Atherosclerotic heart disease of native coronary artery without angina pectoris: Secondary | ICD-10-CM | POA: Diagnosis not present

## 2021-12-10 DIAGNOSIS — I480 Paroxysmal atrial fibrillation: Secondary | ICD-10-CM

## 2021-12-10 DIAGNOSIS — Z8709 Personal history of other diseases of the respiratory system: Secondary | ICD-10-CM

## 2021-12-10 NOTE — Patient Instructions (Signed)
Medication Instructions:  Your physician recommends that you continue on your current medications as directed. Please refer to the Current Medication list given to you today.   *If you need a refill on your cardiac medications before your next appointment, please call your pharmacy*   Lab Work: NONE ordered at this time of appointment    Testing/Procedures: NONE ordered at this time of appointment     Follow-Up: At [facility], you and your health needs are our priority.  As part of our continuing mission to provide you with exceptional heart care, we have created designated Provider Care Teams.  These Care Teams include your primary Cardiologist (physician) and Advanced Practice Providers (APPs -  Physician Assistants and Nurse Practitioners) who all work together to provide you with the care you need, when you need it.  We recommend signing up for the patient portal called "MyChart".  Sign up information is provided on this After Visit Summary.  MyChart is used to connect with patients for Virtual Visits (Telemedicine).  Patients are able to view lab/test results, encounter notes, upcoming appointments, etc.  Non-urgent messages can be sent to your provider as well.   To learn more about what you can do with MyChart, go to [email or web address].    Your next appointment:   4 month(s)  The format for your next appointment:   In Person  Provider:   Pixie Casino, MD     Other Instructions LDL Goal less than 70. Consider Zetia 10 mg daily

## 2021-12-11 DIAGNOSIS — R7989 Other specified abnormal findings of blood chemistry: Secondary | ICD-10-CM | POA: Diagnosis not present

## 2021-12-11 DIAGNOSIS — I25118 Atherosclerotic heart disease of native coronary artery with other forms of angina pectoris: Secondary | ICD-10-CM | POA: Diagnosis not present

## 2021-12-11 DIAGNOSIS — E1169 Type 2 diabetes mellitus with other specified complication: Secondary | ICD-10-CM | POA: Diagnosis not present

## 2021-12-11 DIAGNOSIS — I4891 Unspecified atrial fibrillation: Secondary | ICD-10-CM | POA: Diagnosis not present

## 2021-12-11 DIAGNOSIS — E785 Hyperlipidemia, unspecified: Secondary | ICD-10-CM | POA: Diagnosis not present

## 2021-12-11 DIAGNOSIS — Z0000 Encounter for general adult medical examination without abnormal findings: Secondary | ICD-10-CM | POA: Diagnosis not present

## 2021-12-11 DIAGNOSIS — D6869 Other thrombophilia: Secondary | ICD-10-CM | POA: Diagnosis not present

## 2021-12-31 ENCOUNTER — Telehealth: Payer: Self-pay | Admitting: Internal Medicine

## 2021-12-31 NOTE — Telephone Encounter (Signed)
Reviewing documentation from last patient appointment on 2/6, noted that patient is stable from cardiac standpoint. There we no restrictions noted in chart. Patient reminded to listen to his body and not over do it and to stay hydrated as weather gets warmer. Patient agreed with plan no additional questions at this time.

## 2021-12-31 NOTE — Telephone Encounter (Signed)
°  Patient is calling to make sure that it will be okay for him to go play golf for a couple of days. Please advise.

## 2022-01-11 DIAGNOSIS — D6869 Other thrombophilia: Secondary | ICD-10-CM | POA: Diagnosis not present

## 2022-01-11 DIAGNOSIS — I10 Essential (primary) hypertension: Secondary | ICD-10-CM | POA: Diagnosis not present

## 2022-01-11 DIAGNOSIS — I25118 Atherosclerotic heart disease of native coronary artery with other forms of angina pectoris: Secondary | ICD-10-CM | POA: Diagnosis not present

## 2022-01-11 DIAGNOSIS — E785 Hyperlipidemia, unspecified: Secondary | ICD-10-CM | POA: Diagnosis not present

## 2022-01-11 DIAGNOSIS — R002 Palpitations: Secondary | ICD-10-CM | POA: Diagnosis not present

## 2022-01-11 DIAGNOSIS — I4891 Unspecified atrial fibrillation: Secondary | ICD-10-CM | POA: Diagnosis not present

## 2022-01-11 DIAGNOSIS — M109 Gout, unspecified: Secondary | ICD-10-CM | POA: Diagnosis not present

## 2022-01-11 DIAGNOSIS — E1169 Type 2 diabetes mellitus with other specified complication: Secondary | ICD-10-CM | POA: Diagnosis not present

## 2022-02-01 DIAGNOSIS — I2510 Atherosclerotic heart disease of native coronary artery without angina pectoris: Secondary | ICD-10-CM | POA: Diagnosis not present

## 2022-02-01 DIAGNOSIS — I10 Essential (primary) hypertension: Secondary | ICD-10-CM | POA: Diagnosis not present

## 2022-02-01 DIAGNOSIS — E1169 Type 2 diabetes mellitus with other specified complication: Secondary | ICD-10-CM | POA: Diagnosis not present

## 2022-02-03 DIAGNOSIS — Z20822 Contact with and (suspected) exposure to covid-19: Secondary | ICD-10-CM | POA: Diagnosis not present

## 2022-02-03 DIAGNOSIS — U071 COVID-19: Secondary | ICD-10-CM | POA: Diagnosis not present

## 2022-02-10 ENCOUNTER — Other Ambulatory Visit: Payer: Self-pay | Admitting: Internal Medicine

## 2022-02-18 DIAGNOSIS — H43813 Vitreous degeneration, bilateral: Secondary | ICD-10-CM | POA: Diagnosis not present

## 2022-02-18 DIAGNOSIS — E119 Type 2 diabetes mellitus without complications: Secondary | ICD-10-CM | POA: Diagnosis not present

## 2022-02-18 DIAGNOSIS — H25813 Combined forms of age-related cataract, bilateral: Secondary | ICD-10-CM | POA: Diagnosis not present

## 2022-03-21 ENCOUNTER — Other Ambulatory Visit: Payer: Self-pay | Admitting: Internal Medicine

## 2022-03-21 NOTE — Telephone Encounter (Signed)
Prescription refill request for Eliquis received. Indication:Afib Last office visit:2/23 Scr:1.4 Age: 76 Weight:111.9 kg  Prescription refilled

## 2022-04-15 DIAGNOSIS — B028 Zoster with other complications: Secondary | ICD-10-CM | POA: Diagnosis not present

## 2022-04-15 DIAGNOSIS — E1169 Type 2 diabetes mellitus with other specified complication: Secondary | ICD-10-CM | POA: Diagnosis not present

## 2022-04-15 DIAGNOSIS — B0231 Zoster conjunctivitis: Secondary | ICD-10-CM | POA: Diagnosis not present

## 2022-04-16 DIAGNOSIS — B0239 Other herpes zoster eye disease: Secondary | ICD-10-CM | POA: Diagnosis not present

## 2022-04-29 DIAGNOSIS — B0239 Other herpes zoster eye disease: Secondary | ICD-10-CM | POA: Diagnosis not present

## 2022-04-29 DIAGNOSIS — E119 Type 2 diabetes mellitus without complications: Secondary | ICD-10-CM | POA: Diagnosis not present

## 2022-04-29 DIAGNOSIS — H25813 Combined forms of age-related cataract, bilateral: Secondary | ICD-10-CM | POA: Diagnosis not present

## 2022-04-29 DIAGNOSIS — H43813 Vitreous degeneration, bilateral: Secondary | ICD-10-CM | POA: Diagnosis not present

## 2022-05-30 DIAGNOSIS — R251 Tremor, unspecified: Secondary | ICD-10-CM | POA: Diagnosis not present

## 2022-05-30 DIAGNOSIS — M4316 Spondylolisthesis, lumbar region: Secondary | ICD-10-CM | POA: Diagnosis not present

## 2022-05-30 DIAGNOSIS — Z6831 Body mass index (BMI) 31.0-31.9, adult: Secondary | ICD-10-CM | POA: Diagnosis not present

## 2022-06-04 ENCOUNTER — Encounter: Payer: Self-pay | Admitting: Neurology

## 2022-06-10 ENCOUNTER — Ambulatory Visit
Admission: RE | Admit: 2022-06-10 | Discharge: 2022-06-10 | Disposition: A | Discharge status: Home / Self Care | Payer: Medicare PPO | Source: Ambulatory Visit | Attending: Internal Medicine | Admitting: Internal Medicine

## 2022-06-10 DIAGNOSIS — R911 Solitary pulmonary nodule: Secondary | ICD-10-CM

## 2022-06-13 ENCOUNTER — Encounter: Payer: Self-pay | Admitting: Internal Medicine

## 2022-06-13 ENCOUNTER — Other Ambulatory Visit: Payer: Medicare PPO

## 2022-06-13 DIAGNOSIS — E785 Hyperlipidemia, unspecified: Secondary | ICD-10-CM | POA: Diagnosis not present

## 2022-06-13 DIAGNOSIS — E1169 Type 2 diabetes mellitus with other specified complication: Secondary | ICD-10-CM | POA: Diagnosis not present

## 2022-06-13 DIAGNOSIS — R7989 Other specified abnormal findings of blood chemistry: Secondary | ICD-10-CM | POA: Diagnosis not present

## 2022-06-13 DIAGNOSIS — I25118 Atherosclerotic heart disease of native coronary artery with other forms of angina pectoris: Secondary | ICD-10-CM | POA: Diagnosis not present

## 2022-06-13 DIAGNOSIS — D6869 Other thrombophilia: Secondary | ICD-10-CM | POA: Diagnosis not present

## 2022-06-13 DIAGNOSIS — Z951 Presence of aortocoronary bypass graft: Secondary | ICD-10-CM | POA: Diagnosis not present

## 2022-06-13 DIAGNOSIS — I10 Essential (primary) hypertension: Secondary | ICD-10-CM | POA: Diagnosis not present

## 2022-06-13 DIAGNOSIS — I4891 Unspecified atrial fibrillation: Secondary | ICD-10-CM | POA: Diagnosis not present

## 2022-07-12 NOTE — Progress Notes (Unsigned)
Assessment/Plan:   ***  Subjective:   Oscar [name] was seen today in the movement disorders clinic for neurologic consultation at the request of Sueanne Margarita, DO.  The consultation is for the evaluation of tremor.  Tremor: {yes no:314532}   How long has it been going on? ***  At rest or with activation?  ***  When is it noted the most?  ***  Fam hx of tremor?  {yes AY:301601}  Located where?  ***  Affected by caffeine:  {yes no:314532}  Affected by alcohol:  {yes no:314532}  Affected by stress:  {yes no:314532}  Affected by fatigue:  {yes no:314532}  Spills soup if on spoon:  {yes no:314532}  Spills glass of liquid if full:  {yes no:314532}  Affects ADL's (tying shoes, brushing teeth, etc):  {yes no:314532}  Tremor inducing meds:  Yes.   Amiodarone  Other Specific Symptoms:  Voice: *** Sleep: ***  Vivid Dreams:  {yes no:314532}  Acting out dreams:  {yes no:314532} Wet Pillows: {yes no:314532} Postural symptoms:  {yes no:314532}  Falls?  {yes no:314532} Bradykinesia symptoms: {parkinson brady:18041} Loss of smell:  {yes no:314532} Loss of taste:  {yes no:314532} Urinary Incontinence:  {yes no:314532} Difficulty Swallowing:  {yes no:314532} Handwriting, micrographia: {yes no:314532} Trouble with ADL's:  {yes no:314532}  Trouble buttoning clothing: {yes no:314532} Depression:  {yes no:314532} Memory changes:  {yes no:314532} Hallucinations:  {yes no:314532}  visual distortions: {yes no:314532} N/V:  {yes no:314532} Lightheaded:  {yes no:314532}  Syncope: {yes no:314532} Diplopia:  {yes no:314532} Dyskinesia:  {yes no:314532}  Neuroimaging of the brain has *** previously been performed.  It *** available for my review today.  PREVIOUS MEDICATIONS: {Parkinson's RX:18200}  ALLERGIES:   Allergies  Allergen Reactions   Yellow Fever Vaccine Hives    CURRENT MEDICATIONS:  Current Outpatient Medications  Medication Instructions   amiodarone (PACERONE) 200 mg,  Oral, Daily   apixaban (ELIQUIS) 5 MG TABS tablet TAKE ONE TABLET BY MOUTH TWICE DAILY   aspirin EC 81 mg, Oral, Every morning, Swallow whole.   atorvastatin (LIPITOR) 80 MG tablet TAKE ONE TABLET BY MOUTH ONE TIME DAILY  *keep february appointment for further refills*   cyanocobalamin (VITAMIN B12) 500 mcg, Oral, Every morning   furosemide (LASIX) 20 mg, Oral, Daily   halobetasol (ULTRAVATE) 0.93 % cream 1 application , Topical, 2 times daily PRN   hydrocortisone 2.5 % cream Topical, 2 times daily   losartan (COZAAR) 50 MG tablet Oral   Magnesium 250 mg, Every morning   metFORMIN (GLUCOPHAGE) 500-1,000 mg, Oral, See admin instructions, Take 1 tablet (500 mg) by mouth in the morning & take 2 tablets (1000 mg) by mouth in the afternoon.   metoprolol succinate (TOPROL XL) 12.5 mg, Oral, Daily at bedtime   omeprazole (PRILOSEC OTC) 20 mg, Daily PRN   potassium chloride SA (KLOR-CON M) 20 MEQ tablet TAKE ONE TABLET BY MOUTH ONE TIME DAILY    Objective:   PHYSICAL EXAMINATION:    VITALS:  There were no vitals filed for this visit.  GEN:  The patient appears stated age and is in NAD. HEENT:  Normocephalic, atraumatic.  The mucous membranes are moist. The superficial temporal arteries are without ropiness or tenderness. CV:  RRR Lungs:  CTAB Neck/HEME:  There are no carotid bruits bilaterally.  Neurological examination:  Orientation: The patient is alert and oriented x3.  Cranial nerves: There is good facial symmetry.  Extraocular muscles are intact. The visual fields are full to confrontational testing. The  speech is fluent and clear. Soft palate rises symmetrically and there is no tongue deviation. Hearing is intact to conversational tone. Sensation: Sensation is intact to light touch throughout (facial, trunk, extremities). Vibration is intact at the bilateral big toe. There is no extinction with double simultaneous stimulation.  Motor: Strength is 5/5 in the bilateral upper and lower  extremities.   Shoulder shrug is equal and symmetric.  There is no pronator drift. Deep tendon reflexes: Deep tendon reflexes are 2/4 at the bilateral biceps, triceps, brachioradialis, patella and achilles. Plantar responses are downgoing bilaterally.  Movement examination: Tone: There is ***tone in the bilateral upper extremities.  The tone in the lower extremities is ***.  Abnormal movements: *** Coordination:  There is *** decremation with RAM's, *** Gait and Station: The patient has *** difficulty arising out of a deep-seated chair without the use of the hands. The patient's stride length is ***.  The patient has a *** pull test.     I have reviewed and interpreted the following labs independently   Chemistry      Component Value Date/Time   NA 140 08/17/2021 1033   K 4.4 08/17/2021 1033   CL 100 08/17/2021 1033   CO2 21 08/17/2021 1033   BUN 14 08/17/2021 1033   CREATININE 1.48 (H) 08/17/2021 1033      Component Value Date/Time   CALCIUM 9.8 08/17/2021 1033   ALKPHOS 89 08/17/2021 1033   AST 20 08/17/2021 1033   ALT 16 08/17/2021 1033   BILITOT 0.9 08/17/2021 1033      Lab Results  Component Value Date   TSH 3.460 08/17/2021   Lab Results  Component Value Date   WBC 5.4 07/21/2021   HGB 9.0 (L) 07/21/2021   HCT 27.6 (L) 07/21/2021   MCV 93.9 07/21/2021   PLT 165 07/21/2021      Total time spent on today's visit was ***greater than 60 minutes, including both face-to-face time and nonface-to-face time.  Time included that spent on review of records (prior notes available to me/labs/imaging if pertinent), discussing treatment and goals, answering patient's questions and coordinating care.  Cc:  Sueanne Margarita, DO

## 2022-07-16 ENCOUNTER — Encounter: Payer: Self-pay | Admitting: Neurology

## 2022-07-16 ENCOUNTER — Ambulatory Visit: Payer: Medicare PPO | Admitting: Neurology

## 2022-07-16 VITALS — BP 98/60 | HR 60 | Ht 72.0 in | Wt 233.4 lb

## 2022-07-16 DIAGNOSIS — G253 Myoclonus: Secondary | ICD-10-CM | POA: Diagnosis not present

## 2022-07-16 NOTE — Patient Instructions (Signed)
I didn't see any tremor today, although we discussed that amiodarone can contribute to tremor.  I did see some myoclonus.  This is likely due to renal insufficiency (kidneys slowing down) but I don't have updated labs.  We will try to get a copy.  We will also do an Mri brain.  Remember that we said that this will likely show that the brain has shrunk some and that there will be some hardening of the arteries in the brain from high blood pressure, diabetes, high cholesterol, etc.  A referral to [facility] has been placed for your MRI someone will contact you directly to schedule your appt. They are located at [place]. Please contact them directly by calling [phone] with any questions regarding your referral.   The physicians and staff at [facility] are committed to providing excellent care. You may receive a survey requesting feedback about your experience at our office. We strive to receive "very good" responses to the survey questions. If you feel that your experience would prevent you from giving the office a "very good " response, please contact our office to try to remedy the situation. We may be reached at [phone]. Thank you for taking the time out of your busy day to complete the survey.

## 2022-08-01 ENCOUNTER — Ambulatory Visit
Admission: RE | Admit: 2022-08-01 | Discharge: 2022-08-01 | Disposition: A | Discharge status: Home / Self Care | Payer: Medicare PPO | Source: Ambulatory Visit | Attending: Neurology | Admitting: Neurology

## 2022-08-01 DIAGNOSIS — G253 Myoclonus: Secondary | ICD-10-CM

## 2022-08-01 DIAGNOSIS — R251 Tremor, unspecified: Secondary | ICD-10-CM | POA: Diagnosis not present

## 2022-08-01 DIAGNOSIS — G319 Degenerative disease of nervous system, unspecified: Secondary | ICD-10-CM | POA: Diagnosis not present

## 2022-08-01 DIAGNOSIS — R9082 White matter disease, unspecified: Secondary | ICD-10-CM | POA: Diagnosis not present

## 2022-08-03 ENCOUNTER — Other Ambulatory Visit: Payer: Self-pay | Admitting: Internal Medicine

## 2022-08-12 DIAGNOSIS — B351 Tinea unguium: Secondary | ICD-10-CM | POA: Diagnosis not present

## 2022-08-12 DIAGNOSIS — I70203 Unspecified atherosclerosis of native arteries of extremities, bilateral legs: Secondary | ICD-10-CM | POA: Diagnosis not present

## 2022-08-12 DIAGNOSIS — M2011 Hallux valgus (acquired), right foot: Secondary | ICD-10-CM | POA: Diagnosis not present

## 2022-08-12 DIAGNOSIS — G629 Polyneuropathy, unspecified: Secondary | ICD-10-CM | POA: Diagnosis not present

## 2022-08-12 DIAGNOSIS — M2012 Hallux valgus (acquired), left foot: Secondary | ICD-10-CM | POA: Diagnosis not present

## 2022-08-12 DIAGNOSIS — E1142 Type 2 diabetes mellitus with diabetic polyneuropathy: Secondary | ICD-10-CM | POA: Diagnosis not present

## 2022-08-19 ENCOUNTER — Other Ambulatory Visit: Payer: Self-pay | Admitting: Internal Medicine

## 2022-08-19 DIAGNOSIS — B351 Tinea unguium: Secondary | ICD-10-CM | POA: Diagnosis not present

## 2022-09-02 DIAGNOSIS — E1142 Type 2 diabetes mellitus with diabetic polyneuropathy: Secondary | ICD-10-CM | POA: Diagnosis not present

## 2022-09-02 DIAGNOSIS — B351 Tinea unguium: Secondary | ICD-10-CM | POA: Diagnosis not present

## 2022-09-02 DIAGNOSIS — M2011 Hallux valgus (acquired), right foot: Secondary | ICD-10-CM | POA: Diagnosis not present

## 2022-09-02 DIAGNOSIS — I70203 Unspecified atherosclerosis of native arteries of extremities, bilateral legs: Secondary | ICD-10-CM | POA: Diagnosis not present

## 2022-09-02 DIAGNOSIS — M2012 Hallux valgus (acquired), left foot: Secondary | ICD-10-CM | POA: Diagnosis not present

## 2022-09-02 DIAGNOSIS — G629 Polyneuropathy, unspecified: Secondary | ICD-10-CM | POA: Diagnosis not present

## 2022-09-08 ENCOUNTER — Other Ambulatory Visit: Payer: Self-pay | Admitting: Internal Medicine

## 2022-09-21 ENCOUNTER — Other Ambulatory Visit: Payer: Self-pay | Admitting: Internal Medicine

## 2022-09-23 NOTE — Telephone Encounter (Addendum)
Prescription refill request for Eliquis received. Indication:  PAF Last office visit: 12/10/21  E Monge NP Scr: 1.48 on 08/17/21 Age: 76 Weight: 111.9kg  Based on above findings Eliquis '5mg'$  twice daily is the appropriate dose.  Refill approved. BMP due and requested at upcoming appt with Dr Debara Pickett.

## 2022-10-06 ENCOUNTER — Other Ambulatory Visit (HOSPITAL_COMMUNITY): Payer: Self-pay | Admitting: Nurse Practitioner

## 2022-10-22 DIAGNOSIS — I25118 Atherosclerotic heart disease of native coronary artery with other forms of angina pectoris: Secondary | ICD-10-CM | POA: Diagnosis not present

## 2022-10-22 DIAGNOSIS — B029 Zoster without complications: Secondary | ICD-10-CM | POA: Diagnosis not present

## 2022-10-22 DIAGNOSIS — D6869 Other thrombophilia: Secondary | ICD-10-CM | POA: Diagnosis not present

## 2022-10-22 DIAGNOSIS — E1169 Type 2 diabetes mellitus with other specified complication: Secondary | ICD-10-CM | POA: Diagnosis not present

## 2022-10-22 DIAGNOSIS — Z951 Presence of aortocoronary bypass graft: Secondary | ICD-10-CM | POA: Diagnosis not present

## 2022-10-22 DIAGNOSIS — E785 Hyperlipidemia, unspecified: Secondary | ICD-10-CM | POA: Diagnosis not present

## 2022-10-22 DIAGNOSIS — I4891 Unspecified atrial fibrillation: Secondary | ICD-10-CM | POA: Diagnosis not present

## 2022-10-22 DIAGNOSIS — I10 Essential (primary) hypertension: Secondary | ICD-10-CM | POA: Diagnosis not present

## 2022-10-22 DIAGNOSIS — M109 Gout, unspecified: Secondary | ICD-10-CM | POA: Diagnosis not present

## 2022-11-20 ENCOUNTER — Encounter: Payer: Self-pay | Admitting: Internal Medicine

## 2022-11-20 ENCOUNTER — Ambulatory Visit: Payer: Medicare PPO | Attending: Internal Medicine | Admitting: Internal Medicine

## 2022-11-20 VITALS — BP 106/70 | HR 64 | Ht 72.0 in | Wt 238.4 lb

## 2022-11-20 DIAGNOSIS — Z79899 Other long term (current) drug therapy: Secondary | ICD-10-CM

## 2022-11-20 DIAGNOSIS — E785 Hyperlipidemia, unspecified: Secondary | ICD-10-CM

## 2022-11-20 DIAGNOSIS — Z951 Presence of aortocoronary bypass graft: Secondary | ICD-10-CM | POA: Diagnosis not present

## 2022-11-20 DIAGNOSIS — D6869 Other thrombophilia: Secondary | ICD-10-CM

## 2022-11-20 DIAGNOSIS — I2510 Atherosclerotic heart disease of native coronary artery without angina pectoris: Secondary | ICD-10-CM | POA: Diagnosis not present

## 2022-11-20 DIAGNOSIS — I480 Paroxysmal atrial fibrillation: Secondary | ICD-10-CM | POA: Diagnosis not present

## 2022-11-20 NOTE — Progress Notes (Signed)
OFFICE NOTE  Chief Complaint:  Follow-up CABG, afib  Primary Care Physician: Sueanne Margarita, DO  HPI:  Oscar [name] is a 77 y.o. male who is a history of moderate obesity, dyslipidemia, hypertension and type 2 diabetes. There is a strong family history of heart disease with his father who died of a massive heart attack at age 71 and a brother who died of an MI at age 4. Recently he's noted some increase in palpitations. He saw his primary care provider who did EKGs and noted he was having PACs. He was started on metoprolol tartrate 25 mg twice a day. He reports marked improvement in his symptoms over his EKG still shows PACs. Heart rate however is lower and blood pressure which was in the 656C and 127 systolic is now 517/00 in the office today. [name] denies any chest pain but notes that he does get short of breath and sweats very easily with minimal exertion. Although his overweight his weight is been elevated for some time and not necessarily increasing to explain the new change in his symptoms. He's felt more fatigued recently as well. Of note he previously seen [facility] in the early 2000's. He underwent heart catheterization 2002, which showed normal LV systolic function and three-vessel nonobstructive coronary disease. He was noted to have a 30% ostial LAD stenosis, a 50% first diagonal branch stenosis, and apical 70% LAD stenosis. The left circumflex had a 40% stenosis in the mid vessel and diffuse 20% distal stenosis. Right coronary artery was codominant and noted to have 50% diffuse stenosis in the mid vessel and a tubular 60% distal stenosis. Medical therapy was recommended. EKG today shows sinus rhythm with PACs and left anterior fascicular block. There was voltage criteria for LVH and lateral T-wave inversions concerning for ischemia.  07/24/2016  [name] returns today for follow-up of his studies. He underwent nuclear stress testing and ultimately had an echocardiogram due  to abnormal nuclear findings. Nuclear stress test showed no reversible ischemia and a fixed basal inferior defect concerning for probable artifact. LVEF was decreased at 42% however the nuclear readers suggested that the EF appeared to be normal. Subsequent echocardiography showed normal LV function without regional wall motion abnormalities and mild diastolic dysfunction. I shared those results of [name] today and feel that they're fairly reassuring. He's not describing chest pain but does get short of breath and sweats easily with minimal exertion. This could be weight related. He does note improvement with metoprolol with regards to his PACs. He says he no longer has any palpitations. He is on optimal medical therapy for diabetes, cholesterol and is on daily aspirin.  01/21/2017  [name] was seen today in follow-up. He denies any worsening shortness of breath or chest pain. His EKG is unchanged showing persistent T-wave inversions. Echo which showed mild diastolic dysfunction with normal LVEF. I've encouraged him to work on exercise and weight loss however there is not been a lot of that. He recently played golf the other weekend said that he was able to walk around without any difficulty but then had to walk several blocks to go to dinner and became a little short of breath. He reports his blood sugars been well controlled with hemoglobin A1c is 6. Cholesterol is at goal as well. He takes only half tablet of Lasix for some intermittent swelling of his legs mostly related to venous hypertension.  03/02/2018  [name] returns today for follow-up.  Overall he is done  well.  He denies any worsening chest pain or shortness of breath.  He has been playing some golf.  Recently has been having problems with his back.  He is followed by Dr. Sherwood Gambler.  He has had some leg pain which is unclear if it is related to his back or peripheral arterial disease.  He reports cool feet and has answered yes to some notable  lower extremity hair loss.  He has a history of hypertension and dyslipidemia as described.  His EKG actually looks somewhat better today, but does show sinus rhythm with PACs, LVH and nonspecific T wave changes laterally.  Lab work is pending from his PCP, however he said his hemoglobin A1c was 6.3 on metformin.  05/30/2020  [name] seen today in follow-up.  Is been 2 years since I last saw him.  He has followed up with his PCP back in June.  The time his A1c was 7.9.  Cholesterol was assessed in January.  Total was 172, HDL 37, triglycerides 115 and LDL 112.  His target LDL should probably be less than 70 given diabetes and known CAD by cath in 2002.  He is on pravastatin.  We discussed the possibility of adding ezetimibe or more aggressive dietary invention and weight loss.  It does appear that he is down a few pounds since his visit in June.  He has some difficulty exercising due to back issues.  He recently was started on vitamin B12 with some improvement in what is thought to be peripheral neuropathy as he is described "cool feet" but had essentially normal lower extremity arterial Dopplers in 2019.  08/21/2020  [name] seen today for follow-up.  He has reported some progressive shortness of breath and fatigue. He occasionally gets tingling in his left arm and left anterior chest wall, sometimes worse with exertion.  Blood pressure was elevated today however at 161/84.  He reports home blood pressure readings are better.  EKG today shows a sinus rhythm with some PACs.  He denies any palpitations or arrhythmias.  He had cholesterol testing in January 2021 showed total cholesterol 172, HDL 37, LDL 112 and triglycerides 115.  Hemoglobin A1c as of June was 7.9.  He is working with his PCP on his blood sugars.  LDL still remains above target.  He has ongoing complaints of cool feet which feel warm but are subjectively cool and has had diagnosis of neuropathy.  He also had lower extremity arterial Dopplers  in the past which were normal.  10/09/2020  [name] returns today for follow-up.  He underwent stress testing on November 16.  There was a small inferior attenuation artifact which improved with upright imaging.  This is more consistent with bowel shadowing.  No ischemia was noted.  EF was at 49% however an echo in the past that showed a low normal EF around 50%.  He really denies any shortness of breath.  He had been complaining of left arm symptoms really concerning for a radiculopathy.  He saw Dr.Kuzma who did nerve conduction testing and from what he explained to me it sounds like he may have left ulnar nerve entrapment at the elbow and may need surgery.  From a cardiac standpoint he is at acceptable risk for this.  06/22/2021  Mr. Normington is seen today as an urgent follow-up.  He called in the office with concerns about worsening shortness of breath and chest pressure.  This is developed over the past week or so.  He  did have COVID-19 I think in July.  He said he did recover fully from that but the symptoms developed afterwards.  He has had chest discomfort symptoms before.  He had some known mild coronary disease by catheter remotely in 2002 but did have a family history of early onset heart disease and has numerous cardiovascular risk factors including obesity, diabetes, dyslipidemia and hypertension.  He denies any particular chest pain.  EKG today actually shows a normal sinus rhythm with left anterior fascicular block at 67.  Blood pressure is excellent 118/72.  Recent lab work included a lipid profile showing total cholesterol 173, HDL 41, LDL 113 and triglycerides 97.  A1c of 6.2, liver enzymes were normal.  07/05/2021  Mr. Lares returns for follow-up.  He was seen just a few weeks ago for progressive dyspnea on exertion and chest pressure.  This had presented and worsened over the past week.  He thought it was related initially to COVID-19 but he did recover somewhat from the symptoms.  I referred  him for coronary CT angiogram which was performed on 06/28/2021.  Image quality was felt to be suboptimal however the calcium score was significantly elevated at 2381.  97th percentile for age and sex matched controls.  Coronary arteries were thought to be diffusely diseased with severe stenosis in the right coronary artery, LAD and circumflex arteries-CAD RADS 4B.  Based on these findings, cardiac catheterization was recommended.  I reviewed the images and I agree with this recommendation.  I discussed this with the patient and his wife today as well as his daughter via teleconference.  We did discuss the procedure which will likely be radial catheterization at length including the risk, benefits and alternatives today and he is agreeable to proceed.  08/17/2021  Mr. Bolger returns today for follow-up.  He underwent fairly urgent cardiac catheterization for symptoms concerning for unstable angina.  He had a coronary CT angiogram which showed significant calcium over 2000 and probable severe stenosis in the right coronary artery LAD and circumflex arteries.  Cath did demonstrate severe three-vessel coronary disease.  He was referred for coronary artery bypass grafting.  He underwent CABG x5 on 07/16/2021 with LIMA to LAD, SVG to diagonal, SVG to PDA and sequential SVG to OM 2 and OM 3.  Overall he tolerated this well however developed atrial fibrillation perioperatively.  He was treated with amiodarone and developed atrial flutter for which he had rapid atrial pacing but did not convert him.  He then went in and out of atrial fibrillation but ultimately converted back to a sinus rhythm.  He was recently seen in follow-up and his amiodarone was decreased further to 200 mg daily.  He also was maintained on digoxin.  Weight has been pretty stable at around 247 pounds at home.  He reports some fatigue but is generally improving.  11/20/2022  Mr. Kroft is seen today in follow-up.  He seems to be doing well without any  recurrent A-fib.  He has continued concerns about discomfort at his sternotomy scar site including tenderness and intermittent shooting pains.  He denies any chest pain.  Blood pressure was low normal today but he has no dizziness or orthostatic symptoms.  PMHx:  Past Medical History:  Diagnosis Date   Anesthesia complication    PER PT, "HARD TO WAKE' UP PAST SHOULDER SURGERY IN 2016   Arthritis    lumbar, spondylosis, DDD,stenosis    Atrial premature depolarization    Chronic right hip pain  and right leg pain   Coronary artery disease    Diabetes mellitus without complication ([place])    Dysrhythmia    pt. reports that he has been told in the past that he has  irreg. heattbeat    Edema    Gout    bil ankles   Hyperlipidemia    Hypertension    Rotator cuff tear    right   Shortness of breath    Swelling of extremity    bil feet   Type 2 diabetes mellitus with hyperglycemia [facility])     Past Surgical History:  Procedure Laterality Date   BACK SURGERY     January 2014   CARDIAC CATHETERIZATION     2004, outcome good, to be followed for/with medical therapy    CORONARY ARTERY BYPASS GRAFT N/A 07/16/2021   Procedure: CORONARY ARTERY BYPASS GRAFTING (CABG) X FIVE, USING LEFT INTERNAL MAMMARY ARTERY AND RIGHT LEG GREATER SAPHENOUS VEIN HARVESTED ENDOSCOPICALLY;  Surgeon: Melrose Nakayama, MD;  Location: [facility];  Service: Open Heart Surgery;  Laterality: N/A;   ELBOW BURSA SURGERY     removed bursacs both elbows   ENDOVEIN HARVEST OF GREATER SAPHENOUS VEIN  07/16/2021   Procedure: ENDOVEIN HARVEST OF GREATER SAPHENOUS VEIN;  Surgeon: Melrose Nakayama, MD;  Location: [facility] OR;  Service: Open Heart Surgery;;   FASCIECTOMY Left 12/12/2020   Procedure: FASCIECTOMY LEFT SMALL FINGER;  Surgeon: Daryll Brod, MD;  Location: [facility];  Service: Orthopedics;  Laterality: Left;   LEFT HEART CATH AND CORONARY ANGIOGRAPHY N/A 07/12/2021   Procedure: LEFT HEART CATH AND CORONARY  ANGIOGRAPHY;  Surgeon: Burnell Blanks, MD;  Location: [facility] LAB;  Service: Cardiovascular;  Laterality: N/A;   SHOULDER ARTHROSCOPY WITH ROTATOR CUFF REPAIR AND SUBACROMIAL DECOMPRESSION Right 08/21/2015   Procedure: SHOULDER ARTHROSCOPY WITH ROTATOR CUFF REPAIR S UBACROMIAL DECOMPRESSION, ;  Surgeon: Tania Ade, MD;  Location: [facility];  Service: Orthopedics;  Laterality: Right;  Right shoulder arthroscopy rotator cuff repair vs debridement, subacromial decompression, possible biceps tenotomy.   TEE WITHOUT CARDIOVERSION N/A 07/16/2021   Procedure: TRANSESOPHAGEAL ECHOCARDIOGRAM (TEE);  Surgeon: Melrose Nakayama, MD;  Location: [facility];  Service: Open Heart Surgery;  Laterality: N/A;    FAMHx:  Family History  Problem Relation Age of Onset   Heart disease Father    Diabetes Father    Heart disease Brother    Diabetes Paternal Grandmother    Colon cancer Neg Hx    Esophageal cancer Neg Hx    Rectal cancer Neg Hx    Stomach cancer Neg Hx     SOCHx:   reports that he quit smoking about 49 years ago. His smoking use included cigarettes. He has a 3.00 pack-year smoking history. He has never used smokeless tobacco. He reports that he does not currently use alcohol. He reports that he does not use drugs.  ALLERGIES:  Allergies  Allergen Reactions   Yellow Fever Vaccine Hives    ROS: Pertinent items noted in HPI and remainder of comprehensive ROS otherwise negative.  HOME MEDS: Current Outpatient Medications  Medication Sig Dispense Refill   amiodarone (PACERONE) 200 MG tablet TAKE ONE TABLET BY MOUTH ONE TIME DAILY 90 tablet 0   apixaban (ELIQUIS) 5 MG TABS tablet TAKE ONE TABLET BY MOUTH TWICE DAILY 180 tablet 1   aspirin EC 81 MG tablet Take 81 mg by mouth in the morning. Swallow whole.     atorvastatin (LIPITOR) 80 MG  tablet TAKE ONE TABLET BY MOUTH ONE TIME DAILY 90 tablet 0   furosemide (LASIX) 20 MG tablet TAKE ONE TABLET BY MOUTH ONE  TIME DAILY 90 tablet 0   halobetasol (ULTRAVATE) 0.05 % cream Apply 1 application topically 2 (two) times daily as needed (psoriasis).     hydrocortisone 2.5 % cream Apply topically 2 (two) times daily. (Patient taking differently: Apply 1 application  topically 2 (two) times daily as needed (psoriasis).) 30 g 4   losartan (COZAAR) 50 MG tablet Take by mouth.     Magnesium 250 MG TABS Take 250 mg by mouth in the morning.     metFORMIN (GLUCOPHAGE) 500 MG tablet Take 500-1,000 mg by mouth See admin instructions. Take 1 tablet (500 mg) by mouth in the morning & take 2 tablets (1000 mg) by mouth in the afternoon.     metoprolol succinate (TOPROL-XL) 25 MG 24 hr tablet take one-half tablet by mouth at bedtime 30 tablet 11   omeprazole (PRILOSEC OTC) 20 MG tablet Take 20 mg by mouth daily as needed (indigestion/heartburn).     potassium chloride SA (KLOR-CON M) 20 MEQ tablet TAKE ONE TABLET BY MOUTH ONE TIME DAILY 90 tablet 0   vitamin B-12 (CYANOCOBALAMIN) 500 MCG tablet Take 500 mcg by mouth in the morning.     No current facility-administered medications for this visit.   Facility-Administered Medications Ordered in Other Visits  Medication Dose Route Frequency Provider Last Rate Last Admin   technetium tetrofosmin (TC-MYOVIEW) injection 75.1 millicurie  02.5 millicurie Intravenous Once PRN Lelon Perla, MD        LABS/IMAGING: No results found for this or any previous visit (from the past 48 hour(s)). No results found.  WEIGHTS: Wt Readings from Last 3 Encounters:  11/20/22 238 lb 6.4 oz (108.1 kg)  07/16/22 233 lb 6.4 oz (105.9 kg)  12/10/21 246 lb 9.6 oz (111.9 kg)    VITALS: BP 106/70   Pulse 64   Ht 6' (1.829 m)   Wt 238 lb 6.4 oz (108.1 kg)   SpO2 97%   BMI 32.33 kg/m   EXAM: General appearance: alert and no distress Neck: no carotid bruit, no JVD, and thyroid not enlarged, symmetric, no tenderness/mass/nodules Lungs: clear to auscultation bilaterally Heart: regular  rate and rhythm, S1, S2 normal, no murmur, click, rub or gallop and well-healed midline scar Abdomen: soft, non-tender; bowel sounds normal; no masses,  no organomegaly Extremities: extremities normal, atraumatic, no cyanosis or edema Pulses: 2+ and symmetric Skin: Skin color, texture, turgor normal. No rashes or lesions Neurologic: Grossly normal Psych: Pleasant  EKG: Sinus rhythm first-degree AV block at 64, left anterior fascicular block, nonspecific T wave changes-personally reviewed  ASSESSMENT: CAD status post CABG x5 with LIMA to LAD, SVG to diagonal, SVG to PDA and sequential SVG to OM 2 and OM 3 (07/16/2021) Abnormal coronary CT angiogram with calcium score 2381, 97th percentile and three-vessel coronary disease (06/28/2021). Chest pressure, dyspnea on exertion -low risk Myoview stress test (09/2020) Low risk Myoview stress test and normal LVEF by echo 60-65% (2017) PVCs and palpitations Abnormal EKG with T-wave inversions History of moderate nonobstructive coronary artery disease by cath in 2002 Strong family history of premature coronary disease in his father and brother who died of MIs in their 18s Type 2 diabetes Dyslipidemia Hypertension Perioperative atrial fibrillation.  PLAN: 1.   Mr. Weinkauf seems to be doing well now more than a year out from bypass surgery.  He still gets numbness  and tingling around his surgical site and is noted to have keloid of the sternotomy scar.  This is likely why he is having symptoms.  Options for management of this are limited to either Silastic topical gel, injections of steroids or some other more topical treatments.  I offered to refer him to dermatology but he declined.  He does not seem to have had any further atrial fibrillation.  He remains on low-dose amiodarone.  I would like to decrease that to 100 mg daily for 2 weeks and if he remains without atrial fibrillation then would advise him to stop it.  He has had some easy bruising and  bleeding.  As he will be committed to long-term Eliquis, would advise stopping low-dose aspirin.  Will continue with his additional medications.  He is due for repeat lipid testing and will check a regular lipid profile, LP(a) as well as a metabolic profile given the fact that he is on anticoagulation and has not had recent check of this.  Follow-up with me annually or sooner as necessary.  Pixie Casino, MD, [facility], [place] Director of the Advanced Lipid Disorders &  Cardiovascular Risk Reduction Clinic Diplomate of the American Board of Clinical Lipidology Attending Cardiologist  Direct Dial: [phone]  Fax: [phone]  Website:  [email or web address] 11/20/2022, 3:56 PM

## 2022-11-20 NOTE — Patient Instructions (Signed)
Medication Instructions:  DECREASE amiodarone to '100mg'$  daily for two weeks If you feel OK and do not feel a recurrence of AFib, you can stop this.   STOP aspirin daily  *If you need a refill on your cardiac medications before your next appointment, please call your pharmacy*   Lab Work: Lipid Panel and BMET as soon as able You will need to be fasting   If you have labs (blood work) drawn today and your tests are completely normal, you will receive your results only by: [place] (if you have MyChart) OR A paper copy in the mail If you have any lab test that is abnormal or we need to change your treatment, we will call you to review the results.   Follow-Up: At [facility], you and your health needs are our priority.  As part of our continuing mission to provide you with exceptional heart care, we have created designated Provider Care Teams.  These Care Teams include your primary Cardiologist (physician) and Advanced Practice Providers (APPs -  Physician Assistants and Nurse Practitioners) who all work together to provide you with the care you need, when you need it.  We recommend signing up for the patient portal called "MyChart".  Sign up information is provided on this After Visit Summary.  MyChart is used to connect with patients for Virtual Visits (Telemedicine).  Patients are able to view lab/test results, encounter notes, upcoming appointments, etc.  Non-urgent messages can be sent to your provider as well.   To learn more about what you can do with MyChart, go to [email or web address].    Your next appointment:    12 months with Dr. Debara Pickett

## 2022-11-24 ENCOUNTER — Other Ambulatory Visit: Payer: Self-pay | Admitting: Internal Medicine

## 2022-12-03 ENCOUNTER — Other Ambulatory Visit: Payer: Self-pay | Admitting: Internal Medicine

## 2022-12-12 ENCOUNTER — Encounter (HOSPITAL_COMMUNITY): Payer: Self-pay | Admitting: *Deleted

## 2022-12-12 DIAGNOSIS — E538 Deficiency of other specified B group vitamins: Secondary | ICD-10-CM | POA: Diagnosis not present

## 2022-12-12 DIAGNOSIS — R002 Palpitations: Secondary | ICD-10-CM | POA: Diagnosis not present

## 2022-12-12 DIAGNOSIS — I10 Essential (primary) hypertension: Secondary | ICD-10-CM | POA: Diagnosis not present

## 2022-12-12 DIAGNOSIS — N529 Male erectile dysfunction, unspecified: Secondary | ICD-10-CM | POA: Diagnosis not present

## 2022-12-12 DIAGNOSIS — M109 Gout, unspecified: Secondary | ICD-10-CM | POA: Diagnosis not present

## 2022-12-12 DIAGNOSIS — E785 Hyperlipidemia, unspecified: Secondary | ICD-10-CM | POA: Diagnosis not present

## 2022-12-12 DIAGNOSIS — Z125 Encounter for screening for malignant neoplasm of prostate: Secondary | ICD-10-CM | POA: Diagnosis not present

## 2022-12-12 DIAGNOSIS — E1169 Type 2 diabetes mellitus with other specified complication: Secondary | ICD-10-CM | POA: Diagnosis not present

## 2022-12-16 DIAGNOSIS — B353 Tinea pedis: Secondary | ICD-10-CM | POA: Diagnosis not present

## 2022-12-16 DIAGNOSIS — E1142 Type 2 diabetes mellitus with diabetic polyneuropathy: Secondary | ICD-10-CM | POA: Diagnosis not present

## 2022-12-16 DIAGNOSIS — M2012 Hallux valgus (acquired), left foot: Secondary | ICD-10-CM | POA: Diagnosis not present

## 2022-12-16 DIAGNOSIS — B351 Tinea unguium: Secondary | ICD-10-CM | POA: Diagnosis not present

## 2022-12-16 DIAGNOSIS — M2011 Hallux valgus (acquired), right foot: Secondary | ICD-10-CM | POA: Diagnosis not present

## 2022-12-19 DIAGNOSIS — D6859 Other primary thrombophilia: Secondary | ICD-10-CM | POA: Diagnosis not present

## 2022-12-19 DIAGNOSIS — E785 Hyperlipidemia, unspecified: Secondary | ICD-10-CM | POA: Diagnosis not present

## 2022-12-19 DIAGNOSIS — Z0000 Encounter for general adult medical examination without abnormal findings: Secondary | ICD-10-CM | POA: Diagnosis not present

## 2022-12-19 DIAGNOSIS — R82998 Other abnormal findings in urine: Secondary | ICD-10-CM | POA: Diagnosis not present

## 2022-12-19 DIAGNOSIS — I10 Essential (primary) hypertension: Secondary | ICD-10-CM | POA: Diagnosis not present

## 2022-12-19 DIAGNOSIS — M109 Gout, unspecified: Secondary | ICD-10-CM | POA: Diagnosis not present

## 2022-12-19 DIAGNOSIS — I4891 Unspecified atrial fibrillation: Secondary | ICD-10-CM | POA: Diagnosis not present

## 2022-12-19 DIAGNOSIS — E1169 Type 2 diabetes mellitus with other specified complication: Secondary | ICD-10-CM | POA: Diagnosis not present

## 2022-12-19 DIAGNOSIS — I25118 Atherosclerotic heart disease of native coronary artery with other forms of angina pectoris: Secondary | ICD-10-CM | POA: Diagnosis not present

## 2022-12-19 DIAGNOSIS — Z23 Encounter for immunization: Secondary | ICD-10-CM | POA: Diagnosis not present

## 2023-01-04 ENCOUNTER — Other Ambulatory Visit: Payer: Self-pay | Admitting: Internal Medicine

## 2023-01-09 NOTE — Telephone Encounter (Signed)
Called patient re: amiodarone refill. He said he is feeling well, no recurrence of AFib that he is aware of. He did not, however, STOP amiodarone '100mg'$  QD after 2 weeks on this dose, as advised per Jan 2024 visit. He has continued to take. Informed him that he can stop this medication now, based on January 2024 MD note. He voiced understanding.

## 2023-01-15 DIAGNOSIS — I4891 Unspecified atrial fibrillation: Secondary | ICD-10-CM | POA: Diagnosis not present

## 2023-01-15 DIAGNOSIS — K5909 Other constipation: Secondary | ICD-10-CM | POA: Diagnosis not present

## 2023-01-15 DIAGNOSIS — K921 Melena: Secondary | ICD-10-CM | POA: Diagnosis not present

## 2023-02-24 DIAGNOSIS — E119 Type 2 diabetes mellitus without complications: Secondary | ICD-10-CM | POA: Diagnosis not present

## 2023-02-24 DIAGNOSIS — H43813 Vitreous degeneration, bilateral: Secondary | ICD-10-CM | POA: Diagnosis not present

## 2023-02-24 DIAGNOSIS — H25813 Combined forms of age-related cataract, bilateral: Secondary | ICD-10-CM | POA: Diagnosis not present

## 2023-03-08 ENCOUNTER — Other Ambulatory Visit: Payer: Self-pay | Admitting: Internal Medicine

## 2023-03-08 DIAGNOSIS — I480 Paroxysmal atrial fibrillation: Secondary | ICD-10-CM

## 2023-03-10 NOTE — Telephone Encounter (Signed)
Prescription refill request for Eliquis received. Indication: Afib  Last office visit: 11/20/22 ([place])  Scr: 1.9 (12/12/22 via PCP)  Age: 77 Weight: 108.1kg  Appropriate dose. Refill sent.

## 2023-03-29 IMAGING — CT CT HEART MORP W/ CTA COR W/ SCORE W/ CA W/CM &/OR W/O CM
4 of 7 series · 8 of 20 positions shown, 9 images · IV contrast (APPLIED)
Comparison: None.

Addendum:
CLINICAL DATA: 75M with CAD, hypertension, hyperlipidemia, diabetes
and family history of CAD with chest pain.

EXAM:
Cardiac/Coronary  CT
TECHNIQUE: The patient was scanned on a [employer] scanner.

[Series 6: best diast 70 % · axial · 0.37mm/px · z∈[+1192,+1230]mm · 2 of 287 slices shown, 3 images]
[im 96/287  vessel]
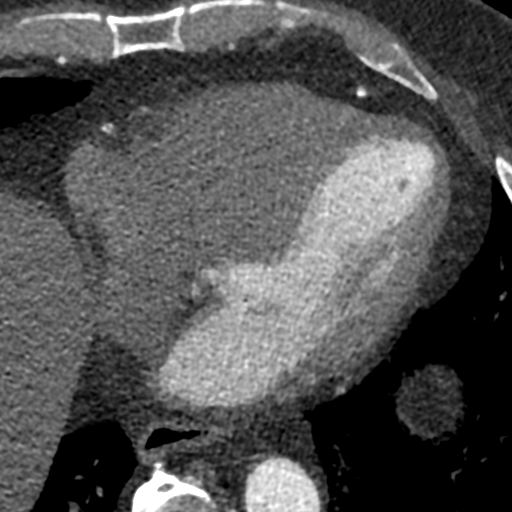
[im 96/287  lung]
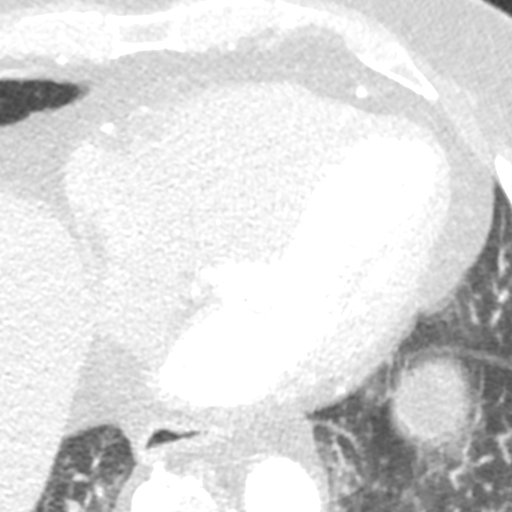
[im 191/287  vessel]
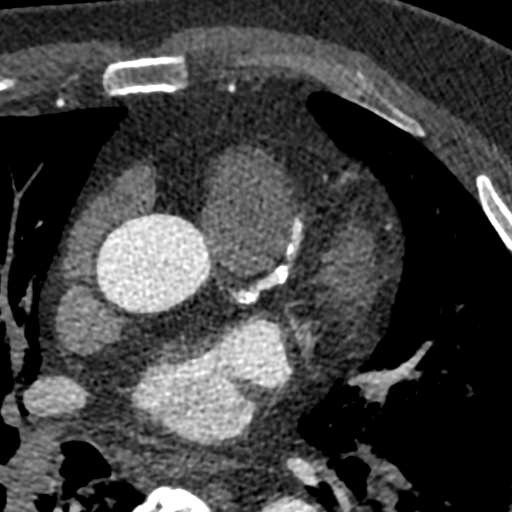

[Series 7: best syst 24 % · axial · 0.37mm/px · z∈[+1192,+1230]mm · 2 of 287 slices shown]
[im 96/287  vessel]
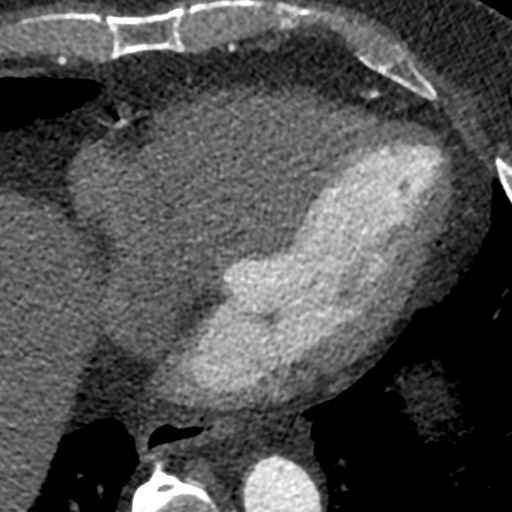
[im 191/287  vessel]
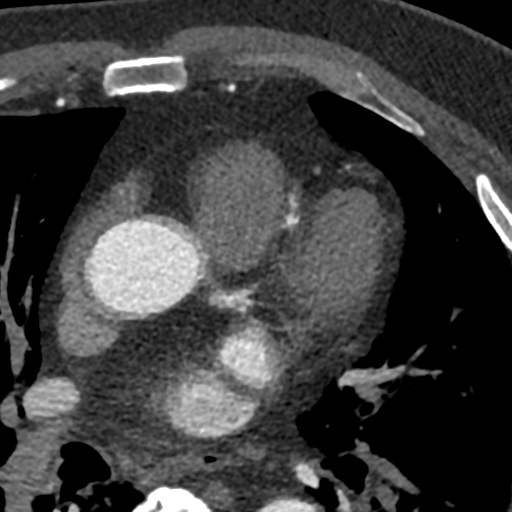

[Series 8: ts diast 70 % · axial · 0.37mm/px · z∈[+1192,+1230]mm · 2 of 287 slices shown]
[im 96/287  vessel]
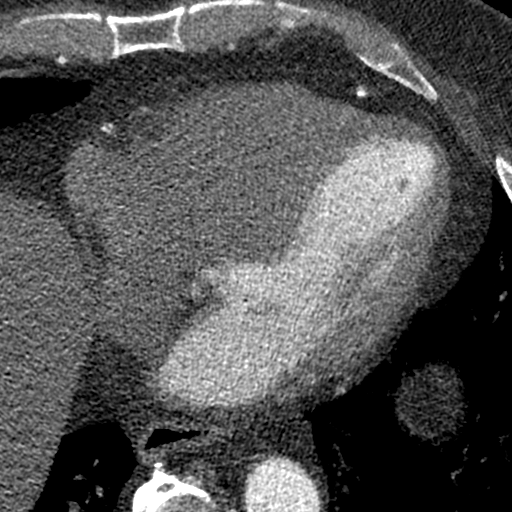
[im 191/287  vessel]
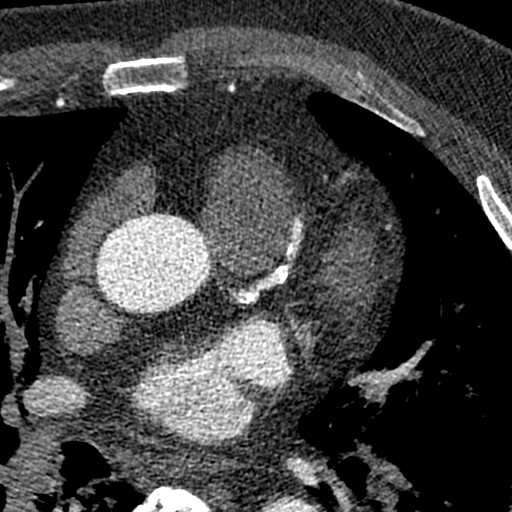

[Series 9: ts syst 24 % · axial · 0.37mm/px · z∈[+1192,+1230]mm · 2 of 287 slices shown]
[im 96/287  vessel]
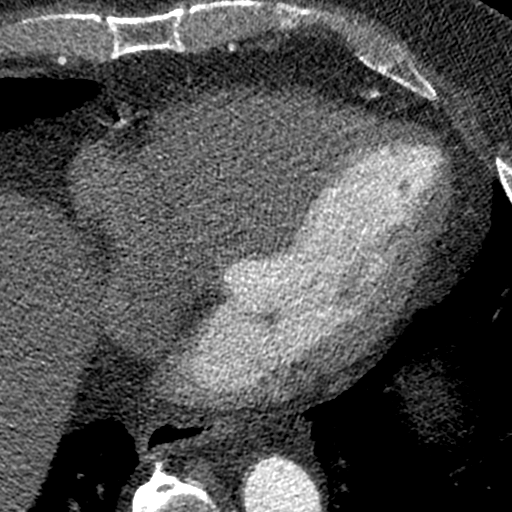
[im 191/287  vessel]
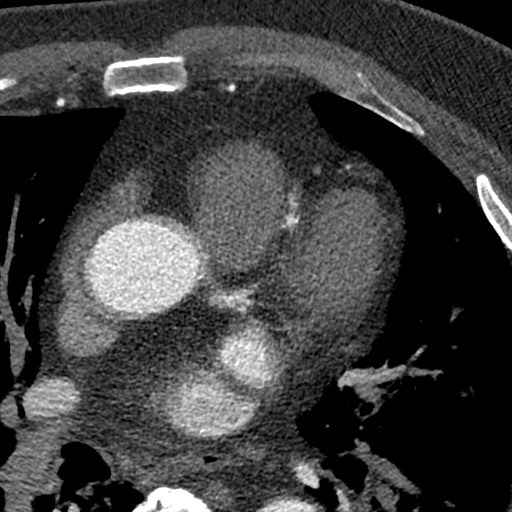

[8 of 20 positions shown; findings below may reference images not displayed]



Aorta: Normal size. Ascending aorta 3.5 cm. Mild calcification of
the aortic root. No dissection.

Aortic Valve:  Trileaflet.  No calcifications.

Coronary Arteries:  Normal coronary origin.  Right dominance.

RCA is a co-dominant artery that gives rise to PDA and PLVB.
Interpretation limited by slab reconstruction artifact. The RCA is
diffusely calcified with severe (>70%) calcified plaque in the mid
to distal RCA.

Left main is a large artery that gives rise to LAD and LCX arteries.
There is minimal (<25%) mixed plaque in the distal LCX.

LAD is a large vessel that is diffusely diseased. There is severe
(>70%) mixed plaque in the mid to distal LAD.

LCX is a large, co-dominant artery that is diffusely diseased. There
is moderate (50-69%) calcified plaque in the ostium and severe
(>70%) calcified plaque in the mid LCX. D1 is a large, diffusely
calcified vessel with severe (>70%) mixed plaque distal to the
branching point.

Coronary Calcium Score:

Left main: 199

Left anterior descending artery: 6730

Left circumflex artery: 232

Right coronary artery: 456

Total: 7961

Percentile: 97th

Other findings:

Normal pulmonary vein drainage into the left atrium.

Normal let atrial appendage without a thrombus.

Normal size of the pulmonary artery.
IMPRESSION: 1. Image quality is poor.

2. Coronary calcium score of 7961. This was 97th percentile for
age-, race-, and sex-matched controls.

3. Normal coronary origin with co-dominance.

4. The coronary arteries are diffusely diseased. There is severe
(>70%) stenosis in the RCA, LAD and LCX. CAD-RADS 4B.

5.  Recommend cardiac catheterization.

Interpretation of the non-cardiac thoracic structures by radiology
is pending.

EXAM:
OVER-READ INTERPRETATION  CT CHEST

The following report is an over-read performed by radiologist Dr.
over-read does not include interpretation of cardiac or coronary
anatomy or pathology. The coronary CTA interpretation by the
cardiologist is attached.
FINDINGS: No mediastinal mass or adenopathy.

There are dependent changes identified within both lung bases. No
pleural effusion, airspace consolidation or atelectasis. 4 mm nodule
is noted within the periphery of the posterior left upper lobe,
image [DATE].

No acute abnormality within the imaged portions of the upper
abdomen.

No acute or suspicious osseous findings.
IMPRESSION: 1. No acute abnormalities.
2. 4 mm nodule noted within the periphery of the posterior left
upper lobe. Nonspecific. No follow-up needed if patient is low-risk.
Non-contrast chest CT can be considered in 12 months if patient is
high-risk. This recommendation follows the consensus statement:
Guidelines for Management of Incidental Pulmonary Nodules Detected
[DATE].

*** End of Addendum ***
FINDINGS: A 120 kV prospective scan was triggered in the descending thoracic
aorta at 111 HU's. Axial non-contrast 3 mm slices were carried out
through the heart. The data set was analyzed on a dedicated work
station and scored using the Agatson method. Gantry rotation speed
was 250 msecs and collimation was .6 mm. No beta blockade and 0.8 mg
of sl NTG was given. The 3D data set was reconstructed in 5%
intervals of the 67-82 % of the R-R cycle. Diastolic phases were
analyzed on a dedicated work station using MPR, MIP and VRT modes.
The patient received 80 cc of contrast.

Aorta: Normal size. Ascending aorta 3.5 cm. Mild calcification of
the aortic root. No dissection.

Aortic Valve:  Trileaflet.  No calcifications.

Coronary Arteries:  Normal coronary origin.  Right dominance.

RCA is a co-dominant artery that gives rise to PDA and PLVB.
Interpretation limited by slab reconstruction artifact. The RCA is
diffusely calcified with severe (>70%) calcified plaque in the mid
to distal RCA.

Left main is a large artery that gives rise to LAD and LCX arteries.
There is minimal (<25%) mixed plaque in the distal LCX.

LAD is a large vessel that is diffusely diseased. There is severe
(>70%) mixed plaque in the mid to distal LAD.

LCX is a large, co-dominant artery that is diffusely diseased. There
is moderate (50-69%) calcified plaque in the ostium and severe
(>70%) calcified plaque in the mid LCX. D1 is a large, diffusely
calcified vessel with severe (>70%) mixed plaque distal to the
branching point.

Coronary Calcium Score:

Left main: 199

Left anterior descending artery: 6730

Left circumflex artery: 232

Right coronary artery: 456

Total: 7961

Percentile: 97th

Other findings:

Normal pulmonary vein drainage into the left atrium.

Normal let atrial appendage without a thrombus.

Normal size of the pulmonary artery.
IMPRESSION: 1. Image quality is poor.

2. Coronary calcium score of 7961. This was 97th percentile for
age-, race-, and sex-matched controls.

3. Normal coronary origin with co-dominance.

4. The coronary arteries are diffusely diseased. There is severe
(>70%) stenosis in the RCA, LAD and LCX. CAD-RADS 4B.

5.  Recommend cardiac catheterization.

Interpretation of the non-cardiac thoracic structures by radiology
is pending.

## 2023-06-12 IMAGING — CR DG CHEST 2V
2 series · 2 of 2 positions shown · non-contrast
Comparison: Comparison made with August 23, 2021.

CLINICAL DATA: Status post CABG.

EXAM:
CHEST - 2 VIEW

[w chest pa]
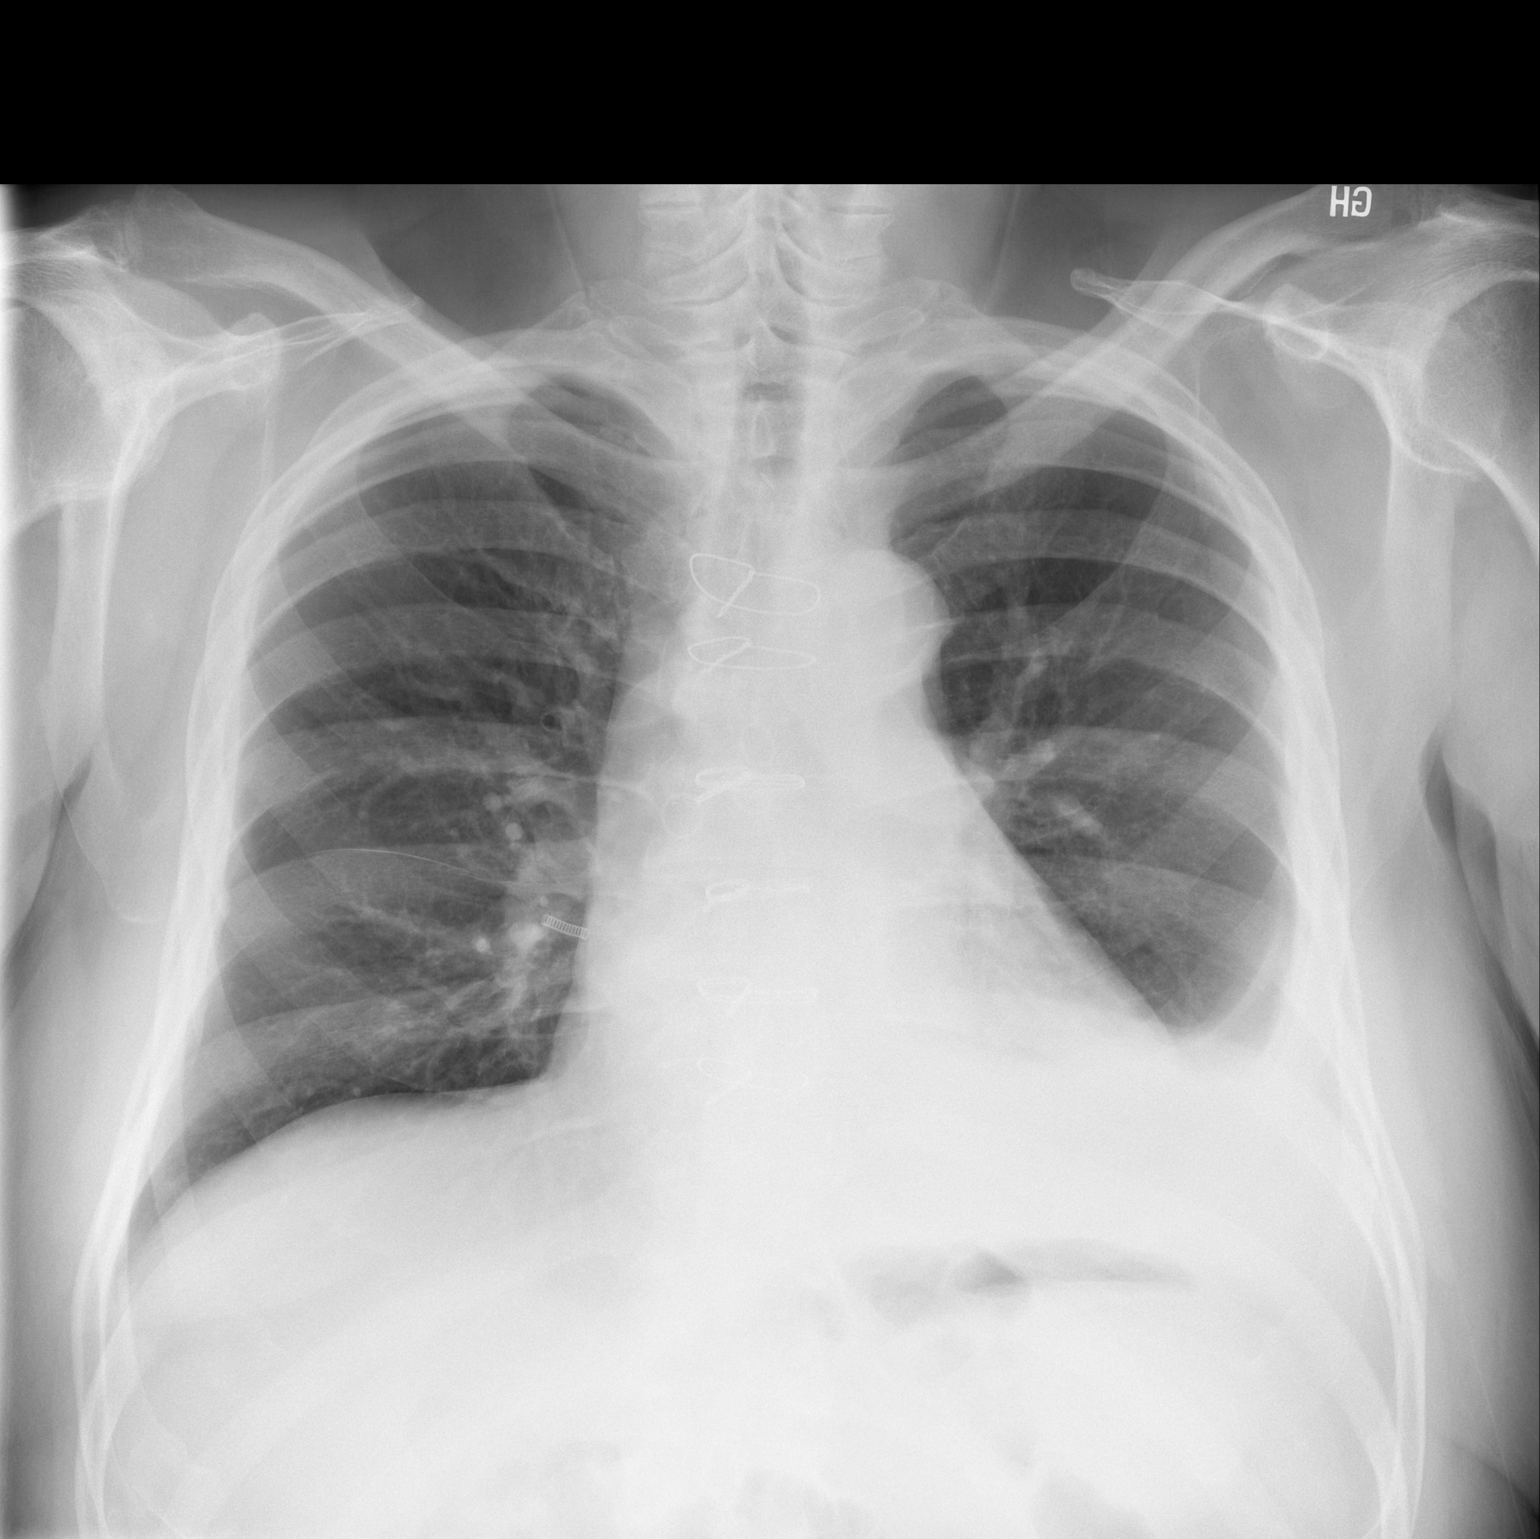

[w chest lat]
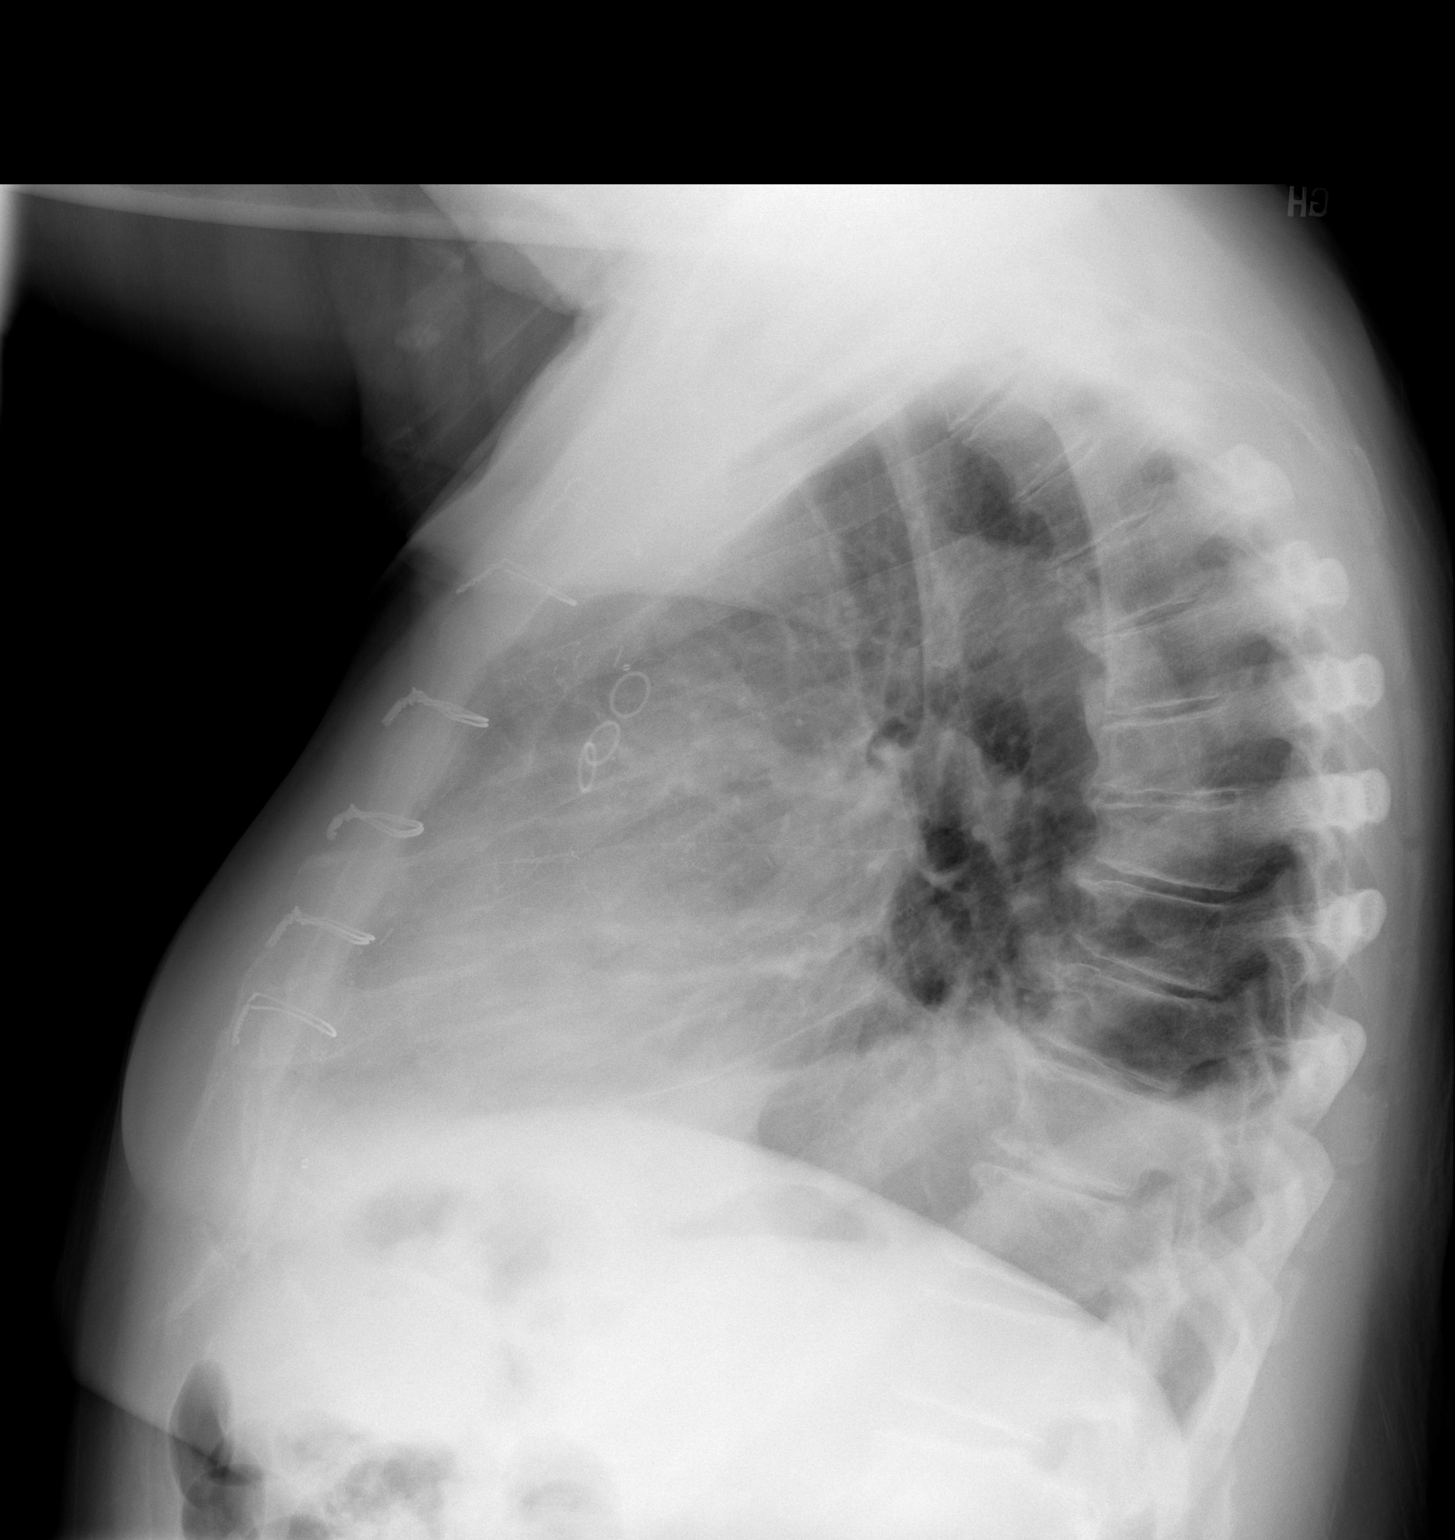

[2 of 2 positions shown; findings below may reference images not displayed]

FINDINGS: Cardiomediastinal contours and hilar structures are stable with
changes of median sternotomy and CABG.

LEFT-sided pleural effusion, moderate size with similar appearance.
No lobar consolidative process. No RIGHT-sided effusion.

On limited assessment there is no acute skeletal process.

[place] projects just to the RIGHT of the heart border on the AP
projection not seen on previous imaging and not seen on the lateral
view likely external to the patient.
IMPRESSION: Moderate LEFT-sided pleural effusion is unchanged. Stable appearance
of the chest since August 23, 2021.

## 2023-07-01 DIAGNOSIS — D649 Anemia, unspecified: Secondary | ICD-10-CM | POA: Diagnosis not present

## 2023-07-01 DIAGNOSIS — D6859 Other primary thrombophilia: Secondary | ICD-10-CM | POA: Diagnosis not present

## 2023-07-01 DIAGNOSIS — I25118 Atherosclerotic heart disease of native coronary artery with other forms of angina pectoris: Secondary | ICD-10-CM | POA: Diagnosis not present

## 2023-07-01 DIAGNOSIS — M109 Gout, unspecified: Secondary | ICD-10-CM | POA: Diagnosis not present

## 2023-07-01 DIAGNOSIS — E1169 Type 2 diabetes mellitus with other specified complication: Secondary | ICD-10-CM | POA: Diagnosis not present

## 2023-07-01 DIAGNOSIS — I700 Atherosclerosis of aorta: Secondary | ICD-10-CM | POA: Diagnosis not present

## 2023-07-01 DIAGNOSIS — Z951 Presence of aortocoronary bypass graft: Secondary | ICD-10-CM | POA: Diagnosis not present

## 2023-07-01 DIAGNOSIS — G319 Degenerative disease of nervous system, unspecified: Secondary | ICD-10-CM | POA: Diagnosis not present

## 2023-07-01 DIAGNOSIS — I10 Essential (primary) hypertension: Secondary | ICD-10-CM | POA: Diagnosis not present

## 2023-07-01 DIAGNOSIS — I4891 Unspecified atrial fibrillation: Secondary | ICD-10-CM | POA: Diagnosis not present

## 2023-07-01 DIAGNOSIS — R7989 Other specified abnormal findings of blood chemistry: Secondary | ICD-10-CM | POA: Diagnosis not present

## 2023-08-03 ENCOUNTER — Other Ambulatory Visit: Payer: Self-pay | Admitting: Internal Medicine

## 2023-09-25 ENCOUNTER — Other Ambulatory Visit: Payer: Self-pay | Admitting: Internal Medicine

## 2023-09-25 DIAGNOSIS — I480 Paroxysmal atrial fibrillation: Secondary | ICD-10-CM

## 2023-09-25 NOTE — Telephone Encounter (Signed)
Prescription refill request for Eliquis received. Indication: afib  Last office visit: [facility], 11/20/2022 Scr: 1.9, 12/12/2022, per PCP office Age: 77 yo  Weight: 108.1 kg   Refill sent.

## 2023-11-03 ENCOUNTER — Other Ambulatory Visit: Payer: Self-pay | Admitting: Internal Medicine

## 2023-11-27 ENCOUNTER — Encounter: Payer: Self-pay | Admitting: Internal Medicine

## 2023-11-27 ENCOUNTER — Ambulatory Visit: Payer: Medicare PPO | Attending: Internal Medicine | Admitting: Internal Medicine

## 2023-11-27 VITALS — BP 106/74 | HR 72 | Ht 72.0 in | Wt 247.6 lb

## 2023-11-27 DIAGNOSIS — I480 Paroxysmal atrial fibrillation: Secondary | ICD-10-CM

## 2023-11-27 DIAGNOSIS — I2510 Atherosclerotic heart disease of native coronary artery without angina pectoris: Secondary | ICD-10-CM | POA: Diagnosis not present

## 2023-11-27 DIAGNOSIS — E782 Mixed hyperlipidemia: Secondary | ICD-10-CM

## 2023-11-27 DIAGNOSIS — I10 Essential (primary) hypertension: Secondary | ICD-10-CM

## 2023-11-27 NOTE — Progress Notes (Signed)
OFFICE NOTE  Chief Complaint:  Follow-up CABG, afib  Primary Care Physician: Oscar Ferretti, DO  HPI:  Oscar [name] is a 78 y.o. male who is a history of moderate obesity, dyslipidemia, hypertension and type 2 diabetes. There is a strong family history of heart disease with his father who died of a massive heart attack at age 63 and a brother who died of an MI at age 34. Recently he's noted some increase in palpitations. He saw his primary care provider who did EKGs and noted he was having PACs. He was started on metoprolol tartrate 25 mg twice a day. He reports marked improvement in his symptoms over his EKG still shows PACs. Heart rate however is lower and blood pressure which was in the 140s and 150 systolic is now 122/76 in the office today. Oscar [name] denies any chest pain but notes that he does get short of breath and sweats very easily with minimal exertion. Although his overweight his weight is been elevated for some time and not necessarily increasing to explain the new change in his symptoms. He's felt more fatigued recently as well. Of note he previously seen  cardiology in the early 2000's. He underwent heart catheterization 2002, which showed normal LV systolic function and three-vessel nonobstructive coronary disease. He was noted to have a 30% ostial LAD stenosis, a 50% first diagonal branch stenosis, and apical 70% LAD stenosis. The left circumflex had a 40% stenosis in the mid vessel and diffuse 20% distal stenosis. Right coronary artery was codominant and noted to have 50% diffuse stenosis in the mid vessel and a tubular 60% distal stenosis. Medical therapy was recommended. EKG today shows sinus rhythm with PACs and left anterior fascicular block. There was voltage criteria for LVH and lateral T-wave inversions concerning for ischemia.  07/24/2016  Oscar [name] returns today for follow-up of his studies. He underwent nuclear stress testing and ultimately had an echocardiogram due  to abnormal nuclear findings. Nuclear stress test showed no reversible ischemia and a fixed basal inferior defect concerning for probable artifact. LVEF was decreased at 42% however the nuclear readers suggested that the EF appeared to be normal. Subsequent echocardiography showed normal LV function without regional wall motion abnormalities and mild diastolic dysfunction. I shared those results of Oscar [name] today and feel that they're fairly reassuring. He's not describing chest pain but does get short of breath and sweats easily with minimal exertion. This could be weight related. He does note improvement with metoprolol with regards to his PACs. He says he no longer has any palpitations. He is on optimal medical therapy for diabetes, cholesterol and is on daily aspirin.  01/21/2017  Oscar [name] was seen today in follow-up. He denies any worsening shortness of breath or chest pain. His EKG is unchanged showing persistent T-wave inversions. Echo which showed mild diastolic dysfunction with normal LVEF. I've encouraged him to work on exercise and weight loss however there is not been a lot of that. He recently played golf the other weekend said that he was able to walk around without any difficulty but then had to walk several blocks to go to dinner and became a little short of breath. He reports his blood sugars been well controlled with hemoglobin A1c is 6. Cholesterol is at goal as well. He takes only half tablet of Lasix for some intermittent swelling of his legs mostly related to venous hypertension.  03/02/2018  Oscar [name] returns today for follow-up.  Overall he is done  well.  He denies any worsening chest pain or shortness of breath.  He has been playing some golf.  Recently has been having problems with his back.  He is followed by Dr. Newell Coral.  He has had some leg pain which is unclear if it is related to his back or peripheral arterial disease.  He reports cool feet and has answered yes to some notable  lower extremity hair loss.  He has a history of hypertension and dyslipidemia as described.  His EKG actually looks somewhat better today, but does show sinus rhythm with PACs, LVH and nonspecific T wave changes laterally.  Lab work is pending from his PCP, however he said his hemoglobin A1c was 6.3 on metformin.  05/30/2020  Oscar [name] seen today in follow-up.  Is been 2 years since I last saw him.  He has followed up with his PCP back in June.  The time his A1c was 7.9.  Cholesterol was assessed in January.  Total was 172, HDL 37, triglycerides 115 and LDL 112.  His target LDL should probably be less than 70 given diabetes and known CAD by cath in 2002.  He is on pravastatin.  We discussed the possibility of adding ezetimibe or more aggressive dietary invention and weight loss.  It does appear that he is down a few pounds since his visit in June.  He has some difficulty exercising due to back issues.  He recently was started on vitamin B12 with some improvement in what is thought to be peripheral neuropathy as he is described "cool feet" but had essentially normal lower extremity arterial Dopplers in 2019.  08/21/2020  Oscar [name] seen today for follow-up.  He has reported some progressive shortness of breath and fatigue. He occasionally gets tingling in his left arm and left anterior chest wall, sometimes worse with exertion.  Blood pressure was elevated today however at 161/84.  He reports home blood pressure readings are better.  EKG today shows a sinus rhythm with some PACs.  He denies any palpitations or arrhythmias.  He had cholesterol testing in January 2021 showed total cholesterol 172, HDL 37, LDL 112 and triglycerides 540.  Hemoglobin A1c as of June was 7.9.  He is working with his PCP on his blood sugars.  LDL still remains above target.  He has ongoing complaints of cool feet which feel warm but are subjectively cool and has had diagnosis of neuropathy.  He also had lower extremity arterial Dopplers  in the past which were normal.  10/09/2020  Oscar [name] returns today for follow-up.  He underwent stress testing on November 16.  There was a small inferior attenuation artifact which improved with upright imaging.  This is more consistent with bowel shadowing.  No ischemia was noted.  EF was at 49% however an echo in the past that showed a low normal EF around 50%.  He really denies any shortness of breath.  He had been complaining of left arm symptoms really concerning for a radiculopathy.  He saw Dr.Kuzma who did nerve conduction testing and from what he explained to me it sounds like he may have left ulnar nerve entrapment at the elbow and may need surgery.  From a cardiac standpoint he is at acceptable risk for this.  06/22/2021  Oscar [name] is seen today as an urgent follow-up.  He called in the office with concerns about worsening shortness of breath and chest pressure.  This is developed over the past week or so.  He  did have COVID-19 I think in July.  He said he did recover fully from that but the symptoms developed afterwards.  He has had chest discomfort symptoms before.  He had some known mild coronary disease by catheter remotely in 2002 but did have a family history of early onset heart disease and has numerous cardiovascular risk factors including obesity, diabetes, dyslipidemia and hypertension.  He denies any particular chest pain.  EKG today actually shows a normal sinus rhythm with left anterior fascicular block at 67.  Blood pressure is excellent 118/72.  Recent lab work included a lipid profile showing total cholesterol 173, HDL 41, LDL 113 and triglycerides 97.  A1c of 6.2, liver enzymes were normal.  07/05/2021  Oscar [name] returns for follow-up.  He was seen just a few weeks ago for progressive dyspnea on exertion and chest pressure.  This had presented and worsened over the past week.  He thought it was related initially to COVID-19 but he did recover somewhat from the symptoms.  I referred  him for coronary CT angiogram which was performed on 06/28/2021.  Image quality was felt to be suboptimal however the calcium score was significantly elevated at 2381.  97th percentile for age and sex matched controls.  Coronary arteries were thought to be diffusely diseased with severe stenosis in the right coronary artery, LAD and circumflex arteries-CAD RADS 4B.  Based on these findings, cardiac catheterization was recommended.  I reviewed the images and I agree with this recommendation.  I discussed this with the patient and his wife today as well as his daughter via teleconference.  We did discuss the procedure which will likely be radial catheterization at length including the risk, benefits and alternatives today and he is agreeable to proceed.  08/17/2021  Oscar [name] returns today for follow-up.  He underwent fairly urgent cardiac catheterization for symptoms concerning for unstable angina.  He had a coronary CT angiogram which showed significant calcium over 2000 and probable severe stenosis in the right coronary artery LAD and circumflex arteries.  Cath did demonstrate severe three-vessel coronary disease.  He was referred for coronary artery bypass grafting.  He underwent CABG x5 on 07/16/2021 with LIMA to LAD, SVG to diagonal, SVG to PDA and sequential SVG to OM 2 and OM 3.  Overall he tolerated this well however developed atrial fibrillation perioperatively.  He was treated with amiodarone and developed atrial flutter for which he had rapid atrial pacing but did not convert him.  He then went in and out of atrial fibrillation but ultimately converted back to a sinus rhythm.  He was recently seen in follow-up and his amiodarone was decreased further to 200 mg daily.  He also was maintained on digoxin.  Weight has been pretty stable at around 247 pounds at home.  He reports some fatigue but is generally improving.  11/20/2022  Oscar [name] is seen today in follow-up.  He seems to be doing well without any  recurrent A-fib.  He has continued concerns about discomfort at his sternotomy scar site including tenderness and intermittent shooting pains.  He denies any chest pain.  Blood pressure was low normal today but he has no dizziness or orthostatic symptoms.  11/27/2023  Oscar [name] returns today for follow-up.  He has done well over the past year.  Again his has come concerns about his sternotomy scar which is not totally healed.  He gets tenderness and pain primarily related to the keloid.  We again discussed possible management options  including steroid injection and Silastic gels however he said he was not too interested in that.  He denies any anginal symptoms.  His blood pressure is well-controlled today.  He had lipids about a year ago which were well-controlled with an LDL of 63 and he is due for repeat labs next month.  He will follow-up with his primary care provider.  Overall he seems to be doing well.  He is concerned a little about some weight gain.  He says that he has been less active and attributes most of it to that.  PMHx:  Past Medical History:  Diagnosis Date   Anesthesia complication    PER PT, "HARD TO WAKE' UP PAST SHOULDER SURGERY IN 2016   Arthritis    lumbar, spondylosis, DDD,stenosis    Atrial premature depolarization    Chronic right hip pain    and right leg pain   Coronary artery disease    Diabetes mellitus without complication (HCC)    Dysrhythmia    pt. reports that he has been told in the past that he has  irreg. heattbeat    Edema    Gout    bil ankles   Hyperlipidemia    Hypertension    Rotator cuff tear    right   Shortness of breath    Swelling of extremity    bil feet   Type 2 diabetes mellitus with hyperglycemia [facility])     Past Surgical History:  Procedure Laterality Date   BACK SURGERY     January 2014   CARDIAC CATHETERIZATION     2004, outcome good, to be followed for/with medical therapy    CORONARY ARTERY BYPASS GRAFT N/A 07/16/2021    Procedure: CORONARY ARTERY BYPASS GRAFTING (CABG) X FIVE, USING LEFT INTERNAL MAMMARY ARTERY AND RIGHT LEG GREATER SAPHENOUS VEIN HARVESTED ENDOSCOPICALLY;  Surgeon: Loreli Slot, MD;  Location: [facility] OR;  Service: Open Heart Surgery;  Laterality: N/A;   ELBOW BURSA SURGERY     removed bursacs both elbows   ENDOVEIN HARVEST OF GREATER SAPHENOUS VEIN  07/16/2021   Procedure: ENDOVEIN HARVEST OF GREATER SAPHENOUS VEIN;  Surgeon: Loreli Slot, MD;  Location: [facility] OR;  Service: Open Heart Surgery;;   FASCIECTOMY Left 12/12/2020   Procedure: FASCIECTOMY LEFT SMALL FINGER;  Surgeon: Cindee Salt, MD;  Location: [facility];  Service: Orthopedics;  Laterality: Left;   LEFT HEART CATH AND CORONARY ANGIOGRAPHY N/A 07/12/2021   Procedure: LEFT HEART CATH AND CORONARY ANGIOGRAPHY;  Surgeon: Kathleene Hazel, MD;  Location: [facility] INVASIVE CV LAB;  Service: Cardiovascular;  Laterality: N/A;   SHOULDER ARTHROSCOPY WITH ROTATOR CUFF REPAIR AND SUBACROMIAL DECOMPRESSION Right 08/21/2015   Procedure: SHOULDER ARTHROSCOPY WITH ROTATOR CUFF REPAIR S UBACROMIAL DECOMPRESSION, ;  Surgeon: Jones Broom, MD;  Location: [facility];  Service: Orthopedics;  Laterality: Right;  Right shoulder arthroscopy rotator cuff repair vs debridement, subacromial decompression, possible biceps tenotomy.   TEE WITHOUT CARDIOVERSION N/A 07/16/2021   Procedure: TRANSESOPHAGEAL ECHOCARDIOGRAM (TEE);  Surgeon: Loreli Slot, MD;  Location: [facility] OR;  Service: Open Heart Surgery;  Laterality: N/A;    FAMHx:  Family History  Problem Relation Age of Onset   Heart disease Father    Diabetes Father    Heart disease Brother    Diabetes Paternal Grandmother    Colon cancer Neg Hx    Esophageal cancer Neg Hx    Rectal cancer Neg Hx    Stomach cancer Neg Hx  SOCHx:   reports that he quit smoking about 50 years ago. His smoking use included cigarettes. He started smoking about 60 years ago.  He has a 3 pack-year smoking history. He has never used smokeless tobacco. He reports that he does not currently use alcohol. He reports that he does not use drugs.  ALLERGIES:  Allergies  Allergen Reactions   Yellow Fever Vaccine Hives    ROS: Pertinent items noted in HPI and remainder of comprehensive ROS otherwise negative.  HOME MEDS: Current Outpatient Medications  Medication Sig Dispense Refill   atorvastatin (LIPITOR) 80 MG tablet TAKE ONE TABLET BY MOUTH ONE TIME DAILY 90 tablet 3   ELIQUIS 5 MG TABS tablet TAKE ONE TABLET BY MOUTH TWICE DAILY 180 tablet 0   furosemide (LASIX) 20 MG tablet TAKE ONE TABLET BY MOUTH ONE TIME DAILY 90 tablet 3   halobetasol (ULTRAVATE) 0.05 % cream Apply 1 application topically 2 (two) times daily as needed (psoriasis).     hydrocortisone 2.5 % cream Apply topically 2 (two) times daily. (Patient taking differently: Apply 1 application  topically 2 (two) times daily as needed (psoriasis).) 30 g 4   losartan (COZAAR) 50 MG tablet Take by mouth.     Magnesium 250 MG TABS Take 250 mg by mouth in the morning.     metFORMIN (GLUCOPHAGE) 500 MG tablet Take 500-1,000 mg by mouth See admin instructions. Take 1 tablet (500 mg) by mouth in the morning & take 2 tablets (1000 mg) by mouth in the afternoon.     metoprolol succinate (TOPROL-XL) 25 MG 24 hr tablet take one-half tablet by mouth at bedtime 30 tablet 11   omeprazole (PRILOSEC OTC) 20 MG tablet Take 20 mg by mouth daily as needed (indigestion/heartburn).     potassium chloride SA (KLOR-CON M) 20 MEQ tablet TAKE ONE TABLET BY MOUTH ONCE A DAY 90 tablet 0   vitamin B-12 (CYANOCOBALAMIN) 500 MCG tablet Take 500 mcg by mouth in the morning.     No current facility-administered medications for this visit.   Facility-Administered Medications Ordered in Other Visits  Medication Dose Route Frequency Provider Last Rate Last Admin   technetium tetrofosmin (TC-MYOVIEW) injection 31.6 millicurie  31.6 millicurie  Intravenous Once PRN Lewayne Bunting, MD        LABS/IMAGING: No results found for this or any previous visit (from the past 48 hours). No results found.  WEIGHTS: Wt Readings from Last 3 Encounters:  11/27/23 247 lb 9.6 oz (112.3 kg)  11/20/22 238 lb 6.4 oz (108.1 kg)  07/16/22 233 lb 6.4 oz (105.9 kg)    VITALS: BP 106/74 (BP Location: Left Arm, Patient Position: Sitting)   Pulse 72   Ht 6' (1.829 m)   Wt 247 lb 9.6 oz (112.3 kg)   SpO2 (!) 83%   BMI 33.58 kg/m   EXAM: General appearance: alert and no distress Neck: no carotid bruit, no JVD, and thyroid not enlarged, symmetric, no tenderness/mass/nodules Lungs: clear to auscultation bilaterally Heart: regular rate and rhythm, S1, S2 normal, no murmur, click, rub or gallop and well-healed midline scar Abdomen: soft, non-tender; bowel sounds normal; no masses,  no organomegaly Extremities: extremities normal, atraumatic, no cyanosis or edema Pulses: 2+ and symmetric Skin: Skin color, texture, turgor normal. No rashes or lesions Neurologic: Grossly normal Psych: Pleasant  EKG: EKG Interpretation Date/Time:  Thursday November 27 2023 08:29:51 EST Ventricular Rate:  72 PR Interval:  200 QRS Duration:  110 QT Interval:  416 QTC Calculation:  455 R Axis:   -71  Text Interpretation: Normal sinus rhythm Left anterior fascicular block Minimal voltage criteria for LVH, may be normal variant ( Cornell product ) When compared with ECG of 01-Aug-2021 10:12, Premature atrial complexes are no longer Present ST no longer elevated in Inferior leads ST no longer depressed in Lateral leads Confirmed by Zoila Shutter [phone]) on 11/27/2023 8:36:24 AM    ASSESSMENT: CAD status post CABG x5 with LIMA to LAD, SVG to diagonal, SVG to PDA and sequential SVG to OM 2 and OM 3 (07/16/2021) Abnormal coronary CT angiogram with calcium score 2381, 97th percentile and three-vessel coronary disease (06/28/2021). Chest pressure, dyspnea on exertion  -low risk Myoview stress test (09/2020) Low risk Myoview stress test and normal LVEF by echo 60-65% (2017) PVCs and palpitations Abnormal EKG with T-wave inversions History of moderate nonobstructive coronary artery disease by cath in 2002 Strong family history of premature coronary disease in his father and brother who died of MIs in their 66s Type 2 diabetes Dyslipidemia Hypertension Perioperative atrial fibrillation.  PLAN: 1.   Mr. Kable seems to be doing well without anginal symptoms.  He is about 2-1/2 years now out of surgery.  We discussed management options for his keloid scar however although he continues to discuss it he is not interested at this time in any management for it.  Blood pressure and cholesterol appear to be well-controlled.  He did have some perioperative A-fib for which she is anticoagulated but has not had any recurrence that we can tell.  No changes to his medicines today.  Follow-up with me annually or sooner as necessary.  Chrystie Nose, MD, [facility], FACP  [place]  [facility]   Medical Director of the [facility] Lipid Disorders &  Cardiovascular Risk Reduction Clinic Diplomate of the American Board of Clinical Lipidology Attending Cardiologist  Direct Dial: [phone]  Fax: [phone]  Website:  [email or web address]  Chrystie Nose 11/27/2023, 8:36 AM

## 2023-11-27 NOTE — Patient Instructions (Signed)
Medication Instructions:  Your physician recommends that you continue on your current medications as directed. Please refer to the Current Medication list given to you today.    *If you need a refill on your cardiac medications before your next appointment, please call your pharmacy*   Lab Work: None    If you have labs (blood work) drawn today and your tests are completely normal, you will receive your results only by: MyChart Message (if you have MyChart) OR A paper copy in the mail If you have any lab test that is abnormal or we need to change your treatment, we will call you to review the results.   Testing/Procedures: None    Follow-Up: At [facility], you and your health needs are our priority.  As part of our continuing mission to provide you with exceptional heart care, we have created designated Provider Care Teams.  These Care Teams include your primary Cardiologist (physician) and Advanced Practice Providers (APPs -  Physician Assistants and Nurse Practitioners) who all work together to provide you with the care you need, when you need it.  We recommend signing up for the patient portal called "MyChart".  Sign up information is provided on this After Visit Summary.  MyChart is used to connect with patients for Virtual Visits (Telemedicine).  Patients are able to view lab/test results, encounter notes, upcoming appointments, etc.  Non-urgent messages can be sent to your provider as well.   To learn more about what you can do with MyChart, go to [email or web address].    Your next appointment:   1 year(s)  The format for your next appointment:   In Person  Provider:   Chrystie Nose, MD    Other Instructions

## 2023-12-10 ENCOUNTER — Other Ambulatory Visit: Payer: Self-pay | Admitting: Internal Medicine

## 2023-12-15 DIAGNOSIS — E538 Deficiency of other specified B group vitamins: Secondary | ICD-10-CM | POA: Diagnosis not present

## 2023-12-15 DIAGNOSIS — E1169 Type 2 diabetes mellitus with other specified complication: Secondary | ICD-10-CM | POA: Diagnosis not present

## 2023-12-15 DIAGNOSIS — N1832 Chronic kidney disease, stage 3b: Secondary | ICD-10-CM | POA: Diagnosis not present

## 2023-12-15 DIAGNOSIS — I2510 Atherosclerotic heart disease of native coronary artery without angina pectoris: Secondary | ICD-10-CM | POA: Diagnosis not present

## 2023-12-15 DIAGNOSIS — I129 Hypertensive chronic kidney disease with stage 1 through stage 4 chronic kidney disease, or unspecified chronic kidney disease: Secondary | ICD-10-CM | POA: Diagnosis not present

## 2023-12-15 DIAGNOSIS — M109 Gout, unspecified: Secondary | ICD-10-CM | POA: Diagnosis not present

## 2023-12-15 DIAGNOSIS — E854 Organ-limited amyloidosis: Secondary | ICD-10-CM | POA: Diagnosis not present

## 2023-12-15 DIAGNOSIS — E785 Hyperlipidemia, unspecified: Secondary | ICD-10-CM | POA: Diagnosis not present

## 2023-12-15 DIAGNOSIS — D649 Anemia, unspecified: Secondary | ICD-10-CM | POA: Diagnosis not present

## 2023-12-15 DIAGNOSIS — Z125 Encounter for screening for malignant neoplasm of prostate: Secondary | ICD-10-CM | POA: Diagnosis not present

## 2023-12-15 LAB — LAB REPORT - SCANNED
A1c: 5.6
Calcium: 8.9
EGFR: 39.3
TSH: 2.67 (ref 0.41–5.90)

## 2023-12-23 ENCOUNTER — Other Ambulatory Visit: Payer: Self-pay | Admitting: Internal Medicine

## 2023-12-23 DIAGNOSIS — M109 Gout, unspecified: Secondary | ICD-10-CM | POA: Diagnosis not present

## 2023-12-23 DIAGNOSIS — R82998 Other abnormal findings in urine: Secondary | ICD-10-CM | POA: Diagnosis not present

## 2023-12-23 DIAGNOSIS — I10 Essential (primary) hypertension: Secondary | ICD-10-CM | POA: Diagnosis not present

## 2023-12-23 DIAGNOSIS — E1169 Type 2 diabetes mellitus with other specified complication: Secondary | ICD-10-CM | POA: Diagnosis not present

## 2023-12-23 DIAGNOSIS — D649 Anemia, unspecified: Secondary | ICD-10-CM | POA: Diagnosis not present

## 2023-12-23 DIAGNOSIS — E785 Hyperlipidemia, unspecified: Secondary | ICD-10-CM | POA: Diagnosis not present

## 2023-12-23 DIAGNOSIS — I4891 Unspecified atrial fibrillation: Secondary | ICD-10-CM | POA: Diagnosis not present

## 2023-12-23 DIAGNOSIS — N1832 Chronic kidney disease, stage 3b: Secondary | ICD-10-CM | POA: Diagnosis not present

## 2023-12-23 DIAGNOSIS — I480 Paroxysmal atrial fibrillation: Secondary | ICD-10-CM

## 2023-12-23 DIAGNOSIS — E876 Hypokalemia: Secondary | ICD-10-CM | POA: Diagnosis not present

## 2023-12-23 DIAGNOSIS — I25118 Atherosclerotic heart disease of native coronary artery with other forms of angina pectoris: Secondary | ICD-10-CM | POA: Diagnosis not present

## 2023-12-24 NOTE — Telephone Encounter (Signed)
 Prescription refill request for Eliquis received. Indication:afib Last office visit:1/25 Scr:2.0  2/25 Age: 78 Weight:112.3  kg  Prescription refilled

## 2024-03-01 DIAGNOSIS — H43813 Vitreous degeneration, bilateral: Secondary | ICD-10-CM | POA: Diagnosis not present

## 2024-03-01 DIAGNOSIS — H25813 Combined forms of age-related cataract, bilateral: Secondary | ICD-10-CM | POA: Diagnosis not present

## 2024-03-01 DIAGNOSIS — E119 Type 2 diabetes mellitus without complications: Secondary | ICD-10-CM | POA: Diagnosis not present

## 2024-06-29 DIAGNOSIS — R600 Localized edema: Secondary | ICD-10-CM | POA: Diagnosis not present

## 2024-06-29 DIAGNOSIS — E611 Iron deficiency: Secondary | ICD-10-CM | POA: Diagnosis not present

## 2024-06-29 DIAGNOSIS — E1169 Type 2 diabetes mellitus with other specified complication: Secondary | ICD-10-CM | POA: Diagnosis not present

## 2024-06-29 DIAGNOSIS — M545 Low back pain, unspecified: Secondary | ICD-10-CM | POA: Diagnosis not present

## 2024-06-29 DIAGNOSIS — E669 Obesity, unspecified: Secondary | ICD-10-CM | POA: Diagnosis not present

## 2024-06-29 DIAGNOSIS — I25118 Atherosclerotic heart disease of native coronary artery with other forms of angina pectoris: Secondary | ICD-10-CM | POA: Diagnosis not present

## 2024-06-29 DIAGNOSIS — N1832 Chronic kidney disease, stage 3b: Secondary | ICD-10-CM | POA: Diagnosis not present

## 2024-06-29 DIAGNOSIS — I4891 Unspecified atrial fibrillation: Secondary | ICD-10-CM | POA: Diagnosis not present

## 2024-06-29 DIAGNOSIS — I10 Essential (primary) hypertension: Secondary | ICD-10-CM | POA: Diagnosis not present

## 2024-07-02 DIAGNOSIS — K921 Melena: Secondary | ICD-10-CM | POA: Diagnosis not present

## 2024-07-17 ENCOUNTER — Other Ambulatory Visit: Payer: Self-pay | Admitting: Internal Medicine

## 2024-07-17 DIAGNOSIS — I480 Paroxysmal atrial fibrillation: Secondary | ICD-10-CM

## 2024-07-19 NOTE — Telephone Encounter (Signed)
 Prescription refill request for Eliquis  received. Indication: AF Last office visit: 1/25 Scr: 2 Age:  78 Weight: 112.3

## 2024-07-21 ENCOUNTER — Encounter: Payer: Self-pay | Admitting: Gastroenterology

## 2024-08-05 DIAGNOSIS — M48062 Spinal stenosis, lumbar region with neurogenic claudication: Secondary | ICD-10-CM | POA: Diagnosis not present

## 2024-08-05 DIAGNOSIS — M4316 Spondylolisthesis, lumbar region: Secondary | ICD-10-CM | POA: Diagnosis not present

## 2024-08-16 ENCOUNTER — Ambulatory Visit: Attending: Neurosurgery | Admitting: Physical Therapy

## 2024-08-16 ENCOUNTER — Other Ambulatory Visit: Payer: Self-pay

## 2024-08-16 ENCOUNTER — Encounter: Payer: Self-pay | Admitting: Physical Therapy

## 2024-08-16 DIAGNOSIS — M5459 Other low back pain: Secondary | ICD-10-CM | POA: Diagnosis not present

## 2024-08-16 DIAGNOSIS — M6281 Muscle weakness (generalized): Secondary | ICD-10-CM | POA: Diagnosis not present

## 2024-08-16 DIAGNOSIS — M7099 Unspecified soft tissue disorder related to use, overuse and pressure multiple sites: Secondary | ICD-10-CM | POA: Diagnosis not present

## 2024-08-16 NOTE — Therapy (Addendum)
 OUTPATIENT PHYSICAL THERAPY THORACOLUMBAR EVALUATION   Patient Name: Oscar [name] MRN: [id number] DOB:05/26/1946, 78 y.o., male Today's Date: 08/16/2024  END OF SESSION:  PT End of Session - 08/16/24 0828     Visit Number 1    Number of Visits 7    Date for Recertification  09/27/24    PT Start Time 0830    PT Stop Time 0910    PT Time Calculation (min) 40 min    Activity Tolerance Patient tolerated treatment well    Behavior During Therapy WFL for tasks assessed/performed          Past Medical History:  Diagnosis Date   Anesthesia complication    PER PT, HARD TO WAKE' UP PAST SHOULDER SURGERY IN 2016   Arthritis    lumbar, spondylosis, DDD,stenosis    Atrial premature depolarization    Chronic right hip pain    and right leg pain   Coronary artery disease    Diabetes mellitus without complication (HCC)    Dysrhythmia    pt. reports that he has been told in the past that he has  irreg. heattbeat    Edema    Gout    bil ankles   Hyperlipidemia    Hypertension    Rotator cuff tear    right   Shortness of breath    Swelling of extremity    bil feet   Type 2 diabetes mellitus with hyperglycemia (HCC)    Past Surgical History:  Procedure Laterality Date   BACK SURGERY     January 2014   CARDIAC CATHETERIZATION     2004, outcome good, to be followed for/with medical therapy    CORONARY ARTERY BYPASS GRAFT N/A 07/16/2021   Procedure: CORONARY ARTERY BYPASS GRAFTING (CABG) X FIVE, USING LEFT INTERNAL MAMMARY ARTERY AND RIGHT LEG GREATER SAPHENOUS VEIN HARVESTED ENDOSCOPICALLY;  Surgeon: Kerrin Elspeth BROCKS, MD;  Location: [facility] OR;  Service: Open Heart Surgery;  Laterality: N/A;   ELBOW BURSA SURGERY     removed bursacs both elbows   ENDOVEIN HARVEST OF GREATER SAPHENOUS VEIN  07/16/2021   Procedure: ENDOVEIN HARVEST OF GREATER SAPHENOUS VEIN;  Surgeon: Kerrin Elspeth BROCKS, MD;  Location: [facility] OR;  Service: Open Heart Surgery;;   FASCIECTOMY Left 12/12/2020    Procedure: FASCIECTOMY LEFT SMALL FINGER;  Surgeon: Murrell Kuba, MD;  Location: [facility];  Service: Orthopedics;  Laterality: Left;   LEFT HEART CATH AND CORONARY ANGIOGRAPHY N/A 07/12/2021   Procedure: LEFT HEART CATH AND CORONARY ANGIOGRAPHY;  Surgeon: Verlin Lonni BIRCH, MD;  Location: [facility];  Service: Cardiovascular;  Laterality: N/A;   SHOULDER ARTHROSCOPY WITH ROTATOR CUFF REPAIR AND SUBACROMIAL DECOMPRESSION Right 08/21/2015   Procedure: SHOULDER ARTHROSCOPY WITH ROTATOR CUFF REPAIR S UBACROMIAL DECOMPRESSION, ;  Surgeon: Eva Herring, MD;  Location: [facility];  Service: Orthopedics;  Laterality: Right;  Right shoulder arthroscopy rotator cuff repair vs debridement, subacromial decompression, possible biceps tenotomy.   TEE WITHOUT CARDIOVERSION N/A 07/16/2021   Procedure: TRANSESOPHAGEAL ECHOCARDIOGRAM (TEE);  Surgeon: Kerrin Elspeth BROCKS, MD;  Location: [facility] OR;  Service: Open Heart Surgery;  Laterality: N/A;   Patient Active Problem List   Diagnosis Date Noted   S/P CABG x 5    Unstable angina (HCC) 07/12/2021   Coronary artery disease involving native coronary artery of native heart with unstable angina pectoris (HCC)    Essential hypertension 01/02/2021   Dyslipidemia, goal LDL below 70 01/02/2021   Obesity (BMI 30-39.9) 01/02/2021  Palpitations 01/02/2021   Cold extremities 03/02/2018   Pain of lower extremity 03/02/2018   Decreased pedal pulses 03/02/2018   Dyspnea 06/28/2016   Abnormal EKG 06/28/2016   Premature atrial contractions 06/28/2016   Family history of heart disease 06/28/2016   CAD in native artery 06/28/2016    PCP: Valentin Skates, DO  REFERRING PROVIDER: Tressia Duncans, MD  REFERRING DIAG: Spondylolisthesis, lumbar region [M43.16]   Rationale for Evaluation and Treatment: Rehabilitation  THERAPY DIAG:  Other low back pain  Unspecified soft tissue disorder related to use, overuse and pressure multiple  sites  Muscle weakness (generalized)  PERTINENT HISTORY: CAD, unstable angina, s/p, CABG x5, HTN  WEIGHT BEARING RESTRICTIONS: No  FALLS:  Has patient fallen in last 6 months? No  LIVING ENVIRONMENT: Lives with: lives with their family Lives in: House/apartment Stairs: Yes: Internal: 12 steps; can reach both Has following equipment at home: None  OCCUPATION: Driver   PRECAUTIONS: None ---------------------------------------------------------------------------------------------  SUBJECTIVE:                                                                                                                                                                                           SUBJECTIVE STATEMENT: Eval statement 08/16/2024: no MOI, pt started noting back pain R>L. Is worried about the potential of a tumor on the inside of the body, feels like extra weight. Had back surgery on L3/L4 in 2014. Had an xray at [facility], indicated that previous surgery was intact. Pain started 6 months ago sporadically, constant pain, notices the pain most when going to stand up after sitting for a long time.  Pain wakes him up at night, especially when laying on L side, feels like something is shifting.  Pain currently 0/10 gets up to 4/10 No swelling, no abnormal n//t compared to PLOF    RED FLAGS: Bowel or bladder incontinence: No and Spinal tumors: No    PLOF: Independent  PATIENT GOALS: help pain  NEXT MD VISIT: need to schedule ---------------------------------------------------------------------------------------------  OBJECTIVE:  Note: Objective measures were completed at Evaluation unless otherwise noted.  DIAGNOSTIC FINDINGS:  No recent pertinent imaging in pt chart, pt reports recent xrays with [facility]  PATIENT SURVEYS:  MODI:12/50 (24%)  COGNITION: Overall cognitive status: Within functional limits for tasks assessed   PALPATION: Tenderness to R lumbar  paraspinals  Lumbar contraction pattern  L Multifidus:  R Multifidus:    SENSATION: WFL  MUSCLE LENGTH: Hamstrings: Right 20; deg; Left 20 deg   POSTURE: flexed trunk    LUMBAR ROM:   AROM eval  Flexion 80%  Extension 70%  Right lateral flexion 70%  Left  lateral flexion 70%  Right rotation 60%  Left rotation 60%   (Blank rows = not tested)  ! Indicates pain with testing  LOWER EXTREMITY ROM:     Active  Right eval Left eval  Hip flexion    Hip extension    Hip abduction    Hip adduction    Hip internal rotation    Hip external rotation    Knee flexion    Knee extension    Ankle dorsiflexion    Ankle plantarflexion    Ankle inversion    Ankle eversion     (Blank rows = not tested)  ! Indicates pain with testing  LOWER EXTREMITY MMT:    MMT Right eval Left eval  Hip flexion 4 4  Hip extension 4+ 5  Hip abduction 4 4  Hip adduction    Hip internal rotation    Hip external rotation    Knee flexion    Knee extension    Ankle dorsiflexion    Ankle plantarflexion    Ankle inversion    Ankle eversion     (Blank rows = not tested)   ! Indicates pain with testing LUMBAR SPECIAL TESTS:  Prone instability test: Positive GAIT: Distance walked: 100' Assistive device utilized: None Level of assistance: Complete Independence Comments: flexed trunk   [facility] Adult PT Treatment:                                                DATE: 08/16/2024  Self Care: Pt education POC discussion                                                                                                                                PATIENT EDUCATION:  Education details: Pt received education regarding HEP performance, ADL performance, functional activity tolerance, impairment education, appropriate performance of therapeutic activities.  Person educated: Patient Education method: Explanation, Demonstration, Tactile cues, Verbal cues, and Handouts Education comprehension:  verbalized understanding and returned demonstration  HOME EXERCISE PROGRAM: Access Code: [email or web address] URL: [email or web address] Date: 08/16/2024 Prepared by: Mabel Kiang  Exercises - Supine Quadratus Lumborum Stretch  - 1-3 x daily - 7 x weekly - 2 sets - 1 reps - 35m hold - Seated Hamstring Stretch  - 1-3 x daily - 7 x weekly - 2 sets - 1 reps - 61m hold - Supine Bridge  - 1 x daily - 4 x weekly - 2-3 sets - 12 reps - 4s hold - Supine 90/90 Alternating Toe Touch  - 1 x daily - 4 x weekly - 2-3 sets - 10 reps - 2s hold ---------------------------------------------------------------------------------------------  ASSESSMENT:  CLINICAL IMPRESSION: Eval impression (08/16/2024): Pt. attended today's physical therapy session for evaluation of LBP. Pt has complaints of 4/10 LBP that worsens after prolonged posturing. Pt is worried about  systemic growth in the low back d/t a feeling of heaviness and shifting when laying on L side. Pt has no red flags or systemic presentation at eval aside from slight discomfort at night. Pt has notable deficits and would benefit from therapeutic focus on lumbar stability, marked ROM deficits, proximal hip strength, dynamic core activation patterns, and myofascial limitations throughout  thoracolumbar region.  Treatment performed today focused on pt education detailed in the objective. Pt demonstrated good understanding of education provided. required minimal v/t cues and no assistance for appropriate performance with today's activities. Pt requires the intervention of skilled outpatient physical therapy to address the aforementioned deficits and progress towards a functional level in line with therapeutic goals.    OBJECTIVE IMPAIRMENTS: Abnormal gait, decreased activity tolerance, decreased mobility, difficulty walking, decreased ROM, decreased strength, hypomobility, increased fascial restrictions, improper body mechanics, postural dysfunction, and  pain.   ACTIVITY LIMITATIONS: carrying, lifting, sitting, standing, squatting, locomotion level, and caring for others  PARTICIPATION LIMITATIONS: meal prep, cleaning, driving, shopping, community activity, and occupation  PERSONAL FACTORS: Age, Fitness, Past/current experiences, Profession, and Time since onset of injury/illness/exacerbation are also affecting patient's functional outcome.   REHAB POTENTIAL: Good  CLINICAL DECISION MAKING: Stable/uncomplicated  EVALUATION COMPLEXITY: Low   GOALS: Goals reviewed with patient? YES  SHORT TERM GOALS: Target date: 09/06/2024  Pt will be independent with administered HEP to demonstrate the competency necessary for long term managemnet of symptoms at home. Baseline: Goal status: INITIAL   LONG TERM GOALS: Target date: 09/27/2024  Pt. Will achieve a MODI score of 6/50 (12%) as to demonstrate improvement in self-perceived functional ability with daily activities.  Baseline: 12/50 (24%) Goal status: INITIAL  2.  Pt will improve Global hip strength to a 4+/5 to demonstrate improvement in strength for quality of motion and activity performance.  Baseline:  Goal status: INITIAL  3.  Pt will improve Lumbar AROM to 90% of standardized norms with less than 2/10 pain to demonstrate necessary mobility for high quality and safe ADLs  Baseline:  Goal status: INITIAL  4.  Pt will report the ability to stand for >/= 20 minutes as to demonstrate improved tolerance to standing for prolonged time and improved ability to participate in ADLs.  Baseline: 5 Goal status: INITIAL  ---------------------------------------------------------------------------------------------  PLAN:  PT FREQUENCY: 1-2x/week  PT DURATION: 6 weeks  PLANNED INTERVENTIONS: 97110-Therapeutic exercises, 97530- Therapeutic activity, [id number]- Neuromuscular re-education, 97535- Self Care, [id number]- Manual therapy, [id number]- Gait training, [phone]- Aquatic Therapy, Patient/Family  education, Balance training, Stair training, Taping, Joint mobilization, Spinal mobilization, and Moist heat.  PLAN FOR NEXT SESSION: Review HEP, Begin POC as detailed in assessment   Mabel Kiang, PT, DPT 08/16/2024, 9:13 AM    Referring diagnosis? Spondylolisthesis, lumbar region [M43.16]  Treatment diagnosis? (if different than referring diagnosis) Low back pain What was this (referring dx) caused by? []  Surgery []  Fall [x]  Ongoing issue []  Arthritis []  Other: ____________  Laterality: [x]  Rt []  Lt []  Both  Check all possible CPT codes: 7110-Therapeutic exercises, 97530- Therapeutic activity, [id number]- Neuromuscular re-education, 97535- Self Care, [id number]- Manual therapy, [id number]- Gait training, [id number]- Aquatic Therapy, Patient/Family education, Balance training, Stair training, Taping, Joint mobilization, Spinal mobilization, and Moist heat.

## 2024-08-30 ENCOUNTER — Ambulatory Visit

## 2024-08-30 DIAGNOSIS — M7099 Unspecified soft tissue disorder related to use, overuse and pressure multiple sites: Secondary | ICD-10-CM

## 2024-08-30 DIAGNOSIS — M6281 Muscle weakness (generalized): Secondary | ICD-10-CM | POA: Diagnosis not present

## 2024-08-30 DIAGNOSIS — M5459 Other low back pain: Secondary | ICD-10-CM

## 2024-08-30 NOTE — Therapy (Signed)
 OUTPATIENT PHYSICAL THERAPY TREATMENT NOTE   Patient Name: Oscar [name] MRN: [id number] DOB:January 17, 1946, 78 y.o., male Today's Date: 08/30/2024  END OF SESSION:  PT End of Session - 08/30/24 0829     Visit Number 2    Number of Visits 7    Date for Recertification  09/27/24    Authorization Time Period 10 visits approved 08/16/24-11/15/23    Authorization - Visit Number 1    Authorization - Number of Visits 10    PT Start Time 0830    PT Stop Time 0910    PT Time Calculation (min) 40 min    Activity Tolerance Patient tolerated treatment well    Behavior During Therapy WFL for tasks assessed/performed         Past Medical History:  Diagnosis Date   Anesthesia complication    PER PT, HARD TO WAKE' UP PAST SHOULDER SURGERY IN 2016   Arthritis    lumbar, spondylosis, DDD,stenosis    Atrial premature depolarization    Chronic right hip pain    and right leg pain   Coronary artery disease    Diabetes mellitus without complication (HCC)    Dysrhythmia    pt. reports that he has been told in the past that he has  irreg. heattbeat    Edema    Gout    bil ankles   Hyperlipidemia    Hypertension    Rotator cuff tear    right   Shortness of breath    Swelling of extremity    bil feet   Type 2 diabetes mellitus with hyperglycemia (HCC)    Past Surgical History:  Procedure Laterality Date   BACK SURGERY     January 2014   CARDIAC CATHETERIZATION     2004, outcome good, to be followed for/with medical therapy    CORONARY ARTERY BYPASS GRAFT N/A 07/16/2021   Procedure: CORONARY ARTERY BYPASS GRAFTING (CABG) X FIVE, USING LEFT INTERNAL MAMMARY ARTERY AND RIGHT LEG GREATER SAPHENOUS VEIN HARVESTED ENDOSCOPICALLY;  Surgeon: Kerrin Elspeth BROCKS, MD;  Location: [facility] OR;  Service: Open Heart Surgery;  Laterality: N/A;   ELBOW BURSA SURGERY     removed bursacs both elbows   ENDOVEIN HARVEST OF GREATER SAPHENOUS VEIN  07/16/2021   Procedure: ENDOVEIN HARVEST OF GREATER SAPHENOUS  VEIN;  Surgeon: Kerrin Elspeth BROCKS, MD;  Location: [facility] OR;  Service: Open Heart Surgery;;   FASCIECTOMY Left 12/12/2020   Procedure: FASCIECTOMY LEFT SMALL FINGER;  Surgeon: Murrell Kuba, MD;  Location: [facility];  Service: Orthopedics;  Laterality: Left;   LEFT HEART CATH AND CORONARY ANGIOGRAPHY N/A 07/12/2021   Procedure: LEFT HEART CATH AND CORONARY ANGIOGRAPHY;  Surgeon: Verlin Lonni BIRCH, MD;  [facility];  Service: Cardiovascular;  Laterality: N/A;   SHOULDER ARTHROSCOPY WITH ROTATOR CUFF REPAIR AND SUBACROMIAL DECOMPRESSION Right 08/21/2015   Procedure: SHOULDER ARTHROSCOPY WITH ROTATOR CUFF REPAIR S UBACROMIAL DECOMPRESSION, ;  Surgeon: Eva Herring, MD;  Location: [facility];  Service: Orthopedics;  Laterality: Right;  Right shoulder arthroscopy rotator cuff repair vs debridement, subacromial decompression, possible biceps tenotomy.   TEE WITHOUT CARDIOVERSION N/A 07/16/2021   Procedure: TRANSESOPHAGEAL ECHOCARDIOGRAM (TEE);  Surgeon: Kerrin Elspeth BROCKS, MD;  Location: [facility] OR;  Service: Open Heart Surgery;  Laterality: N/A;   Patient Active Problem List   Diagnosis Date Noted   S/P CABG x 5    Unstable angina (HCC) 07/12/2021   Coronary artery disease involving native coronary artery of native  heart with unstable angina pectoris (HCC)    Essential hypertension 01/02/2021   Dyslipidemia, goal LDL below 70 01/02/2021   Obesity (BMI 30-39.9) 01/02/2021   Palpitations 01/02/2021   Cold extremities 03/02/2018   Pain of lower extremity 03/02/2018   Decreased pedal pulses 03/02/2018   Dyspnea 06/28/2016   Abnormal EKG 06/28/2016   Premature atrial contractions 06/28/2016   Family history of heart disease 06/28/2016   CAD in native artery 06/28/2016    PCP: Valentin Skates, DO  REFERRING PROVIDER: Tressia Duncans, MD  REFERRING DIAG: Spondylolisthesis, lumbar region [M43.16]   Rationale for Evaluation and Treatment:  Rehabilitation  THERAPY DIAG:  Other low back pain  Unspecified soft tissue disorder related to use, overuse and pressure multiple sites  Muscle weakness (generalized)  PERTINENT HISTORY: CAD, unstable angina, s/p, CABG x5, HTN  WEIGHT BEARING RESTRICTIONS: No  FALLS:  Has patient fallen in last 6 months? No  LIVING ENVIRONMENT: Lives with: lives with their family Lives in: House/apartment Stairs: Yes: Internal: 12 steps; can reach both Has following equipment at home: None  OCCUPATION: Driver   PRECAUTIONS: None ---------------------------------------------------------------------------------------------  SUBJECTIVE:                                                                                                                                                                                           SUBJECTIVE STATEMENT: Patient reports that his back is feeling ok today, states that the core exercises have been helping his pain.  Eval statement 08/16/2024: no MOI, pt started noting back pain R>L. Is worried about the potential of a tumor on the inside of the body, feels like extra weight. Had back surgery on L3/L4 in 2014. Had an xray at [facility], indicated that previous surgery was intact. Pain started 6 months ago sporadically, constant pain, notices the pain most when going to stand up after sitting for a long time.  Pain wakes him up at night, especially when laying on L side, feels like something is shifting.  Pain currently 0/10 gets up to 4/10 No swelling, no abnormal n//t compared to PLOF    RED FLAGS: Bowel or bladder incontinence: No and Spinal tumors: No    PLOF: Independent  PATIENT GOALS: help pain  NEXT MD VISIT: need to schedule ---------------------------------------------------------------------------------------------  OBJECTIVE:  Note: Objective measures were completed at Evaluation unless otherwise noted.  DIAGNOSTIC FINDINGS:  No  recent pertinent imaging in pt chart, pt reports recent xrays with [facility]  PATIENT SURVEYS:  MODI:12/50 (24%)  COGNITION: Overall cognitive status: Within functional limits for tasks assessed   PALPATION: Tenderness to R lumbar paraspinals  Lumbar contraction pattern  L Multifidus:  R Multifidus:    SENSATION: WFL  MUSCLE LENGTH: Hamstrings: Right 20; deg; Left 20 deg   POSTURE: flexed trunk    LUMBAR ROM:   AROM eval  Flexion 80%  Extension 70%  Right lateral flexion 70%  Left lateral flexion 70%  Right rotation 60%  Left rotation 60%   (Blank rows = not tested)  ! Indicates pain with testing  LOWER EXTREMITY ROM:     Active  Right eval Left eval  Hip flexion    Hip extension    Hip abduction    Hip adduction    Hip internal rotation    Hip external rotation    Knee flexion    Knee extension    Ankle dorsiflexion    Ankle plantarflexion    Ankle inversion    Ankle eversion     (Blank rows = not tested)  ! Indicates pain with testing  LOWER EXTREMITY MMT:    MMT Right eval Left eval  Hip flexion 4 4  Hip extension 4+ 5  Hip abduction 4 4  Hip adduction    Hip internal rotation    Hip external rotation    Knee flexion    Knee extension    Ankle dorsiflexion    Ankle plantarflexion    Ankle inversion    Ankle eversion     (Blank rows = not tested)   ! Indicates pain with testing LUMBAR SPECIAL TESTS:  Prone instability test: Positive GAIT: Distance walked: 100' Assistive device utilized: None Level of assistance: Complete Independence Comments: flexed trunk   [facility] Adult PT Treatment:                                                DATE: 08/30/24 Therapeutic Exercise: Nustep level 5 x 5 mins while gathering subjective info Seated hamstring stretch 2x30 BIL SLR x10 BIL Sidelying clamshells or hip abduction with for slight ext bias 2x10 Neuromuscular re-ed: Seated pball press downs 5 hold 2x10 Supine 90/90 isometric hold  2x30 Supine 90/90 with alternating heel taps 2x30 Bridges 5 hold 2x10 Therapeutic Activity: STS arms crossed x10   [facility] Adult PT Treatment:                                                DATE: 08/16/2024  Self Care: Pt education POC discussion                                                                                                                                PATIENT EDUCATION:  Education details: Pt received education regarding HEP performance, ADL performance, functional activity tolerance, impairment education, appropriate performance of  therapeutic activities.  Person educated: Patient Education method: Explanation, Demonstration, Tactile cues, Verbal cues, and Handouts Education comprehension: verbalized understanding and returned demonstration  HOME EXERCISE PROGRAM: Access Code: [email or web address] URL: [email or web address] Date: 08/16/2024 Prepared by: Mabel Kiang  Exercises - Supine Quadratus Lumborum Stretch  - 1-3 x daily - 7 x weekly - 2 sets - 1 reps - 34m hold - Seated Hamstring Stretch  - 1-3 x daily - 7 x weekly - 2 sets - 1 reps - 35m hold - Supine Bridge  - 1 x daily - 4 x weekly - 2-3 sets - 12 reps - 4s hold - Supine 90/90 Alternating Toe Touch  - 1 x daily - 4 x weekly - 2-3 sets - 10 reps - 2s hold ---------------------------------------------------------------------------------------------  ASSESSMENT:  CLINICAL IMPRESSION: Patient presents to first follow up PT session reporting improvements in his lower back pain and that he has been compliant with his HEP. Session today focused on core strengthening as well as hip and lumbar mobility. Patient was able to tolerate all prescribed exercises with no adverse effects. Patient continues to benefit from skilled PT services and should be progressed as able to improve functional independence.   Eval impression (08/16/2024): Pt. attended today's physical therapy session for evaluation of LBP. Pt  has complaints of 4/10 LBP that worsens after prolonged posturing. Pt is worried about systemic growth in the low back d/t a feeling of heaviness and shifting when laying on L side. Pt has no red flags or systemic presentation at eval aside from slight discomfort at night. Pt has notable deficits and would benefit from therapeutic focus on lumbar stability, marked ROM deficits, proximal hip strength, dynamic core activation patterns, and myofascial limitations throughout  thoracolumbar region.  Treatment performed today focused on pt education detailed in the objective. Pt demonstrated good understanding of education provided. required minimal v/t cues and no assistance for appropriate performance with today's activities. Pt requires the intervention of skilled outpatient physical therapy to address the aforementioned deficits and progress towards a functional level in line with therapeutic goals.    OBJECTIVE IMPAIRMENTS: Abnormal gait, decreased activity tolerance, decreased mobility, difficulty walking, decreased ROM, decreased strength, hypomobility, increased fascial restrictions, improper body mechanics, postural dysfunction, and pain.   ACTIVITY LIMITATIONS: carrying, lifting, sitting, standing, squatting, locomotion level, and caring for others  PARTICIPATION LIMITATIONS: meal prep, cleaning, driving, shopping, community activity, and occupation  PERSONAL FACTORS: Age, Fitness, Past/current experiences, Profession, and Time since onset of injury/illness/exacerbation are also affecting patient's functional outcome.   REHAB POTENTIAL: Good  CLINICAL DECISION MAKING: Stable/uncomplicated  EVALUATION COMPLEXITY: Low   GOALS: Goals reviewed with patient? YES  SHORT TERM GOALS: Target date: 09/06/2024  Pt will be independent with administered HEP to demonstrate the competency necessary for long term managemnet of symptoms at home. Baseline: Goal status: INITIAL   LONG TERM GOALS:  Target date: 09/27/2024  Pt. Will achieve a MODI score of 6/50 (12%) as to demonstrate improvement in self-perceived functional ability with daily activities.  Baseline: 12/50 (24%) Goal status: INITIAL  2.  Pt will improve Global hip strength to a 4+/5 to demonstrate improvement in strength for quality of motion and activity performance.  Baseline:  Goal status: INITIAL  3.  Pt will improve Lumbar AROM to 90% of standardized norms with less than 2/10 pain to demonstrate necessary mobility for high quality and safe ADLs  Baseline:  Goal status: INITIAL  4.  Pt will report the ability to stand for >/= 20 minutes  as to demonstrate improved tolerance to standing for prolonged time and improved ability to participate in ADLs.  Baseline: 5 Goal status: INITIAL  ---------------------------------------------------------------------------------------------  PLAN:  PT FREQUENCY: 1-2x/week  PT DURATION: 6 weeks  PLANNED INTERVENTIONS: 97110-Therapeutic exercises, 97530- Therapeutic activity, [id number]- Neuromuscular re-education, 97535- Self Care, [id number]- Manual therapy, [phone]- Gait training, [phone]- Aquatic Therapy, Patient/Family education, Balance training, Stair training, Taping, Joint mobilization, Spinal mobilization, and Moist heat.  PLAN FOR NEXT SESSION: Review HEP, Begin POC as detailed in assessment   Corean Pouch PTA  08/30/2024, 9:10 AM

## 2024-09-01 ENCOUNTER — Ambulatory Visit: Admitting: Gastroenterology

## 2024-09-01 ENCOUNTER — Encounter: Payer: Self-pay | Admitting: Gastroenterology

## 2024-09-01 ENCOUNTER — Telehealth: Payer: Self-pay | Admitting: *Deleted

## 2024-09-01 VITALS — BP 112/70 | HR 75 | Ht 72.0 in | Wt 243.0 lb

## 2024-09-01 DIAGNOSIS — Z7901 Long term (current) use of anticoagulants: Secondary | ICD-10-CM | POA: Diagnosis not present

## 2024-09-01 DIAGNOSIS — R195 Other fecal abnormalities: Secondary | ICD-10-CM | POA: Insufficient documentation

## 2024-09-01 DIAGNOSIS — Z01810 Encounter for preprocedural cardiovascular examination: Secondary | ICD-10-CM

## 2024-09-01 MED ORDER — NA SULFATE-K SULFATE-MG SULF 17.5-3.13-1.6 GM/177ML PO SOLN: 1.0000 | Freq: Once | ORAL | 0 refills | Status: AC

## 2024-09-01 NOTE — Addendum Note (Signed)
 Addended by: GEROME IVAL SAILOR on: 09/01/2024 04:57 PM   Modules accepted: Orders

## 2024-09-01 NOTE — Telephone Encounter (Signed)
 [facility] Pre-operative Risk Assessment     Request for surgical clearance:     Endoscopy Procedure  What type of surgery is being performed?     colonoscopy  When is this surgery scheduled?     09/13/24  What type of clearance is required ?   Pharmacy  Are there any medications that need to be held prior to surgery and how long? Eliquis  2 days  Practice name and name of physician performing surgery?      [facility]  What is your office phone and fax number?      Phone- [phone]  Fax- [phone]  Anesthesia type (None, local, MAC, general) ?       MAC   Please route your response to Powell Misty, CMA

## 2024-09-01 NOTE — Progress Notes (Signed)
 09/01/2024 Oscar [name] [id number] 01-22-1946  Discussed the use of AI scribe software for clinical note transcription with the patient, who gave verbal consent to proceed.  History of Present Illness Oscar [name] is a 78 year old male who presents with blood in the stool. He was referred by Dr. Valentin from [facility] for evaluation of microscopic blood in the stool.  Patient of Dr. Clayburn.  He noticed blood in his stool on one occasion while wiping, experiencing this twice in one day, once in the morning and again in the afternoon or evening, and possibly one other time. After these instances, he did not notice any further blood since then. Approximately a month to a month and a half later, a stool test (fecal immunochemistry) detected microscopic blood. No recent episodes of visible blood in the stool, constipation, diarrhea, abdominal pain, or rectal pain. Bowel movements are regular and not associated with straining.  He has been on Eliquis  for a couple of years following bypass surgery in 2022 for some perioperative atrial fibrillation, but has had no recurrence to their knowledge.  Follows with Dr. Mona.  Last labs that I see were from February 2025 with a hemoglobin of 15.1 g.  At that same time his ferritin was slightly low at 22, but iron saturation was normal at 35%, serum iron 107, TIBC normal at 310.   Colonoscopy 08/2018:  - Redundant colon. - One 4 mm polyp in the ascending colon, removed with a cold snare. Resected and retrieved. - Diverticulosis in the left colon. - The examination was otherwise normal on direct and retroflexion views.  Tubular adenoma.  Past Medical History:  Diagnosis Date   Anesthesia complication    PER PT, HARD TO WAKE' UP PAST SHOULDER SURGERY IN 2016   Arthritis    lumbar, spondylosis, DDD,stenosis    Atrial premature depolarization    Chronic right hip pain    and right leg pain   Coronary artery disease    Diabetes  mellitus without complication (HCC)    Dysrhythmia    pt. reports that he has been told in the past that he has  irreg. heattbeat    Edema    Gout    bil ankles   Hyperlipidemia    Hypertension    Rotator cuff tear    right   Shortness of breath    Swelling of extremity    bil feet   Type 2 diabetes mellitus with hyperglycemia [facility])    Past Surgical History:  Procedure Laterality Date   BACK SURGERY     January 2014   CARDIAC CATHETERIZATION     2004, outcome good, to be followed for/with medical therapy    CORONARY ARTERY BYPASS GRAFT N/A 07/16/2021   Procedure: CORONARY ARTERY BYPASS GRAFTING (CABG) X FIVE, USING LEFT INTERNAL MAMMARY ARTERY AND RIGHT LEG GREATER SAPHENOUS VEIN HARVESTED ENDOSCOPICALLY;  Surgeon: Kerrin Elspeth BROCKS, MD;  Location: [facility] OR;  Service: Open Heart Surgery;  Laterality: N/A;   ELBOW BURSA SURGERY     removed bursacs both elbows   ENDOVEIN HARVEST OF GREATER SAPHENOUS VEIN  07/16/2021   Procedure: ENDOVEIN HARVEST OF GREATER SAPHENOUS VEIN;  Surgeon: Kerrin Elspeth BROCKS, MD;  Location: [facility] OR;  Service: Open Heart Surgery;;   FASCIECTOMY Left 12/12/2020   Procedure: FASCIECTOMY LEFT SMALL FINGER;  Surgeon: Murrell Kuba, MD;  Location: [facility];  Service: Orthopedics;  Laterality: Left;   LEFT HEART CATH AND CORONARY  ANGIOGRAPHY N/A 07/12/2021   Procedure: LEFT HEART CATH AND CORONARY ANGIOGRAPHY;  Surgeon: Verlin Lonni BIRCH, MD;  [facility]: [facility];  Service: Cardiovascular;  Laterality: N/A;   SHOULDER ARTHROSCOPY WITH ROTATOR CUFF REPAIR AND SUBACROMIAL DECOMPRESSION Right 08/21/2015   Procedure: SHOULDER ARTHROSCOPY WITH ROTATOR CUFF REPAIR S UBACROMIAL DECOMPRESSION, ;  Surgeon: Eva Herring, MD;  Location:  SURGERY CENTER;  Service: Orthopedics;  Laterality: Right;  Right shoulder arthroscopy rotator cuff repair vs debridement, subacromial decompression, possible biceps tenotomy.   TEE WITHOUT CARDIOVERSION  N/A 07/16/2021   Procedure: TRANSESOPHAGEAL ECHOCARDIOGRAM (TEE);  Surgeon: Kerrin Elspeth BROCKS, MD;  Location: [facility] OR;  Service: Open Heart Surgery;  Laterality: N/A;    reports that he quit smoking about 50 years ago. His smoking use included cigarettes. He started smoking about 60 years ago. He has a 3 pack-year smoking history. He has never used smokeless tobacco. He reports that he does not currently use alcohol. He reports that he does not use drugs. family history includes Diabetes in his father and paternal grandmother; Heart disease in his brother and father. Allergies  Allergen Reactions   Yellow Fever Vaccine Hives      Outpatient Encounter Medications as of 09/01/2024  Medication Sig   apixaban  (ELIQUIS ) 5 MG TABS tablet TAKE ONE TABLET BY MOUTH TWICE DAILY   atorvastatin  (LIPITOR ) 80 MG tablet TAKE ONE TABLET BY MOUTH ONE TIME DAILY   furosemide  (LASIX ) 20 MG tablet TAKE ONE TABLET BY MOUTH ONE TIME DAILY   halobetasol (ULTRAVATE) 0.05 % cream Apply 1 application topically 2 (two) times daily as needed (psoriasis).   hydrocortisone  2.5 % cream Apply topically 2 (two) times daily. (Patient taking differently: Apply 1 application  topically 2 (two) times daily as needed (psoriasis).)   losartan  (COZAAR ) 50 MG tablet Take by mouth.   Magnesium  250 MG TABS Take 250 mg by mouth in the morning.   metFORMIN  (GLUCOPHAGE ) 500 MG tablet Take 500-1,000 mg by mouth See admin instructions. Take 1 tablet (500 mg) by mouth in the morning & take 2 tablets (1000 mg) by mouth in the afternoon.   metoprolol  succinate (TOPROL -XL) 25 MG 24 hr tablet Take one-half tablet by mouth at bedtime   Na Sulfate-K Sulfate-Mg Sulfate concentrate (SUPREP) 17.5-3.13-1.6 GM/177ML SOLN Take 1 kit (354 mLs total) by mouth once for 1 dose.   omeprazole (PRILOSEC OTC) 20 MG tablet Take 20 mg by mouth daily as needed (indigestion/heartburn).   potassium chloride  SA (KLOR-CON  M) 20 MEQ tablet TAKE ONE TABLET BY MOUTH  ONCE A DAY   vitamin B-12 (CYANOCOBALAMIN ) 500 MCG tablet Take 500 mcg by mouth in the morning.   Facility-Administered Encounter Medications as of 09/01/2024  Medication   technetium tetrofosmin  (TC-MYOVIEW ) injection 31.6 millicurie     REVIEW OF SYSTEMS  : All other systems reviewed and negative except where noted in the History of Present Illness.   PHYSICAL EXAM: BP 112/70   Pulse 75   Ht 6' (1.829 m)   Wt 243 lb (110.2 kg)   SpO2 99%   BMI 32.96 kg/m  General: Well developed AA male in no acute distress Head: Normocephalic and atraumatic Eyes:  Sclerae anicteric, conjunctiva pink. Ears: Normal auditory acuity Lungs: Clear throughout to auscultation; no W/R/R. Heart: Regular rate and rhythm; no M/R/G. Rectal:  Will be done at the time of colonoscopy. Musculoskeletal: Symmetrical with no gross deformities  Skin: No lesions on visible extremities Neurological: Alert oriented x 4, grossly non-focal Psychological:  Alert and cooperative. Normal mood and affect Assessment & Plan Rectal bleeding and positive fecal immunochemistry test Intermittent rectal bleeding with a positive fecal immunochemistry test indicating microscopic blood. No recent visible blood in stool and none around the time of his fecal immunochemistry testing.  No associated symptoms such as constipation, diarrhea, abdominal pain, or hemorrhoids. On Eliquis  for anticoagulation for history of atrial fibrillation (only at the time of his CABG in 2022). Differential includes hemorrhoids, but none noted on previous colonoscopy. Persistent microscopic blood warrants further evaluation. - Schedule colonoscopy with Dr. Legrand to evaluate source of bleeding - Stop Eliquis  two days prior to colonoscopy - Contact cardiologist (Dr. Mona) for approval to hold Eliquis     CC:  Valentin Skates, DO

## 2024-09-01 NOTE — Patient Instructions (Signed)
 You will be contacted by our office prior to your procedure for directions on holding your Eliquis .  If you do not hear from our office 1 week prior to your scheduled procedure, please call [phone] to discuss.   You have been scheduled for a colonoscopy. Please follow written instructions given to you at your visit today.   If you use inhalers (even only as needed), please bring them with you on the day of your procedure.  DO NOT TAKE 7 DAYS PRIOR TO TEST- Trulicity (dulaglutide) Ozempic, Wegovy (semaglutide) Mounjaro (tirzepatide) Bydureon Bcise (exanatide extended release)  DO NOT TAKE 1 DAY PRIOR TO YOUR TEST Rybelsus (semaglutide) Adlyxin (lixisenatide) Victoza (liraglutide) Byetta (exanatide) ___________________________________________________________________________

## 2024-09-01 NOTE — Telephone Encounter (Signed)
 Patient needs CBC and BMET for pharmacy recommendations concerning Eliquis  hold. Thank you,

## 2024-09-01 NOTE — Telephone Encounter (Signed)
 Attempted to contact the patient that labs are needed prior to preop clearance eval. Orders placed. [id number]

## 2024-09-02 ENCOUNTER — Telehealth (HOSPITAL_BASED_OUTPATIENT_CLINIC_OR_DEPARTMENT_OTHER): Payer: Self-pay | Admitting: *Deleted

## 2024-09-02 NOTE — Telephone Encounter (Signed)
 S/w the pt and he is agreeable to get labs done. Pt said he will get labs Monday 09/06/24. Pt did not have a pen on him as he was in the car right now. I said I will send through [facility] CHART for him.   BMET and CBC to be done. Orders have been placed and release to [employer].

## 2024-09-02 NOTE — Progress Notes (Signed)
 ____________________________________________________________  Attending physician addendum:  Thank you for sending this case to me. I have reviewed the entire note and agree with the plan.   Victory Brand, MD  ____________________________________________________________

## 2024-09-02 NOTE — Telephone Encounter (Signed)
 Me to Oscar [name]     09/02/24  2:19 PM S/w the pt and he is agreeable to get labs done. Pt said he will get labs Monday 09/06/24. Pt did not have a pen on him as he was in the car right now. I said I will send through [facility] CHART for him.    BMET and CBC to be done. Orders have been placed and release to [employer].    Address to the new building is: [address] [place], [place]  This MyChart message has not been read.

## 2024-09-05 NOTE — Therapy (Unsigned)
 OUTPATIENT PHYSICAL THERAPY TREATMENT NOTE   Patient Name: DEMONTEZ NOVACK MRN: [id number] DOB:1946/03/20, 78 y.o., male Today's Date: 09/06/2024  END OF SESSION:   Past Medical History:  Diagnosis Date   Anesthesia complication    PER PT, HARD TO WAKE' UP PAST SHOULDER SURGERY IN 2016   Arthritis    lumbar, spondylosis, DDD,stenosis    Atrial premature depolarization    Chronic right hip pain    and right leg pain   Coronary artery disease    Diabetes mellitus without complication (HCC)    Dysrhythmia    pt. reports that he has been told in the past that he has  irreg. heattbeat    Edema    Gout    bil ankles   Hyperlipidemia    Hypertension    Rotator cuff tear    right   Shortness of breath    Swelling of extremity    bil feet   Type 2 diabetes mellitus with hyperglycemia [facility])    Past Surgical History:  Procedure Laterality Date   BACK SURGERY     January 2014   CARDIAC CATHETERIZATION     2004, outcome good, to be followed for/with medical therapy    CORONARY ARTERY BYPASS GRAFT N/A 07/16/2021   Procedure: CORONARY ARTERY BYPASS GRAFTING (CABG) X FIVE, USING LEFT INTERNAL MAMMARY ARTERY AND RIGHT LEG GREATER SAPHENOUS VEIN HARVESTED ENDOSCOPICALLY;  Surgeon: Kerrin Elspeth BROCKS, MD;  Location: [facility] OR;  Service: Open Heart Surgery;  Laterality: N/A;   ELBOW BURSA SURGERY     removed bursacs both elbows   ENDOVEIN HARVEST OF GREATER SAPHENOUS VEIN  07/16/2021   Procedure: ENDOVEIN HARVEST OF GREATER SAPHENOUS VEIN;  Surgeon: Kerrin Elspeth BROCKS, MD;  Location: [facility] OR;  Service: Open Heart Surgery;;   FASCIECTOMY Left 12/12/2020   Procedure: FASCIECTOMY LEFT SMALL FINGER;  Surgeon: Murrell Kuba, MD;  Location: [facility];  Service: Orthopedics;  Laterality: Left;   LEFT HEART CATH AND CORONARY ANGIOGRAPHY N/A 07/12/2021   Procedure: LEFT HEART CATH AND CORONARY ANGIOGRAPHY;  Surgeon: Verlin Lonni BIRCH, MD;  Location: [facility];  Service:  Cardiovascular;  Laterality: N/A;   SHOULDER ARTHROSCOPY WITH ROTATOR CUFF REPAIR AND SUBACROMIAL DECOMPRESSION Right 08/21/2015   Procedure: SHOULDER ARTHROSCOPY WITH ROTATOR CUFF REPAIR S UBACROMIAL DECOMPRESSION, ;  Surgeon: Eva Herring, MD;  Location: [facility];  Service: Orthopedics;  Laterality: Right;  Right shoulder arthroscopy rotator cuff repair vs debridement, subacromial decompression, possible biceps tenotomy.   TEE WITHOUT CARDIOVERSION N/A 07/16/2021   Procedure: TRANSESOPHAGEAL ECHOCARDIOGRAM (TEE);  Surgeon: Kerrin Elspeth BROCKS, MD;  Location: [facility] OR;  Service: Open Heart Surgery;  Laterality: N/A;   Patient Active Problem List   Diagnosis Date Noted   Positive FIT (fecal immunochemical test) 09/01/2024   S/P CABG x 5    Unstable angina (HCC) 07/12/2021   Coronary artery disease involving native coronary artery of native heart with unstable angina pectoris (HCC)    Essential hypertension 01/02/2021   Dyslipidemia, goal LDL below 70 01/02/2021   Obesity (BMI 30-39.9) 01/02/2021   Palpitations 01/02/2021   Cold extremities 03/02/2018   Pain of lower extremity 03/02/2018   Decreased pedal pulses 03/02/2018   Dyspnea 06/28/2016   Abnormal EKG 06/28/2016   Premature atrial contractions 06/28/2016   Family history of heart disease 06/28/2016   CAD in native artery 06/28/2016    PCP: Valentin Skates, DO  REFERRING PROVIDER: Tressia Duncans, MD  REFERRING DIAG: Spondylolisthesis, lumbar region [  M43.16]   Rationale for Evaluation and Treatment: Rehabilitation  THERAPY DIAG:  Other low back pain  Unspecified soft tissue disorder related to use, overuse and pressure multiple sites  Muscle weakness (generalized)  PERTINENT HISTORY: CAD, unstable angina, s/p, CABG x5, HTN  WEIGHT BEARING RESTRICTIONS: No  FALLS:  Has patient fallen in last 6 months? No  LIVING ENVIRONMENT: Lives with: lives with their family Lives in: House/apartment Stairs:  Yes: Internal: 12 steps; can reach both Has following equipment at home: None  OCCUPATION: Driver   PRECAUTIONS: None ---------------------------------------------------------------------------------------------  SUBJECTIVE:                                                                                                                                                                                           SUBJECTIVE STATEMENT: Low back pain has lessened, feel pretty good this AM, no marked discomfort to report.  Has been compliant with HEP  Eval statement 08/16/2024: no MOI, pt started noting back pain R>L. Is worried about the potential of a tumor on the inside of the body, feels like extra weight. Had back surgery on L3/L4 in 2014. Had an xray at [facility], indicated that previous surgery was intact. Pain started 6 months ago sporadically, constant pain, notices the pain most when going to stand up after sitting for a long time.  Pain wakes him up at night, especially when laying on L side, feels like something is shifting.  Pain currently 0/10 gets up to 4/10 No swelling, no abnormal n//t compared to PLOF    RED FLAGS: Bowel or bladder incontinence: No and Spinal tumors: No    PLOF: Independent  PATIENT GOALS: help pain  NEXT MD VISIT: need to schedule ---------------------------------------------------------------------------------------------  OBJECTIVE:  Note: Objective measures were completed at Evaluation unless otherwise noted.  DIAGNOSTIC FINDINGS:  No recent pertinent imaging in pt chart, pt reports recent xrays with [facility]  PATIENT SURVEYS:  MODI:12/50 (24%)  COGNITION: Overall cognitive status: Within functional limits for tasks assessed   PALPATION: Tenderness to R lumbar paraspinals  Lumbar contraction pattern  L Multifidus:  R Multifidus:    SENSATION: WFL  MUSCLE LENGTH: Hamstrings: Right 20; deg; Left 20 deg   POSTURE: flexed  trunk    LUMBAR ROM:   AROM eval  Flexion 80%  Extension 70%  Right lateral flexion 70%  Left lateral flexion 70%  Right rotation 60%  Left rotation 60%   (Blank rows = not tested)  ! Indicates pain with testing  LOWER EXTREMITY ROM:     Active  Right eval Left eval  Hip flexion    Hip extension  Hip abduction    Hip adduction    Hip internal rotation    Hip external rotation    Knee flexion    Knee extension    Ankle dorsiflexion    Ankle plantarflexion    Ankle inversion    Ankle eversion     (Blank rows = not tested)  ! Indicates pain with testing  LOWER EXTREMITY MMT:    MMT Right eval Left eval  Hip flexion 4 4  Hip extension 4+ 5  Hip abduction 4 4  Hip adduction    Hip internal rotation    Hip external rotation    Knee flexion    Knee extension    Ankle dorsiflexion    Ankle plantarflexion    Ankle inversion    Ankle eversion     (Blank rows = not tested)   ! Indicates pain with testing LUMBAR SPECIAL TESTS:  Prone instability test: Positive GAIT: Distance walked: 100' Assistive device utilized: None Level of assistance: Complete Independence Comments: flexed trunk   [facility] Adult PT Treatment:                                                DATE: 09/06/24 Therapeutic Exercise: Nustep L4 8 min Neuromuscular re-ed: Supine hip fallouts RTB 15x B, 15/15 S/L clams RTB 15/15 P-ball curl ups 15x B, 15/15 Bridge against RTB 15x Therapeutic Activity: Seated hamstring stretch 30s x2 B Supine QL stretch 30s x2 B  [facility] Adult PT Treatment:                                                DATE: 08/30/24 Therapeutic Exercise: Nustep level 5 x 5 mins while gathering subjective info Seated hamstring stretch 2x30 BIL SLR x10 BIL Sidelying clamshells or hip abduction with for slight ext bias 2x10 Neuromuscular re-ed: Seated pball press downs 5 hold 2x10 Supine 90/90 isometric hold 2x30 Supine 90/90 with alternating heel taps 2x30 Bridges 5 hold  2x10 Therapeutic Activity: STS arms crossed x10   [facility] Adult PT Treatment:                                                DATE: 08/16/2024  Self Care: Pt education POC discussion                                                                                                                                PATIENT EDUCATION:  Education details: Pt received education regarding HEP performance, ADL performance, functional activity tolerance, impairment education, appropriate performance  of therapeutic activities.  Person educated: Patient Education method: Explanation, Demonstration, Tactile cues, Verbal cues, and Handouts Education comprehension: verbalized understanding and returned demonstration  HOME EXERCISE PROGRAM: Access Code: [email or web address] URL: [email or web address] Date: 08/16/2024 Prepared by: Mabel Kiang  Exercises - Supine Quadratus Lumborum Stretch  - 1-3 x daily - 7 x weekly - 2 sets - 1 reps - 31m hold - Seated Hamstring Stretch  - 1-3 x daily - 7 x weekly - 2 sets - 1 reps - 68m hold - Supine Bridge  - 1 x daily - 4 x weekly - 2-3 sets - 12 reps - 4s hold - Supine 90/90 Alternating Toe Touch  - 1 x daily - 4 x weekly - 2-3 sets - 10 reps - 2s hold ---------------------------------------------------------------------------------------------  ASSESSMENT:  CLINICAL IMPRESSION: Focus of session was primarily stretching of low back and LE's.  Continued core strengthening and added additional  hip strengthening and stabilizing tasks.  Added more AROM to abdominal work introducing curl ups with p-ball.  Able to perform all requested tasks w/o symptom aggravation.  Eval impression (08/16/2024): Pt. attended today's physical therapy session for evaluation of LBP. Pt has complaints of 4/10 LBP that worsens after prolonged posturing. Pt is worried about systemic growth in the low back d/t a feeling of heaviness and shifting when laying on L side. Pt has no red flags  or systemic presentation at eval aside from slight discomfort at night. Pt has notable deficits and would benefit from therapeutic focus on lumbar stability, marked ROM deficits, proximal hip strength, dynamic core activation patterns, and myofascial limitations throughout  thoracolumbar region.  Treatment performed today focused on pt education detailed in the objective. Pt demonstrated good understanding of education provided. required minimal v/t cues and no assistance for appropriate performance with today's activities. Pt requires the intervention of skilled outpatient physical therapy to address the aforementioned deficits and progress towards a functional level in line with therapeutic goals.    OBJECTIVE IMPAIRMENTS: Abnormal gait, decreased activity tolerance, decreased mobility, difficulty walking, decreased ROM, decreased strength, hypomobility, increased fascial restrictions, improper body mechanics, postural dysfunction, and pain.   ACTIVITY LIMITATIONS: carrying, lifting, sitting, standing, squatting, locomotion level, and caring for others  PARTICIPATION LIMITATIONS: meal prep, cleaning, driving, shopping, community activity, and occupation  PERSONAL FACTORS: Age, Fitness, Past/current experiences, Profession, and Time since onset of injury/illness/exacerbation are also affecting patient's functional outcome.   REHAB POTENTIAL: Good  CLINICAL DECISION MAKING: Stable/uncomplicated  EVALUATION COMPLEXITY: Low   GOALS: Goals reviewed with patient? YES  SHORT TERM GOALS: Target date: 09/06/2024  Pt will be independent with administered HEP to demonstrate the competency necessary for long term managemnet of symptoms at home. Baseline: Goal status: Met   LONG TERM GOALS: Target date: 09/27/2024  Pt. Will achieve a MODI score of 6/50 (12%) as to demonstrate improvement in self-perceived functional ability with daily activities.  Baseline: 12/50 (24%) Goal status:  INITIAL  2.  Pt will improve Global hip strength to a 4+/5 to demonstrate improvement in strength for quality of motion and activity performance.  Baseline:  Goal status: INITIAL  3.  Pt will improve Lumbar AROM to 90% of standardized norms with less than 2/10 pain to demonstrate necessary mobility for high quality and safe ADLs  Baseline:  Goal status: INITIAL  4.  Pt will report the ability to stand for >/= 20 minutes as to demonstrate improved tolerance to standing for prolonged time and improved ability to participate in ADLs.  Baseline: 5 Goal  status: INITIAL  ---------------------------------------------------------------------------------------------  PLAN:  PT FREQUENCY: 1-2x/week  PT DURATION: 6 weeks  PLANNED INTERVENTIONS: 97110-Therapeutic exercises, 97530- Therapeutic activity, [id number]- Neuromuscular re-education, 97535- Self Care, [id number]- Manual therapy, [phone]- Gait training, [phone]- Aquatic Therapy, Patient/Family education, Balance training, Stair training, Taping, Joint mobilization, Spinal mobilization, and Moist heat.  PLAN FOR NEXT SESSION: Review HEP, Begin POC as detailed in assessment   Reyes CHRISTELLA Kohut PT  09/06/2024, 9:13 AM

## 2024-09-06 ENCOUNTER — Encounter: Payer: Self-pay | Admitting: Gastroenterology

## 2024-09-06 ENCOUNTER — Other Ambulatory Visit: Payer: Self-pay

## 2024-09-06 ENCOUNTER — Ambulatory Visit: Attending: Neurosurgery

## 2024-09-06 DIAGNOSIS — M7099 Unspecified soft tissue disorder related to use, overuse and pressure multiple sites: Secondary | ICD-10-CM | POA: Insufficient documentation

## 2024-09-06 DIAGNOSIS — M48062 Spinal stenosis, lumbar region with neurogenic claudication: Secondary | ICD-10-CM | POA: Diagnosis not present

## 2024-09-06 DIAGNOSIS — Z01810 Encounter for preprocedural cardiovascular examination: Secondary | ICD-10-CM | POA: Diagnosis not present

## 2024-09-06 DIAGNOSIS — M6281 Muscle weakness (generalized): Secondary | ICD-10-CM | POA: Insufficient documentation

## 2024-09-06 DIAGNOSIS — Z6833 Body mass index (BMI) 33.0-33.9, adult: Secondary | ICD-10-CM | POA: Diagnosis not present

## 2024-09-06 DIAGNOSIS — M5459 Other low back pain: Secondary | ICD-10-CM | POA: Diagnosis not present

## 2024-09-07 LAB — CBC
Hematocrit: 46.3 % (ref 37.5–51.0)
Hemoglobin: 15.1 g/dL (ref 13.0–17.7)
MCH: 31.4 pg (ref 26.6–33.0)
MCHC: 32.6 g/dL (ref 31.5–35.7)
MCV: 96 fL (ref 79–97)
Platelets: 143 x10E3/uL — ABNORMAL LOW (ref 150–450)
RBC: 4.81 x10E6/uL (ref 4.14–5.80)
RDW: 11.6 % (ref 11.6–15.4)
WBC: 4.2 x10E3/uL (ref 3.4–10.8)

## 2024-09-07 LAB — BASIC METABOLIC PANEL WITH GFR
BUN/Creatinine Ratio: 13 (ref 10–24)
BUN: 23 mg/dL (ref 8–27)
CO2: 19 mmol/L — ABNORMAL LOW (ref 20–29)
Calcium: 9 mg/dL (ref 8.6–10.2)
Chloride: 107 mmol/L — ABNORMAL HIGH (ref 96–106)
Creatinine, Ser: 1.81 mg/dL — ABNORMAL HIGH (ref 0.76–1.27)
Glucose: 79 mg/dL (ref 70–99)
Potassium: 4.4 mmol/L (ref 3.5–5.2)
Sodium: 144 mmol/L (ref 134–144)
eGFR: 38 mL/min/1.73 — ABNORMAL LOW (ref >59)

## 2024-09-07 NOTE — Telephone Encounter (Signed)
 Pt had labs done today, results are in the chart

## 2024-09-08 ENCOUNTER — Ambulatory Visit: Payer: Self-pay | Admitting: Internal Medicine

## 2024-09-08 NOTE — Telephone Encounter (Signed)
 Patient informed.

## 2024-09-08 NOTE — Telephone Encounter (Signed)
 Pharmacy please advise on holding Eliquis  prior to colonoscopy scheduled for 09/13/2024. Labs completed on 09/07/2024. Thank you.

## 2024-09-08 NOTE — Telephone Encounter (Signed)
 Patient with diagnosis of afib on Eliquis  for anticoagulation.    Procedure: colonoscopy  Date of procedure: 09/13/24   CHA2DS2-VASc Score = 5   This indicates a 7.2% annual risk of stroke. The patient's score is based upon: CHF History: 0 HTN History: 1 Diabetes History: 1 Stroke History: 0 Vascular Disease History: 1 Age Score: 2 Gender Score: 0      CrCl 43 ml/min Platelet count 143  Patient has not had an Afib/aflutter ablation in the last 3 months, DCCV within the last 4 weeks or a watchman implanted in the last 45 days   Per office protocol, patient can hold Eliquis  for 2 days prior to procedure.    **This guidance is not considered finalized until pre-operative APP has relayed final recommendations.**

## 2024-09-08 NOTE — Telephone Encounter (Signed)
   Patient Name: Oscar [name]  DOB: 1946-04-26 MRN: [id number]  Primary Cardiologist: Vinie JAYSON Maxcy, MD  Clinical pharmacists have reviewed the patient's past medical history, labs, and current medications as part of preoperative protocol coverage. The following recommendations have been made:  Patient with diagnosis of afib on Eliquis  for anticoagulation.     Procedure: colonoscopy  Date of procedure: 09/13/24     CHA2DS2-VASc Score = 5   This indicates a 7.2% annual risk of stroke. The patient's score is based upon: CHF History: 0 HTN History: 1 Diabetes History: 1 Stroke History: 0 Vascular Disease History: 1 Age Score: 2 Gender Score: 0    CrCl 43 ml/min Platelet count 143   Patient has not had an Afib/aflutter ablation in the last 3 months, DCCV within the last 4 weeks or a watchman implanted in the last 45 days    Per office protocol, patient can hold Eliquis  for 2 days prior to procedure.      I will route this recommendation to the requesting party via Epic fax function and remove from pre-op pool.  Please call with questions.  Lamarr Satterfield, NP 09/08/2024, 8:20 AM

## 2024-09-13 ENCOUNTER — Encounter

## 2024-09-13 ENCOUNTER — Ambulatory Visit: Admitting: Gastroenterology

## 2024-09-13 ENCOUNTER — Encounter: Payer: Self-pay | Admitting: Gastroenterology

## 2024-09-13 VITALS — BP 152/89 | HR 65 | Temp 97.8°F | Resp 17 | Ht 72.0 in | Wt 243.0 lb

## 2024-09-13 DIAGNOSIS — Z860101 Personal history of adenomatous and serrated colon polyps: Secondary | ICD-10-CM | POA: Diagnosis not present

## 2024-09-13 DIAGNOSIS — E785 Hyperlipidemia, unspecified: Secondary | ICD-10-CM | POA: Diagnosis not present

## 2024-09-13 DIAGNOSIS — R195 Other fecal abnormalities: Secondary | ICD-10-CM

## 2024-09-13 DIAGNOSIS — D123 Benign neoplasm of transverse colon: Secondary | ICD-10-CM

## 2024-09-13 DIAGNOSIS — K648 Other hemorrhoids: Secondary | ICD-10-CM

## 2024-09-13 DIAGNOSIS — K5730 Diverticulosis of large intestine without perforation or abscess without bleeding: Secondary | ICD-10-CM

## 2024-09-13 DIAGNOSIS — I10 Essential (primary) hypertension: Secondary | ICD-10-CM | POA: Diagnosis not present

## 2024-09-13 DIAGNOSIS — E119 Type 2 diabetes mellitus without complications: Secondary | ICD-10-CM | POA: Diagnosis not present

## 2024-09-13 DIAGNOSIS — I2510 Atherosclerotic heart disease of native coronary artery without angina pectoris: Secondary | ICD-10-CM | POA: Diagnosis not present

## 2024-09-13 DIAGNOSIS — Z1211 Encounter for screening for malignant neoplasm of colon: Secondary | ICD-10-CM

## 2024-09-13 MED ORDER — SODIUM CHLORIDE 0.9 % IV SOLN: 500.0000 mL | Freq: Once | INTRAVENOUS | Status: DC

## 2024-09-13 MED ORDER — DEXTROSE 5 % IV SOLN: INTRAVENOUS | Status: AC

## 2024-09-13 NOTE — Progress Notes (Signed)
 Sedate, gd SR, tolerated procedure well, VSS, report to RN

## 2024-09-13 NOTE — Progress Notes (Signed)
 Pt's states no medical or surgical changes since previsit or office visit.

## 2024-09-13 NOTE — Progress Notes (Signed)
 Called to room to assist during endoscopic procedure.  Patient ID and intended procedure confirmed with present staff. Received instructions for my participation in the procedure from the performing physician.

## 2024-09-13 NOTE — Progress Notes (Signed)
 History and Physical:  This patient presents for endoscopic testing for: Encounter Diagnosis  Name Primary?   Positive FIT (fecal immunochemical test) Yes    78 year old man here today for evaluation of FOBT positive test. Clinical details in 09/01/2024 office note with no significant clinical changes since then. This was found on routine stool testing by primary care after patient describes some blood on the toilet tissue about a month prior.  Adenomatous colon polyp October 2019.  Patient has been off his Eliquis  for last 2 days.  Patient is otherwise without complaints or active issues today.   Past Medical History: Past Medical History:  Diagnosis Date   Anesthesia complication    PER PT, HARD TO WAKE' UP PAST SHOULDER SURGERY IN 2016   Arthritis    lumbar, spondylosis, DDD,stenosis    Atrial premature depolarization    Chronic right hip pain    and right leg pain   Coronary artery disease    Diabetes mellitus without complication (HCC)    Dysrhythmia    pt. reports that he has been told in the past that he has  irreg. heattbeat    Edema    Gout    bil ankles   Hyperlipidemia    Hypertension    Rotator cuff tear    right   Shortness of breath    Swelling of extremity    bil feet   Type 2 diabetes mellitus with hyperglycemia [facility])      Past Surgical History: Past Surgical History:  Procedure Laterality Date   BACK SURGERY     January 2014   CARDIAC CATHETERIZATION     2004, outcome good, to be followed for/with medical therapy    CORONARY ARTERY BYPASS GRAFT N/A 07/16/2021   Procedure: CORONARY ARTERY BYPASS GRAFTING (CABG) X FIVE, USING LEFT INTERNAL MAMMARY ARTERY AND RIGHT LEG GREATER SAPHENOUS VEIN HARVESTED ENDOSCOPICALLY;  Surgeon: Kerrin Elspeth BROCKS, MD;  Location: [facility] OR;  Service: Open Heart Surgery;  Laterality: N/A;   ELBOW BURSA SURGERY     removed bursacs both elbows   ENDOVEIN HARVEST OF GREATER SAPHENOUS VEIN  07/16/2021   Procedure: ENDOVEIN  HARVEST OF GREATER SAPHENOUS VEIN;  Surgeon: Kerrin Elspeth BROCKS, MD;  Location[facility];  Service: Open Heart Surgery;;   FASCIECTOMY Left 12/12/2020   Procedure: FASCIECTOMY LEFT SMALL FINGER;  Surgeon: Murrell Kuba, MD;  Location: [facility];  Service: Orthopedics;  Laterality: Left;   LEFT HEART CATH AND CORONARY ANGIOGRAPHY N/A 07/12/2021   Procedure: LEFT HEART CATH AND CORONARY ANGIOGRAPHY;  Surgeon: Verlin Lonni BIRCH, MD;  Location: [facility];  Service: Cardiovascular;  Laterality: N/A;   SHOULDER ARTHROSCOPY WITH ROTATOR CUFF REPAIR AND SUBACROMIAL DECOMPRESSION Right 08/21/2015   Procedure: SHOULDER ARTHROSCOPY WITH ROTATOR CUFF REPAIR S UBACROMIAL DECOMPRESSION, ;  Surgeon: Eva Herring, MD;  Location: [facility];  Service: Orthopedics;  Laterality: Right;  Right shoulder arthroscopy rotator cuff repair vs debridement, subacromial decompression, possible biceps tenotomy.   TEE WITHOUT CARDIOVERSION N/A 07/16/2021   Procedure: TRANSESOPHAGEAL ECHOCARDIOGRAM (TEE);  Surgeon: Kerrin Elspeth BROCKS, MD;  Location: [facility] OR;  Service: Open Heart Surgery;  Laterality: N/A;    Allergies: Allergies  Allergen Reactions   Yellow Fever Vaccine Hives    Outpatient Meds: Current Outpatient Medications  Medication Sig Dispense Refill   atorvastatin  (LIPITOR ) 80 MG tablet TAKE ONE TABLET BY MOUTH ONE TIME DAILY 90 tablet 3   furosemide  (LASIX ) 20 MG tablet TAKE ONE TABLET BY MOUTH  ONE TIME DAILY 90 tablet 3   halobetasol (ULTRAVATE) 0.05 % cream Apply 1 application topically 2 (two) times daily as needed (psoriasis).     hydrocortisone  2.5 % cream Apply topically 2 (two) times daily. (Patient taking differently: Apply 1 application  topically 2 (two) times daily as needed (psoriasis).) 30 g 4   losartan  (COZAAR ) 50 MG tablet Take by mouth.     Magnesium  250 MG TABS Take 250 mg by mouth in the morning.     metFORMIN  (GLUCOPHAGE ) 500 MG tablet Take 500-1,000  mg by mouth See admin instructions. Take 1 tablet (500 mg) by mouth in the morning & take 2 tablets (1000 mg) by mouth in the afternoon.     metoprolol  succinate (TOPROL -XL) 25 MG 24 hr tablet Take one-half tablet by mouth at bedtime 30 tablet 3   omeprazole (PRILOSEC OTC) 20 MG tablet Take 20 mg by mouth daily as needed (indigestion/heartburn).     potassium chloride  SA (KLOR-CON  M) 20 MEQ tablet TAKE ONE TABLET BY MOUTH ONCE A DAY 90 tablet 0   vitamin B-12 (CYANOCOBALAMIN ) 500 MCG tablet Take 500 mcg by mouth in the morning.     apixaban  (ELIQUIS ) 5 MG TABS tablet TAKE ONE TABLET BY MOUTH TWICE DAILY 180 tablet 1   Current Facility-Administered Medications  Medication Dose Route Frequency Provider Last Rate Last Admin   0.9 %  sodium chloride  infusion  500 mL Intravenous Once Danis, Victory LITTIE MOULD, MD       dextrose  5 % solution   Intravenous Continuous Danis, Victory LITTIE MOULD, MD       Facility-Administered Medications Ordered in Other Visits  Medication Dose Route Frequency Provider Last Rate Last Admin   technetium tetrofosmin  (TC-MYOVIEW ) injection 31.6 millicurie  31.6 millicurie Intravenous Once PRN Pietro Redell RAMAN, MD          ___________________________________________________________________ Objective   Exam:  BP (!) 136/51   Pulse 60   Temp 97.8 F (36.6 C) (Temporal)   Resp 18   Ht 6' (1.829 m)   Wt 243 lb (110.2 kg)   SpO2 100%   BMI 32.96 kg/m   CV: regular , S1/S2 Resp: clear to auscultation bilaterally, normal RR and effort noted GI: soft, no tenderness, with active bowel sounds.   Assessment: Encounter Diagnosis  Name Primary?   Positive FIT (fecal immunochemical test) Yes     Plan: Colonoscopy   The benefits and risks of the planned procedure(s) were described in detail with the patient or (when appropriate) their health care proxy.  Risks were outlined as including, but not limited to, bleeding, infection, perforation, adverse medication reaction  leading to cardiac or pulmonary decompensation, pancreatitis (if ERCP).  The limitation of incomplete mucosal visualization was also discussed.  No guarantees or warranties were given.  The patient was provided an opportunity to ask questions and all were answered. The patient agreed with the plan.   The patient is appropriate for an endoscopic procedure in the ambulatory setting.   - Victory Brand, MD

## 2024-09-13 NOTE — Op Note (Signed)
 [facility] Patient Name: Oscar [name] Procedure Date: 09/13/2024 8:07 AM MRN: [id number] Endoscopist: Victory L. Legrand , MD, [id number] Age: 78 Referring MD:  Date of Birth: 18-Nov-1945 Gender: Male Account #: [id number] Procedure:                Colonoscopy Indications:              Positive fecal immunochemical test, Rectal bleeding                            (self-limited)                           Clinical details in recent office note                           Patient also had a subcentimeter adenomatous polyp                            in October 2019 Medicines:                Monitored Anesthesia Care Procedure:                Pre-Anesthesia Assessment:                           - Prior to the procedure, a History and Physical                            was performed, and patient medications and                            allergies were reviewed. The patient's tolerance of                            previous anesthesia was also reviewed. The risks                            and benefits of the procedure and the sedation                            options and risks were discussed with the patient.                            All questions were answered, and informed consent                            was obtained. Prior Anticoagulants: The patient has                            taken Eliquis  (apixaban ), last dose was 2 days                            prior to procedure. ASA Grade Assessment: III - A  patient with severe systemic disease. After                            reviewing the risks and benefits, the patient was                            deemed in satisfactory condition to undergo the                            procedure.                           After obtaining informed consent, the colonoscope                            was passed under direct vision. Throughout the                            procedure, the patient's blood pressure,  pulse, and                            oxygen saturations were monitored continuously. The                            CF [id number] #[id number] was introduced through the anus                            and advanced to the the terminal ileum, with                            identification of the appendiceal orifice and IC                            valve. The colonoscopy was somewhat difficult due                            to a redundant colon. Successful completion of the                            procedure was aided by using manual pressure and                            straightening and shortening the scope to obtain                            bowel loop reduction. The patient tolerated the                            procedure well. The quality of the bowel                            preparation was excellent. The terminal ileum,  ileocecal valve, appendiceal orifice, and rectum                            were photographed. The bowel preparation used was                            SUPREP. Scope In: 8:17:50 AM Scope Out: 8:44:32 AM Scope Withdrawal Time: 0 hours 19 minutes 14 seconds  Total Procedure Duration: 0 hours 26 minutes 42 seconds  Findings:                 The perianal and digital rectal examinations were                            normal.                           Repeat examination of right colon under NBI                            performed.                           Two sessile polyps were found in the transverse                            colon. The polyps were 3 to 6 mm in size. These                            polyps were removed with a cold snare. Resection                            and retrieval were complete.                           A 12-14 mm polyp was found in the transverse colon.                            The polyp was sessile. The polyp was removed with a                            hot snare. Resection and retrieval were complete.                             (Removed en bloc, then With cold snare for                            retrieval)                           Multiple diverticula were found in the left colon.                           Internal hemorrhoids were found.  The exam was otherwise without abnormality on                            direct and retroflexion views. Complications:            No immediate complications. Estimated Blood Loss:     Estimated blood loss was minimal. Impression:               - Two 3 to 6 mm polyps in the transverse colon,                            removed with a cold snare. Resected and retrieved.                           - One 12-14 mm polyp in the transverse colon,                            removed with a hot snare. Resected and retrieved.                           - Diverticulosis in the left colon.                           - Internal hemorrhoids.                           - The examination was otherwise normal on direct                            and retroflexion views.                           Patient had self-limited hemorrhoidal bleeding that                            also likely explains positive FIT testing done                            shortly afterward. Recommendation:           - Patient has a contact number available for                            emergencies. The signs and symptoms of potential                            delayed complications were discussed with the                            patient. Return to normal activities tomorrow.                            Written discharge instructions were provided to the                            patient.                           -  Resume previous diet.                           - Resume Eliquis  (apixaban ) at prior dose tomorrow.                           - Await pathology results.                           - No repeat routine surveillance colonoscopy                            recommended  due to age and current guidelines. No                            further routine FIT testing recommended. Ainsley Sanguinetti L. Legrand, MD 09/13/2024 8:54:19 AM This report has been signed electronically.

## 2024-09-13 NOTE — Patient Instructions (Signed)
 Resume previous diet  Resume Eliquis  at prior dose tomorrow  Await pathology results  See handouts on polyps, hemorrhoids and diverticulosis  YOU HAD AN ENDOSCOPIC PROCEDURE TODAY AT THE [facility]:   Refer to the procedure report that was given to you for any specific questions about what was found during the examination.  If the procedure report does not answer your questions, please call your gastroenterologist to clarify.  If you requested that your care partner not be given the details of your procedure findings, then the procedure report has been included in a sealed envelope for you to review at your convenience later.  YOU SHOULD EXPECT: Some feelings of bloating in the abdomen. Passage of more gas than usual.  Walking can help get rid of the air that was put into your GI tract during the procedure and reduce the bloating. If you had a lower endoscopy (such as a colonoscopy or flexible sigmoidoscopy) you may notice spotting of blood in your stool or on the toilet paper. If you underwent a bowel prep for your procedure, you may not have a normal bowel movement for a few days.  Please Note:  You might notice some irritation and congestion in your nose or some drainage.  This is from the oxygen used during your procedure.  There is no need for concern and it should clear up in a day or so.  SYMPTOMS TO REPORT IMMEDIATELY: Following lower endoscopy (colonoscopy or flexible sigmoidoscopy):  Excessive amounts of blood in the stool  Significant tenderness or worsening of abdominal pains  Swelling of the abdomen that is new, acute  Fever of 100F or higher  For urgent or emergent issues, a gastroenterologist can be reached at any hour by calling [phone]. Do not use MyChart messaging for urgent concerns.   DIET:  We do recommend a small meal at first, but then you may proceed to your regular diet.  Drink plenty of fluids but you should avoid alcoholic beverages for 24  hours.  ACTIVITY:  You should plan to take it easy for the rest of today and you should NOT DRIVE or use heavy machinery until tomorrow (because of the sedation medicines used during the test).    FOLLOW UP: Our staff will call the number listed on your records the next business day following your procedure.  We will call around 7:15- 8:00 am to check on you and address any questions or concerns that you may have regarding the information given to you following your procedure. If we do not reach you, we will leave a message.     If any biopsies were taken you will be contacted by phone or by letter within the next 1-3 weeks.  Please call us  at [phone] if you have not heard about the biopsies in 3 weeks.   SIGNATURES/CONFIDENTIALITY: You and/or your care partner have signed paperwork which will be entered into your electronic medical record.  These signatures attest to the fact that that the information above on your After Visit Summary has been reviewed and is understood.  Full responsibility of the confidentiality of this discharge information lies with you and/or your care-partner.

## 2024-09-14 ENCOUNTER — Telehealth: Payer: Self-pay

## 2024-09-14 NOTE — Telephone Encounter (Signed)
 Unable to reach after follow up call. Voice mail full.

## 2024-09-15 LAB — SURGICAL PATHOLOGY

## 2024-09-17 NOTE — Therapy (Signed)
 OUTPATIENT PHYSICAL THERAPY TREATMENT NOTE/DISCHARGE   Patient Name: Oscar [name] MRN: [id number] DOB:07/27/1946, 78 y.o., male Today's Date: 09/20/2024 PHYSICAL THERAPY DISCHARGE SUMMARY  Visits from Start of Care: 4  Current functional level related to goals / functional outcomes: Goals partially met   Remaining deficits: Mild pain   Education / Equipment: HEP   Patient agrees to discharge. Patient goals were partially met. Patient is being discharged due to being pleased with the current functional level.  END OF SESSION:  PT End of Session - 09/20/24 1405     Visit Number 4    Number of Visits 7    Date for Recertification  09/27/24    Authorization Type [employer] [facility]    Authorization Time Period 10 visits approved 08/16/24-11/15/23    Authorization - Visit Number 4    Authorization - Number of Visits 10    PT Start Time 1400    PT Stop Time 1440    PT Time Calculation (min) 40 min    Activity Tolerance Patient tolerated treatment well    Behavior During Therapy WFL for tasks assessed/performed          Past Medical History:  Diagnosis Date   Anesthesia complication    PER PT, HARD TO WAKE' UP PAST SHOULDER SURGERY IN 2016   Arthritis    lumbar, spondylosis, DDD,stenosis    Atrial premature depolarization    Chronic right hip pain    and right leg pain   Coronary artery disease    Diabetes mellitus without complication (HCC)    Dysrhythmia    pt. reports that he has been told in the past that he has  irreg. heattbeat    Edema    Gout    bil ankles   Hyperlipidemia    Hypertension    Rotator cuff tear    right   Shortness of breath    Swelling of extremity    bil feet   Type 2 diabetes mellitus with hyperglycemia (HCC)    Past Surgical History:  Procedure Laterality Date   BACK SURGERY     January 2014   CARDIAC CATHETERIZATION     2004, outcome good, to be followed for/with medical therapy    CORONARY ARTERY BYPASS GRAFT N/A 07/16/2021    Procedure: CORONARY ARTERY BYPASS GRAFTING (CABG) X FIVE, USING LEFT INTERNAL MAMMARY ARTERY AND RIGHT LEG GREATER SAPHENOUS VEIN HARVESTED ENDOSCOPICALLY;  Surgeon: Kerrin Elspeth BROCKS, MD;  Location: [facility] OR;  Service: Open Heart Surgery;  Laterality: N/A;   ELBOW BURSA SURGERY     removed bursacs both elbows   ENDOVEIN HARVEST OF GREATER SAPHENOUS VEIN  07/16/2021   Procedure: ENDOVEIN HARVEST OF GREATER SAPHENOUS VEIN;  Surgeon: Kerrin Elspeth BROCKS, MD;  Location: [facility] OR;  Service: Open Heart Surgery;;   FASCIECTOMY Left 12/12/2020   Procedure: FASCIECTOMY LEFT SMALL FINGER;  Surgeon: Murrell Kuba, MD;  Location: [facility];  Service: Orthopedics;  Laterality: Left;   LEFT HEART CATH AND CORONARY ANGIOGRAPHY N/A 07/12/2021   Procedure: LEFT HEART CATH AND CORONARY ANGIOGRAPHY;  Surgeon: Verlin Lonni BIRCH, MD;  Location: [facility];  Service: Cardiovascular;  Laterality: N/A;   SHOULDER ARTHROSCOPY WITH ROTATOR CUFF REPAIR AND SUBACROMIAL DECOMPRESSION Right 08/21/2015   Procedure: SHOULDER ARTHROSCOPY WITH ROTATOR CUFF REPAIR S UBACROMIAL DECOMPRESSION, ;  Surgeon: Eva Herring, MD;  Location: [facility];  Service: Orthopedics;  Laterality: Right;  Right shoulder arthroscopy rotator cuff repair vs debridement, subacromial decompression, possible  biceps tenotomy.   TEE WITHOUT CARDIOVERSION N/A 07/16/2021   Procedure: TRANSESOPHAGEAL ECHOCARDIOGRAM (TEE);  Surgeon: Kerrin Elspeth BROCKS, MD;  Location: [facility] OR;  Service: Open Heart Surgery;  Laterality: N/A;   Patient Active Problem List   Diagnosis Date Noted   Positive FIT (fecal immunochemical test) 09/01/2024   S/P CABG x 5    Unstable angina (HCC) 07/12/2021   Coronary artery disease involving native coronary artery of native heart with unstable angina pectoris (HCC)    Essential hypertension 01/02/2021   Dyslipidemia, goal LDL below 70 01/02/2021   Obesity (BMI 30-39.9) 01/02/2021   Palpitations  01/02/2021   Cold extremities 03/02/2018   Pain of lower extremity 03/02/2018   Decreased pedal pulses 03/02/2018   Dyspnea 06/28/2016   Abnormal EKG 06/28/2016   Premature atrial contractions 06/28/2016   Family history of heart disease 06/28/2016   CAD in native artery 06/28/2016    PCP: Valentin Skates, DO  REFERRING PROVIDER: Tressia Duncans, MD  REFERRING DIAG: Spondylolisthesis, lumbar region [M43.16]   Rationale for Evaluation and Treatment: Rehabilitation  THERAPY DIAG:  Other low back pain  Unspecified soft tissue disorder related to use, overuse and pressure multiple sites  Muscle weakness (generalized)  PERTINENT HISTORY: CAD, unstable angina, s/p, CABG x5, HTN  WEIGHT BEARING RESTRICTIONS: No  FALLS:  Has patient fallen in last 6 months? No  LIVING ENVIRONMENT: Lives with: lives with their family Lives in: House/apartment Stairs: Yes: Internal: 12 steps; can reach both Has following equipment at home: None  OCCUPATION: Driver   PRECAUTIONS: None ---------------------------------------------------------------------------------------------  SUBJECTIVE:                                                                                                                                                                                           SUBJECTIVE STATEMENT:  Continued improvement, rates himself at 65%.  Feels he can continue independently with a HEP  Eval statement 08/16/2024: no MOI, pt started noting back pain R>L. Is worried about the potential of a tumor on the inside of the body, feels like extra weight. Had back surgery on L3/L4 in 2014. Had an xray at [facility], indicated that previous surgery was intact. Pain started 6 months ago sporadically, constant pain, notices the pain most when going to stand up after sitting for a long time.  Pain wakes him up at night, especially when laying on L side, feels like something is shifting.  Pain currently  0/10 gets up to 4/10 No swelling, no abnormal n//t compared to PLOF    RED FLAGS: Bowel or bladder incontinence: No and Spinal tumors: No    PLOF: Independent  PATIENT GOALS: help pain  NEXT MD VISIT: need to schedule ---------------------------------------------------------------------------------------------  OBJECTIVE:  Note: Objective measures were completed at Evaluation unless otherwise noted.  DIAGNOSTIC FINDINGS:  No recent pertinent imaging in pt chart, pt reports recent xrays with [facility]  PATIENT SURVEYS:  MODI:12/50 (24%); 09/20/24 7/50  COGNITION: Overall cognitive status: Within functional limits for tasks assessed   PALPATION: Tenderness to R lumbar paraspinals  Lumbar contraction pattern  L Multifidus:  R Multifidus:    SENSATION: WFL  MUSCLE LENGTH: Hamstrings: Right 20; deg; Left 20 deg   POSTURE: flexed trunk    LUMBAR ROM:   AROM eval  Flexion 80%  Extension 70%  Right lateral flexion 70%  Left lateral flexion 70%  Right rotation 60%  Left rotation 60%   (Blank rows = not tested)  ! Indicates pain with testing  LOWER EXTREMITY ROM:     Active  Right eval Left eval  Hip flexion    Hip extension    Hip abduction    Hip adduction    Hip internal rotation    Hip external rotation    Knee flexion    Knee extension    Ankle dorsiflexion    Ankle plantarflexion    Ankle inversion    Ankle eversion     (Blank rows = not tested)  ! Indicates pain with testing  LOWER EXTREMITY MMT:    MMT Right eval Left eval  Hip flexion 4 4  Hip extension 4+ 5  Hip abduction 4 4  Hip adduction    Hip internal rotation    Hip external rotation    Knee flexion    Knee extension    Ankle dorsiflexion    Ankle plantarflexion    Ankle inversion    Ankle eversion     (Blank rows = not tested)   ! Indicates pain with testing LUMBAR SPECIAL TESTS:  Prone instability test: Positive GAIT: Distance walked: 100' Assistive device  utilized: None Level of assistance: Complete Independence Comments: flexed trunk   [facility] Adult PT Treatment:                                                DATE: 09/20/24 Therapeutic Exercise: Nustep L4 8 min  Therapeutic Activity: ODI retake Supine QL stretch 30s B HEP review and update  [facility] Adult PT Treatment:                                                DATE: 09/06/24 Therapeutic Exercise: Nustep L4 8 min Neuromuscular re-ed: Supine hip fallouts RTB 15x B, 15/15 S/L clams RTB 15/15 P-ball curl ups 15x B, 15/15 Bridge against RTB 15x Therapeutic Activity: Seated hamstring stretch 30s x2 B Supine QL stretch 30s x2 B  [facility] Adult PT Treatment:                                                DATE: 08/30/24 Therapeutic Exercise: Nustep level 5 x 5 mins while gathering subjective info Seated hamstring stretch 2x30 BIL SLR x10 BIL Sidelying clamshells or hip abduction with  for slight ext bias 2x10 Neuromuscular re-ed: Seated pball press downs 5 hold 2x10 Supine 90/90 isometric hold 2x30 Supine 90/90 with alternating heel taps 2x30 Bridges 5 hold 2x10 Therapeutic Activity: STS arms crossed x10   [facility] Adult PT Treatment:                                                DATE: 08/16/2024  Self Care: Pt education POC discussion                                                                                                                                PATIENT EDUCATION:  Education details: Pt received education regarding HEP performance, ADL performance, functional activity tolerance, impairment education, appropriate performance of therapeutic activities.  Person educated: Patient Education method: Explanation, Demonstration, Tactile cues, Verbal cues, and Handouts Education comprehension: verbalized understanding and returned demonstration  HOME EXERCISE PROGRAM: Access Code: [email or web address] URL: [email or web address] Date: 09/20/2024 Prepared by: Milan Perkins  Exercises - Supine Quadratus Lumborum Stretch  - 1 x daily - 3 x weekly - 1 sets - 2 reps - 30s hold - Supine 90/90 Alternating Toe Touch  - 1 x daily - 3 x weekly - 1 sets - 10-30 reps - 2s hold - Curl Up with Arms Crossed  - 1 x daily - 3 x weekly - 1-2 sets - 15 reps - 30s hold - Modified Thomas Stretch  - 1 x daily - 3 x weekly - 1 sets - 2 reps - 30s hold - Seated Table Hamstring Stretch  - 1 x daily - 3 x weekly - 1 sets - 2 reps - 30s hold - Sit to Stand Without Arm Support  - 1 x daily - 3 x weekly - 2 sets - 10 reps ---------------------------------------------------------------------------------------------  ASSESSMENT:  CLINICAL IMPRESSION: patient confident to DC PT at this time.  He has been compliant with HEP and notes continued progress regarding pain and function.  Eval impression (08/16/2024): Pt. attended today's physical therapy session for evaluation of LBP. Pt has complaints of 4/10 LBP that worsens after prolonged posturing. Pt is worried about systemic growth in the low back d/t a feeling of heaviness and shifting when laying on L side. Pt has no red flags or systemic presentation at eval aside from slight discomfort at night. Pt has notable deficits and would benefit from therapeutic focus on lumbar stability, marked ROM deficits, proximal hip strength, dynamic core activation patterns, and myofascial limitations throughout  thoracolumbar region.  Treatment performed today focused on pt education detailed in the objective. Pt demonstrated good understanding of education provided. required minimal v/t cues and no assistance for appropriate performance with today's activities. Pt requires the intervention of skilled outpatient physical therapy to address the aforementioned deficits and progress towards a functional level in  line with therapeutic goals.    OBJECTIVE IMPAIRMENTS: Abnormal gait, decreased activity tolerance, decreased mobility, difficulty walking,  decreased ROM, decreased strength, hypomobility, increased fascial restrictions, improper body mechanics, postural dysfunction, and pain.   ACTIVITY LIMITATIONS: carrying, lifting, sitting, standing, squatting, locomotion level, and caring for others  PARTICIPATION LIMITATIONS: meal prep, cleaning, driving, shopping, community activity, and occupation  PERSONAL FACTORS: Age, Fitness, Past/current experiences, Profession, and Time since onset of injury/illness/exacerbation are also affecting patient's functional outcome.   REHAB POTENTIAL: Good  CLINICAL DECISION MAKING: Stable/uncomplicated  EVALUATION COMPLEXITY: Low   GOALS: Goals reviewed with patient? YES  SHORT TERM GOALS: Target date: 09/06/2024  Pt will be independent with administered HEP to demonstrate the competency necessary for long term managemnet of symptoms at home. Baseline: Goal status: Met   LONG TERM GOALS: Target date: 09/27/2024  Pt. Will achieve a MODI score of 6/50 (12%) as to demonstrate improvement in self-perceived functional ability with daily activities.  Baseline: 12/50 (24%); 09/20/24 7/50  Goal status: Met  2.  Pt will improve Global hip strength to a 4+/5 to demonstrate improvement in strength for quality of motion and activity performance.  Baseline:  Goal status: Partially met  3.  Pt will improve Lumbar AROM to 90% of standardized norms with less than 2/10 pain to demonstrate necessary mobility for high quality and safe ADLs  Baseline:  Goal status: Partially met  4.  Pt will report the ability to stand for >/= 20 minutes as to demonstrate improved tolerance to standing for prolonged time and improved ability to participate in ADLs.  Baseline: 5 Goal status: Partially met  ---------------------------------------------------------------------------------------------  PLAN:  PT FREQUENCY: 1-2x/week  PT DURATION: 6 weeks  PLANNED INTERVENTIONS: 97110-Therapeutic exercises, 97530-  Therapeutic activity, [id number]- Neuromuscular re-education, 97535- Self Care, [id number]- Manual therapy, [phone]- Gait training, [phone]- Aquatic Therapy, Patient/Family education, Balance training, Stair training, Taping, Joint mobilization, Spinal mobilization, and Moist heat.  PLAN FOR NEXT SESSION: Review HEP, Begin POC as detailed in assessment   Reyes CHRISTELLA Kohut PT  09/20/2024, 2:15 PM

## 2024-09-19 ENCOUNTER — Other Ambulatory Visit: Payer: Self-pay | Admitting: Internal Medicine

## 2024-09-19 ENCOUNTER — Ambulatory Visit: Payer: Self-pay | Admitting: Gastroenterology

## 2024-09-20 ENCOUNTER — Ambulatory Visit

## 2024-09-20 DIAGNOSIS — M7099 Unspecified soft tissue disorder related to use, overuse and pressure multiple sites: Secondary | ICD-10-CM

## 2024-09-20 DIAGNOSIS — M6281 Muscle weakness (generalized): Secondary | ICD-10-CM

## 2024-09-20 DIAGNOSIS — M5459 Other low back pain: Secondary | ICD-10-CM | POA: Diagnosis not present

## 2024-09-22 ENCOUNTER — Encounter (HOSPITAL_BASED_OUTPATIENT_CLINIC_OR_DEPARTMENT_OTHER): Payer: Self-pay | Admitting: Internal Medicine

## 2024-09-22 ENCOUNTER — Ambulatory Visit (HOSPITAL_BASED_OUTPATIENT_CLINIC_OR_DEPARTMENT_OTHER): Admitting: Internal Medicine

## 2024-09-22 VITALS — BP 100/66 | HR 74 | Ht 72.0 in | Wt 243.5 lb

## 2024-09-22 DIAGNOSIS — E785 Hyperlipidemia, unspecified: Secondary | ICD-10-CM

## 2024-09-22 DIAGNOSIS — I2510 Atherosclerotic heart disease of native coronary artery without angina pectoris: Secondary | ICD-10-CM

## 2024-09-22 DIAGNOSIS — I480 Paroxysmal atrial fibrillation: Secondary | ICD-10-CM | POA: Diagnosis not present

## 2024-09-22 DIAGNOSIS — I10 Essential (primary) hypertension: Secondary | ICD-10-CM

## 2024-09-22 DIAGNOSIS — Z951 Presence of aortocoronary bypass graft: Secondary | ICD-10-CM

## 2024-09-22 DIAGNOSIS — E782 Mixed hyperlipidemia: Secondary | ICD-10-CM

## 2024-09-22 DIAGNOSIS — N1832 Chronic kidney disease, stage 3b: Secondary | ICD-10-CM

## 2024-09-22 NOTE — Patient Instructions (Addendum)
 Medication Instructions:   Your physician recommends that you continue on your current medications as directed. Please refer to the Current Medication list given to you today.  *If you need a refill on your cardiac medications before your next appointment, please call your pharmacy*    Follow-Up:   Your next appointment:   8 month(s)  Provider:   WITH DR. HILTY AT [facility] LOC[facility]

## 2024-09-22 NOTE — Progress Notes (Unsigned)
 OFFICE NOTE  Chief Complaint:  Follow-up lab work  Primary Care Physician: Valentin Skates, DO  HPI:  Oscar [name] is a 78 y.o. male who is a history of moderate obesity, dyslipidemia, hypertension and type 2 diabetes. There is a strong family history of heart disease with his father who died of a massive heart attack at age 88 and a brother who died of an MI at age 12. Recently he's noted some increase in palpitations. He saw his primary care provider who did EKGs and noted he was having PACs. He was started on metoprolol  tartrate 25 mg twice a day. He reports marked improvement in his symptoms over his EKG still shows PACs. Heart rate however is lower and blood pressure which was in the 140s and 150 systolic is now 122/76 in the office today. Mr. Switzer denies any chest pain but notes that he does get short of breath and sweats very easily with minimal exertion. Although his overweight his weight is been elevated for some time and not necessarily increasing to explain the new change in his symptoms. He's felt more fatigued recently as well. Of note he previously seen [facility] in the early 2000's. He underwent heart catheterization 2002, which showed normal LV systolic function and three-vessel nonobstructive coronary disease. He was noted to have a 30% ostial LAD stenosis, a 50% first diagonal branch stenosis, and apical 70% LAD stenosis. The left circumflex had a 40% stenosis in the mid vessel and diffuse 20% distal stenosis. Right coronary artery was codominant and noted to have 50% diffuse stenosis in the mid vessel and a tubular 60% distal stenosis. Medical therapy was recommended. EKG today shows sinus rhythm with PACs and left anterior fascicular block. There was voltage criteria for LVH and lateral T-wave inversions concerning for ischemia.  07/24/2016  Mr. Cislo returns today for follow-up of his studies. He underwent nuclear stress testing and ultimately had an echocardiogram due to  abnormal nuclear findings. Nuclear stress test showed no reversible ischemia and a fixed basal inferior defect concerning for probable artifact. LVEF was decreased at 42% however the nuclear readers suggested that the EF appeared to be normal. Subsequent echocardiography showed normal LV function without regional wall motion abnormalities and mild diastolic dysfunction. I shared those results of Mr. Kellogg today and feel that they're fairly reassuring. He's not describing chest pain but does get short of breath and sweats easily with minimal exertion. This could be weight related. He does note improvement with metoprolol  with regards to his PACs. He says he no longer has any palpitations. He is on optimal medical therapy for diabetes, cholesterol and is on daily aspirin .  01/21/2017  Mr. Ruta was seen today in follow-up. He denies any worsening shortness of breath or chest pain. His EKG is unchanged showing persistent T-wave inversions. Echo which showed mild diastolic dysfunction with normal LVEF. I've encouraged him to work on exercise and weight loss however there is not been a lot of that. He recently played golf the other weekend said that he was able to walk around without any difficulty but then had to walk several blocks to go to dinner and became a little short of breath. He reports his blood sugars been well controlled with hemoglobin A1c is 6. Cholesterol is at goal as well. He takes only half tablet of Lasix  for some intermittent swelling of his legs mostly related to venous hypertension.  03/02/2018  Mr. Knoch returns today for follow-up.  Overall he is done  well.  He denies any worsening chest pain or shortness of breath.  He has been playing some golf.  Recently has been having problems with his back.  He is followed by Dr. Nudelman.  He has had some leg pain which is unclear if it is related to his back or peripheral arterial disease.  He reports cool feet and has answered yes to some notable  lower extremity hair loss.  He has a history of hypertension and dyslipidemia as described.  His EKG actually looks somewhat better today, but does show sinus rhythm with PACs, LVH and nonspecific T wave changes laterally.  Lab work is pending from his PCP, however he said his hemoglobin A1c was 6.3 on metformin .  05/30/2020  Mr. Cregg seen today in follow-up.  Is been 2 years since I last saw him.  He has followed up with his PCP back in June.  The time his A1c was 7.9.  Cholesterol was assessed in January.  Total was 172, HDL 37, triglycerides 115 and LDL 112.  His target LDL should probably be less than 70 given diabetes and known CAD by cath in 2002.  He is on pravastatin.  We discussed the possibility of adding ezetimibe or more aggressive dietary invention and weight loss.  It does appear that he is down a few pounds since his visit in June.  He has some difficulty exercising due to back issues.  He recently was started on vitamin B12 with some improvement in what is thought to be peripheral neuropathy as he is described cool feet but had essentially normal lower extremity arterial Dopplers in 2019.  08/21/2020  Mr. Deery seen today for follow-up.  He has reported some progressive shortness of breath and fatigue. He occasionally gets tingling in his left arm and left anterior chest wall, sometimes worse with exertion.  Blood pressure was elevated today however at 161/84.  He reports home blood pressure readings are better.  EKG today shows a sinus rhythm with some PACs.  He denies any palpitations or arrhythmias.  He had cholesterol testing in January 2021 showed total cholesterol 172, HDL 37, LDL 112 and triglycerides 884.  Hemoglobin A1c as of June was 7.9.  He is working with his PCP on his blood sugars.  LDL still remains above target.  He has ongoing complaints of cool feet which feel warm but are subjectively cool and has had diagnosis of neuropathy.  He also had lower extremity arterial Dopplers  in the past which were normal.  10/09/2020  Mr. Girouard returns today for follow-up.  He underwent stress testing on November 16.  There was a small inferior attenuation artifact which improved with upright imaging.  This is more consistent with bowel shadowing.  No ischemia was noted.  EF was at 49% however an echo in the past that showed a low normal EF around 50%.  He really denies any shortness of breath.  He had been complaining of left arm symptoms really concerning for a radiculopathy.  He saw Dr.Kuzma who did nerve conduction testing and from what he explained to me it sounds like he may have left ulnar nerve entrapment at the elbow and may need surgery.  From a cardiac standpoint he is at acceptable risk for this.  06/22/2021  Mr. Dishman is seen today as an urgent follow-up.  He called in the office with concerns about worsening shortness of breath and chest pressure.  This is developed over the past week or so.  He  did have COVID-19 I think in July.  He said he did recover fully from that but the symptoms developed afterwards.  He has had chest discomfort symptoms before.  He had some known mild coronary disease by catheter remotely in 2002 but did have a family history of early onset heart disease and has numerous cardiovascular risk factors including obesity, diabetes, dyslipidemia and hypertension.  He denies any particular chest pain.  EKG today actually shows a normal sinus rhythm with left anterior fascicular block at 67.  Blood pressure is excellent 118/72.  Recent lab work included a lipid profile showing total cholesterol 173, HDL 41, LDL 113 and triglycerides 97.  A1c of 6.2, liver enzymes were normal.  07/05/2021  Mr. Verdejo returns for follow-up.  He was seen just a few weeks ago for progressive dyspnea on exertion and chest pressure.  This had presented and worsened over the past week.  He thought it was related initially to COVID-19 but he did recover somewhat from the symptoms.  I referred  him for coronary CT angiogram which was performed on 06/28/2021.  Image quality was felt to be suboptimal however the calcium  score was significantly elevated at 2381.  97th percentile for age and sex matched controls.  Coronary arteries were thought to be diffusely diseased with severe stenosis in the right coronary artery, LAD and circumflex arteries-CAD RADS 4B.  Based on these findings, cardiac catheterization was recommended.  I reviewed the images and I agree with this recommendation.  I discussed this with the patient and his wife today as well as his daughter via teleconference.  We did discuss the procedure which will likely be radial catheterization at length including the risk, benefits and alternatives today and he is agreeable to proceed.  08/17/2021  Mr. Reamy returns today for follow-up.  He underwent fairly urgent cardiac catheterization for symptoms concerning for unstable angina.  He had a coronary CT angiogram which showed significant calcium  over 2000 and probable severe stenosis in the right coronary artery LAD and circumflex arteries.  Cath did demonstrate severe three-vessel coronary disease.  He was referred for coronary artery bypass grafting.  He underwent CABG x5 on 07/16/2021 with LIMA to LAD, SVG to diagonal, SVG to PDA and sequential SVG to OM 2 and OM 3.  Overall he tolerated this well however developed atrial fibrillation perioperatively.  He was treated with amiodarone  and developed atrial flutter for which he had rapid atrial pacing but did not convert him.  He then went in and out of atrial fibrillation but ultimately converted back to a sinus rhythm.  He was recently seen in follow-up and his amiodarone  was decreased further to 200 mg daily.  He also was maintained on digoxin .  Weight has been pretty stable at around 247 pounds at home.  He reports some fatigue but is generally improving.  11/20/2022  Mr. Moffatt is seen today in follow-up.  He seems to be doing well without any  recurrent A-fib.  He has continued concerns about discomfort at his sternotomy scar site including tenderness and intermittent shooting pains.  He denies any chest pain.  Blood pressure was low normal today but he has no dizziness or orthostatic symptoms.  11/27/2023  Mr. Koller returns today for follow-up.  He has done well over the past year.  Again his has come concerns about his sternotomy scar which is not totally healed.  He gets tenderness and pain primarily related to the keloid.  We again discussed possible management options  including steroid injection and Silastic gels however he said he was not too interested in that.  He denies any anginal symptoms.  His blood pressure is well-controlled today.  He had lipids about a year ago which were well-controlled with an LDL of 63 and he is due for repeat labs next month.  He will follow-up with his primary care provider.  Overall he seems to be doing well.  He is concerned a little about some weight gain.  He says that he has been less active and attributes most of it to that.  09/23/2024  Mr. Comer is seen today in follow-up.  He had recent lab work for preprocedure workup of positive Hemoccult.  He did undergo colonoscopy and was found to have some hemorrhoids which were likely the cause of bleeding.  He subsequently was restarted on Eliquis .  The repeat labs did show an increase in creatinine to 1.81 up from 1.48 however that 1.48 value was 3 years ago.  I was able to review scanned labs from his PCP which indicate that his creatinine right at about after he had heart surgery in 2022 did go up to about 1.8-2.0.  His creatinine has remained around that level for the past 2 years.  Based on that he has stage IIIb chronic kidney disease.  He seems to be asymptomatic.  He is on some furosemide  but has generally had issues with edema when he is not on that.  He also takes Jardiance and losartan  which could either be contributing to or potentially benefiting his  kidney function.  PMHx:  Past Medical History:  Diagnosis Date   Anesthesia complication    PER PT, HARD TO WAKE' UP PAST SHOULDER SURGERY IN 2016   Arthritis    lumbar, spondylosis, DDD,stenosis    Atrial premature depolarization    Chronic right hip pain    and right leg pain   Coronary artery disease    Diabetes mellitus without complication (HCC)    Dysrhythmia    pt. reports that he has been told in the past that he has  irreg. heattbeat    Edema    Gout    bil ankles   Hyperlipidemia    Hypertension    Rotator cuff tear    right   Shortness of breath    Swelling of extremity    bil feet   Type 2 diabetes mellitus with hyperglycemia [facility])     Past Surgical History:  Procedure Laterality Date   BACK SURGERY     January 2014   CARDIAC CATHETERIZATION     2004, outcome good, to be followed for/with medical therapy    CORONARY ARTERY BYPASS GRAFT N/A 07/16/2021   Procedure: CORONARY ARTERY BYPASS GRAFTING (CABG) X FIVE, USING LEFT INTERNAL MAMMARY ARTERY AND RIGHT LEG GREATER SAPHENOUS VEIN HARVESTED ENDOSCOPICALLY;  Surgeon: Kerrin Elspeth BROCKS, MD;  Location: [facility] OR;  Service: Open Heart Surgery;  Laterality: N/A;   ELBOW BURSA SURGERY     removed bursacs both elbows   ENDOVEIN HARVEST OF GREATER SAPHENOUS VEIN  07/16/2021   Procedure: ENDOVEIN HARVEST OF GREATER SAPHENOUS VEIN;  Surgeon: Kerrin Elspeth BROCKS, MD;  Location: [facility];  Service: Open Heart Surgery;;   FASCIECTOMY Left 12/12/2020   Procedure: FASCIECTOMY LEFT SMALL FINGER;  Surgeon: Murrell Kuba, MD;  Location: [facility];  Service: Orthopedics;  Laterality: Left;   LEFT HEART CATH AND CORONARY ANGIOGRAPHY N/A 07/12/2021   Procedure: LEFT HEART CATH AND CORONARY ANGIOGRAPHY;  Surgeon: Verlin Lonni BIRCH, MD;  Location: [facility] INVASIVE CV LAB;  Service: Cardiovascular;  Laterality: N/A;   SHOULDER ARTHROSCOPY WITH ROTATOR CUFF REPAIR AND SUBACROMIAL DECOMPRESSION Right 08/21/2015   Procedure:  SHOULDER ARTHROSCOPY WITH ROTATOR CUFF REPAIR S UBACROMIAL DECOMPRESSION, ;  Surgeon: Eva Herring, MD;  Location: [facility];  Service: Orthopedics;  Laterality: Right;  Right shoulder arthroscopy rotator cuff repair vs debridement, subacromial decompression, possible biceps tenotomy.   TEE WITHOUT CARDIOVERSION N/A 07/16/2021   Procedure: TRANSESOPHAGEAL ECHOCARDIOGRAM (TEE);  Surgeon: Kerrin Elspeth BROCKS, MD;  Location[facility];  Service: Open Heart Surgery;  Laterality: N/A;    FAMHx:  Family History  Problem Relation Age of Onset   Heart disease Father    Diabetes Father    Heart disease Brother    Diabetes Paternal Grandmother    Colon cancer Neg Hx    Esophageal cancer Neg Hx    Rectal cancer Neg Hx    Stomach cancer Neg Hx     SOCHx:   reports that he quit smoking about 50 years ago. His smoking use included cigarettes. He started smoking about 60 years ago. He has a 3 pack-year smoking history. He has never used smokeless tobacco. He reports that he does not currently use alcohol. He reports that he does not use drugs.  ALLERGIES:  Allergies  Allergen Reactions   Yellow Fever Vaccine Hives    ROS: Pertinent items noted in HPI and remainder of comprehensive ROS otherwise negative.  HOME MEDS: Current Outpatient Medications  Medication Sig Dispense Refill   apixaban  (ELIQUIS ) 5 MG TABS tablet TAKE ONE TABLET BY MOUTH TWICE DAILY 180 tablet 1   atorvastatin  (LIPITOR ) 80 MG tablet TAKE ONE TABLET BY MOUTH ONE TIME DAILY 90 tablet 3   ferrous sulfate 325 (65 FE) MG tablet 1 tablet Orally Three times a Week; Duration: 30 day(s)     furosemide  (LASIX ) 20 MG tablet TAKE ONE TABLET BY MOUTH ONE TIME DAILY 90 tablet 3   halobetasol (ULTRAVATE) 0.05 % cream Apply 1 application topically 2 (two) times daily as needed (psoriasis).     hydrocortisone  2.5 % cream Apply topically 2 (two) times daily. 30 g 4   JARDIANCE 25 MG TABS tablet      losartan  (COZAAR ) 50 MG  tablet Take by mouth.     Magnesium  250 MG TABS Take 250 mg by mouth in the morning.     metFORMIN  (GLUCOPHAGE ) 500 MG tablet Take 500-1,000 mg by mouth See admin instructions. Take 1 tablet (500 mg) by mouth in the morning & take 2 tablets (1000 mg) by mouth in the afternoon.     metoprolol  succinate (TOPROL -XL) 25 MG 24 hr tablet Take one-half tablet by mouth at bedtime 30 tablet 3   nitroGLYCERIN  (NITROSTAT ) 0.4 MG SL tablet 1 tablet under the tongue and allow to dissolve as needed. Take every 5 minutes up to 3 times if chest pain persists Sublingual Three times a day; Duration: 30 days     potassium chloride  SA (KLOR-CON  M) 20 MEQ tablet TAKE ONE TABLET BY MOUTH ONCE A DAY 90 tablet 0   tadalafil (CIALIS) 20 MG tablet 1 tablet as needed 1-2 hours before encounter, maximum of 20mg /24hr Orally Once a day; Duration: 30 days     vitamin B-12 (CYANOCOBALAMIN ) 500 MCG tablet Take 500 mcg by mouth in the morning.     No current facility-administered medications for this visit.   Facility-Administered Medications Ordered in Other Visits  Medication  Dose Route Frequency Provider Last Rate Last Admin   technetium tetrofosmin  (TC-MYOVIEW ) injection 31.6 millicurie  31.6 millicurie Intravenous Once PRN Pietro Redell RAMAN, MD        LABS/IMAGING: No results found for this or any previous visit (from the past 48 hours). No results found.  WEIGHTS: Wt Readings from Last 3 Encounters:  09/22/24 243 lb 8 oz (110.5 kg)  09/13/24 243 lb (110.2 kg)  09/01/24 243 lb (110.2 kg)    VITALS: BP 100/66   Pulse 74   Ht 6' (1.829 m)   Wt 243 lb 8 oz (110.5 kg)   SpO2 100%   BMI 33.02 kg/m   EXAM: General appearance: alert and no distress Neck: no carotid bruit, no JVD, and thyroid  not enlarged, symmetric, no tenderness/mass/nodules Lungs: clear to auscultation bilaterally Heart: regular rate and rhythm, S1, S2 normal, no murmur, click, rub or gallop and well-healed midline scar Abdomen: soft,  non-tender; bowel sounds normal; no masses,  no organomegaly Extremities: extremities normal, atraumatic, no cyanosis or edema Pulses: 2+ and symmetric Skin: Skin color, texture, turgor normal. No rashes or lesions Neurologic: Grossly normal Psych: Pleasant  EKG: EKG Interpretation Date/Time:  Wednesday September 22 2024 16:03:18 EST Ventricular Rate:  58 PR Interval:  180 QRS Duration:  104 QT Interval:  408 QTC Calculation: 400 R Axis:   -59  Text Interpretation: Sinus bradycardia with sinus arrhythmia Left anterior fascicular block When compared with ECG of 27-Nov-2023 08:29, T wave inversion now evident in Inferior leads QT has shortened Confirmed by Mona Kent [phone]) on 09/22/2024 4:25:09 PM    ASSESSMENT: CAD status post CABG x5 with LIMA to LAD, SVG to diagonal, SVG to PDA and sequential SVG to OM 2 and OM 3 (07/16/2021) Abnormal coronary CT angiogram with calcium  score 2381, 97th percentile and three-vessel coronary disease (06/28/2021). Chest pressure, dyspnea on exertion -low risk Myoview  stress test (09/2020) Low risk Myoview  stress test and normal LVEF by echo 60-65% (2017) PVCs and palpitations Abnormal EKG with T-wave inversions History of moderate nonobstructive coronary artery disease by cath in 2002 Strong family history of premature coronary disease in his father and brother who died of MIs in their 58s Type 2 diabetes Dyslipidemia Hypertension Perioperative atrial fibrillation CKD 3B  PLAN: 1.   Mr. Gleed had recent lab work which showed an elevated creatinine however looking back it seems that his creatinine has been elevated for the past several years after bypass.  It is unclear what may be contributing to this.  He is on some diuretics which seems to have helped with his edema.  He is also on Jardiance and losartan .  I would recommend continuing that for now because of their overall benefits however and since his kidney function has been stable.  He was  counseled to avoid possible nephrotoxins.  At some point he might benefit from nephrology evaluation.  Will copy his primary care provider so that he is aware of this.  Follow-up with me in 6 months or sooner as necessary.  Kent KYM Mona, MD, [facility], FNLA, FACP  [place]  [facility]  Medical Director of the [facility]  [facility] Diplomate of the American Board of Clinical Lipidology Attending Cardiologist  Direct Dial: [phone]  Fax: [phone]  Website:  [email or web address] Kent BROCKS Jahlon Baines 09/23/2024, 10:45 AM

## 2024-11-11 ENCOUNTER — Other Ambulatory Visit: Payer: Self-pay | Admitting: Internal Medicine

## 2024-12-09 ENCOUNTER — Other Ambulatory Visit: Payer: Self-pay | Admitting: Internal Medicine
